# Patient Record
Sex: Female | Born: 1975 | Race: Black or African American | Hispanic: No | Marital: Single | Smoking: Current every day smoker
Health system: Southern US, Community
[De-identification: ages and names within clinical notes are randomized; demographics above are authoritative.]

## PROBLEM LIST (undated history)

## (undated) DIAGNOSIS — J45909 Unspecified asthma, uncomplicated: Secondary | ICD-10-CM

---

## 1997-12-26 ENCOUNTER — Ambulatory Visit (HOSPITAL_COMMUNITY): Admission: RE | Admit: 1997-12-26 | Discharge: 1997-12-26 | Payer: Self-pay | Admitting: Internal Medicine

## 2000-07-23 ENCOUNTER — Encounter: Payer: Self-pay | Admitting: Emergency Medicine

## 2000-07-23 ENCOUNTER — Emergency Department (HOSPITAL_COMMUNITY): Admission: EM | Admit: 2000-07-23 | Discharge: 2000-07-23 | Payer: Self-pay | Admitting: Emergency Medicine

## 2002-07-20 ENCOUNTER — Emergency Department (HOSPITAL_COMMUNITY): Admission: EM | Admit: 2002-07-20 | Discharge: 2002-07-20 | Payer: Self-pay | Admitting: Emergency Medicine

## 2002-10-31 ENCOUNTER — Emergency Department (HOSPITAL_COMMUNITY): Admission: EM | Admit: 2002-10-31 | Discharge: 2002-11-01 | Payer: Self-pay | Admitting: Emergency Medicine

## 2002-10-31 ENCOUNTER — Encounter: Payer: Self-pay | Admitting: Emergency Medicine

## 2003-02-03 ENCOUNTER — Ambulatory Visit (HOSPITAL_COMMUNITY): Admission: RE | Admit: 2003-02-03 | Discharge: 2003-02-03 | Payer: Self-pay | Admitting: Orthopedic Surgery

## 2003-02-03 ENCOUNTER — Encounter: Payer: Self-pay | Admitting: Orthopedic Surgery

## 2003-08-26 ENCOUNTER — Emergency Department (HOSPITAL_COMMUNITY): Admission: EM | Admit: 2003-08-26 | Discharge: 2003-08-27 | Payer: Self-pay | Admitting: Emergency Medicine

## 2004-03-16 ENCOUNTER — Emergency Department (HOSPITAL_COMMUNITY): Admission: EM | Admit: 2004-03-16 | Discharge: 2004-03-16 | Payer: Self-pay | Admitting: Emergency Medicine

## 2004-10-01 ENCOUNTER — Emergency Department (HOSPITAL_COMMUNITY): Admission: EM | Admit: 2004-10-01 | Discharge: 2004-10-01 | Payer: Self-pay | Admitting: Emergency Medicine

## 2005-09-12 ENCOUNTER — Emergency Department (HOSPITAL_COMMUNITY): Admission: EM | Admit: 2005-09-12 | Discharge: 2005-09-12 | Payer: Self-pay | Admitting: Emergency Medicine

## 2006-03-29 ENCOUNTER — Emergency Department (HOSPITAL_COMMUNITY): Admission: EM | Admit: 2006-03-29 | Discharge: 2006-03-29 | Payer: Self-pay | Admitting: Emergency Medicine

## 2006-08-07 ENCOUNTER — Emergency Department (HOSPITAL_COMMUNITY): Admission: EM | Admit: 2006-08-07 | Discharge: 2006-08-07 | Payer: Self-pay | Admitting: Emergency Medicine

## 2008-04-18 ENCOUNTER — Emergency Department (HOSPITAL_COMMUNITY): Admission: EM | Admit: 2008-04-18 | Discharge: 2008-04-19 | Payer: Self-pay | Admitting: Emergency Medicine

## 2008-07-06 ENCOUNTER — Emergency Department (HOSPITAL_COMMUNITY): Admission: EM | Admit: 2008-07-06 | Discharge: 2008-07-06 | Payer: Self-pay | Admitting: Emergency Medicine

## 2009-10-10 ENCOUNTER — Emergency Department (HOSPITAL_COMMUNITY): Admission: EM | Admit: 2009-10-10 | Discharge: 2009-10-10 | Payer: Self-pay | Admitting: Emergency Medicine

## 2010-12-12 ENCOUNTER — Ambulatory Visit (INDEPENDENT_AMBULATORY_CARE_PROVIDER_SITE_OTHER): Payer: BC Managed Care – PPO

## 2010-12-12 ENCOUNTER — Inpatient Hospital Stay (INDEPENDENT_AMBULATORY_CARE_PROVIDER_SITE_OTHER)
Admission: RE | Admit: 2010-12-12 | Discharge: 2010-12-12 | Disposition: A | Discharge status: Home / Self Care | Payer: BC Managed Care – PPO | Source: Ambulatory Visit | Attending: Emergency Medicine | Admitting: Emergency Medicine

## 2010-12-12 DIAGNOSIS — R0789 Other chest pain: Secondary | ICD-10-CM

## 2010-12-12 LAB — POCT PREGNANCY, URINE: Preg Test, Ur: NEGATIVE

## 2011-05-14 LAB — RAPID STREP SCREEN (MED CTR MEBANE ONLY): Streptococcus, Group A Screen (Direct): NEGATIVE

## 2013-04-08 ENCOUNTER — Emergency Department (HOSPITAL_COMMUNITY)
Admission: EM | Admit: 2013-04-08 | Discharge: 2013-04-09 | Disposition: A | Discharge status: Home / Self Care | Payer: BC Managed Care – PPO | Attending: Emergency Medicine | Admitting: Emergency Medicine

## 2013-04-08 DIAGNOSIS — M722 Plantar fascial fibromatosis: Secondary | ICD-10-CM

## 2013-04-08 HISTORY — DX: Unspecified asthma, uncomplicated: J45.909

## 2013-04-08 NOTE — ED Notes (Signed)
Pt. From home with complaint of pain of right foot at she woke up this evening , denied fall but broke same foot  5 years ago. No swelling nor redness noted.

## 2013-04-08 NOTE — ED Provider Notes (Signed)
CSN: [id number]     Arrival date & time 04/08/13  2335 History   First MD Initiated Contact with Patient 04/08/13 2348     Chief Complaint  Patient presents with  . Foot Pain   (Consider location/radiation/quality/duration/timing/severity/associated sxs/prior Treatment) HPI  Sharon Faulkner is a 37 y.o. female complaining of atraumatic pain to the sole of the right foot rated at 9/10 described as sharp it radiates up the leg it is exacerbated by weightbearing. She notices this pain when she first wakes up the steps foot on the ground. Patient has prior trauma to the ankle, not requiring surgery but she thinks that she broke a bone. She denies numbness, weakness, paresthesia  No past medical history on file. No past surgical history on file. No family history on file. History  Substance Use Topics  . Smoking status: Not on file  . Smokeless tobacco: Not on file  . Alcohol Use: Not on file   OB History   No data available     Review of Systems  10 systems reviewed and found to be negative, except as noted in the HPI   Allergies  Review of patient's allergies indicates not on file.  Home Medications  No current outpatient prescriptions on file. There were no vitals taken for this visit. Physical Exam  Nursing note and vitals reviewed. Constitutional: She is oriented to person, place, and time. She appears well-developed and well-nourished. No distress.  HENT:  Head: Normocephalic.  Eyes: Conjunctivae and EOM are normal.  Cardiovascular: Normal rate.   Pulmonary/Chest: Effort normal. No stridor.  Musculoskeletal: Normal range of motion.       Feet:  Neurovascularly intact  Neurological: She is alert and oriented to person, place, and time.  Psychiatric: She has a normal mood and affect.    ED Course  Procedures (including critical care time) Labs Review Labs Reviewed - No data to display Imaging Review Dg Foot Complete Right  04/09/2013   *RADIOLOGY REPORT*  Clinical  Data: Numbness and pain across metatarsals.  No known injury.  RIGHT FOOT COMPLETE - 3+ VIEW  Comparison: None.  Findings: No acute osseous abnormality such as fracture or malalignment.  No radiodense foreign body.  No focally advanced joint narrowing. Non eroded plantar calcaneal spur.  IMPRESSION: Negative right foot radiographs.   Original Report Authenticated By: Tiburcio Pea    MDM  No diagnosis found. Filed Vitals:   04/09/13 0000 04/09/13 0006 04/09/13 0101  BP:  166/109 161/102  Pulse: 85  87  Temp: 99.2 F (37.3 C)    TempSrc: Oral    Resp: 18  18  Height: 5\' 4"  (1.626 m)    Weight: 290 lb (131.543 kg)    SpO2: 100%  97%     Sharon Faulkner is a 37 y.o. female atraumatic right foot pain in the volar side. Plain film is negative, likely plantar fasciitis, counseled patient on how to do stretching exercises. Orthopedic referral given.  Medications  ibuprofen (ADVIL,MOTRIN) tablet 600 mg (600 mg Oral Given 04/09/13 0012)    Pt is hemodynamically stable, appropriate for, and amenable to discharge at this time. Pt verbalized understanding and agrees with care plan. All questions answered. Outpatient follow-up and specific return precautions discussed.    New Prescriptions   TRAMADOL (ULTRAM) 50 MG TABLET    Take 1 tablet (50 mg total) by mouth every 6 (six) hours as needed for pain.    Note: Portions of this report may have been transcribed using voice  recognition software. Every effort was made to ensure accuracy; however, inadvertent computerized transcription errors may be present      Wynetta Emery, [name] 04/09/13 0112

## 2013-04-09 ENCOUNTER — Encounter (HOSPITAL_COMMUNITY): Payer: Self-pay | Admitting: Emergency Medicine

## 2013-04-09 ENCOUNTER — Emergency Department (HOSPITAL_COMMUNITY): Payer: BC Managed Care – PPO

## 2013-04-09 MED ORDER — TRAMADOL HCL 50 MG PO TABS: 50.0000 mg | ORAL_TABLET | Freq: Four times a day (QID) | ORAL | Status: DC | PRN

## 2013-04-09 MED ORDER — IBUPROFEN 200 MG PO TABS
600.0000 mg | ORAL_TABLET | Freq: Once | ORAL | Status: AC
  Administered 2013-04-09: 600 mg via ORAL
  Filled 2013-04-09: qty 3

## 2013-04-09 NOTE — ED Provider Notes (Signed)
Medical screening examination/treatment/procedure(s) were performed by non-physician practitioner and as supervising physician I was immediately available for consultation/collaboration.  Reeve Mallo M Hanan Moen, MD 04/09/13 0618 

## 2015-11-18 DIAGNOSIS — R350 Frequency of micturition: Secondary | ICD-10-CM | POA: Diagnosis not present

## 2016-03-16 DIAGNOSIS — B349 Viral infection, unspecified: Secondary | ICD-10-CM | POA: Diagnosis not present

## 2016-05-15 DIAGNOSIS — R21 Rash and other nonspecific skin eruption: Secondary | ICD-10-CM | POA: Diagnosis not present

## 2016-05-31 DIAGNOSIS — M549 Dorsalgia, unspecified: Secondary | ICD-10-CM | POA: Diagnosis not present

## 2016-06-11 DIAGNOSIS — M62830 Muscle spasm of back: Secondary | ICD-10-CM | POA: Diagnosis not present

## 2016-08-06 DIAGNOSIS — I10 Essential (primary) hypertension: Secondary | ICD-10-CM | POA: Diagnosis not present

## 2016-08-06 DIAGNOSIS — M545 Low back pain: Secondary | ICD-10-CM | POA: Diagnosis not present

## 2016-08-06 DIAGNOSIS — G8929 Other chronic pain: Secondary | ICD-10-CM | POA: Diagnosis not present

## 2017-04-30 DIAGNOSIS — J018 Other acute sinusitis: Secondary | ICD-10-CM | POA: Diagnosis not present

## 2017-04-30 DIAGNOSIS — F17200 Nicotine dependence, unspecified, uncomplicated: Secondary | ICD-10-CM | POA: Diagnosis not present

## 2018-08-12 DIAGNOSIS — J22 Unspecified acute lower respiratory infection: Secondary | ICD-10-CM | POA: Diagnosis not present

## 2018-08-12 DIAGNOSIS — I10 Essential (primary) hypertension: Secondary | ICD-10-CM | POA: Diagnosis not present

## 2018-08-12 DIAGNOSIS — I868 Varicose veins of other specified sites: Secondary | ICD-10-CM | POA: Diagnosis not present

## 2018-08-12 DIAGNOSIS — R600 Localized edema: Secondary | ICD-10-CM | POA: Diagnosis not present

## 2018-10-24 DIAGNOSIS — J209 Acute bronchitis, unspecified: Secondary | ICD-10-CM | POA: Diagnosis not present

## 2019-02-05 DIAGNOSIS — R197 Diarrhea, unspecified: Secondary | ICD-10-CM | POA: Diagnosis not present

## 2019-02-05 DIAGNOSIS — R110 Nausea: Secondary | ICD-10-CM | POA: Diagnosis not present

## 2019-02-05 DIAGNOSIS — J4520 Mild intermittent asthma, uncomplicated: Secondary | ICD-10-CM | POA: Diagnosis not present

## 2019-02-07 DIAGNOSIS — Z20828 Contact with and (suspected) exposure to other viral communicable diseases: Secondary | ICD-10-CM | POA: Diagnosis not present

## 2020-06-20 ENCOUNTER — Emergency Department (HOSPITAL_COMMUNITY)
Admission: EM | Admit: 2020-06-20 | Discharge: 2020-06-21 | Disposition: A | Discharge status: Home / Self Care | Payer: Self-pay | Attending: Emergency Medicine | Admitting: Emergency Medicine

## 2020-06-20 DIAGNOSIS — R519 Headache, unspecified: Secondary | ICD-10-CM | POA: Insufficient documentation

## 2020-06-20 DIAGNOSIS — D75839 Thrombocytosis, unspecified: Secondary | ICD-10-CM | POA: Insufficient documentation

## 2020-06-20 DIAGNOSIS — J45909 Unspecified asthma, uncomplicated: Secondary | ICD-10-CM | POA: Insufficient documentation

## 2020-06-20 DIAGNOSIS — D509 Iron deficiency anemia, unspecified: Secondary | ICD-10-CM | POA: Insufficient documentation

## 2020-06-20 LAB — BASIC METABOLIC PANEL
Anion gap: 10 (ref 5–15)
BUN: 10 mg/dL (ref 6–20)
CO2: 24 mmol/L (ref 22–32)
Calcium: 9.2 mg/dL (ref 8.9–10.3)
Chloride: 105 mmol/L (ref 98–111)
Creatinine, Ser: 0.88 mg/dL (ref 0.44–1.00)
GFR, Estimated: >60 mL/min (ref >60)
Glucose, Bld: 104 mg/dL — ABNORMAL HIGH (ref 70–99)
Potassium: 3.8 mmol/L (ref 3.5–5.1)
Sodium: 139 mmol/L (ref 135–145)

## 2020-06-20 LAB — CBC
HCT: 30.5 % — ABNORMAL LOW (ref 36.0–46.0)
Hemoglobin: 8.4 g/dL — ABNORMAL LOW (ref 12.0–15.0)
MCH: 18.9 pg — ABNORMAL LOW (ref 26.0–34.0)
MCHC: 27.5 g/dL — ABNORMAL LOW (ref 30.0–36.0)
MCV: 68.5 fL — ABNORMAL LOW (ref 80.0–100.0)
Platelets: 430 10*3/uL — ABNORMAL HIGH (ref 150–400)
RBC: 4.45 MIL/uL (ref 3.87–5.11)
RDW: 19.8 % — ABNORMAL HIGH (ref 11.5–15.5)
WBC: 7.3 10*3/uL (ref 4.0–10.5)
nRBC: 0 % (ref 0.0–0.2)

## 2020-06-20 LAB — URINALYSIS, ROUTINE W REFLEX MICROSCOPIC
Bilirubin Urine: NEGATIVE
Glucose, UA: NEGATIVE mg/dL
Hgb urine dipstick: NEGATIVE
Ketones, ur: NEGATIVE mg/dL
Leukocytes,Ua: NEGATIVE
Nitrite: NEGATIVE
Protein, ur: NEGATIVE mg/dL
Specific Gravity, Urine: 1.023 (ref 1.005–1.030)
pH: 5 (ref 5.0–8.0)

## 2020-06-20 LAB — I-STAT BETA HCG BLOOD, ED (MC, WL, AP ONLY): I-stat hCG, quantitative: <5 m[IU]/mL (ref <5)

## 2020-06-20 MED ORDER — DEXAMETHASONE SODIUM PHOSPHATE 10 MG/ML IJ SOLN: 10.0000 mg | Freq: Once | INTRAMUSCULAR | Status: DC

## 2020-06-20 MED ORDER — DIPHENHYDRAMINE HCL 50 MG/ML IJ SOLN: 25.0000 mg | Freq: Once | INTRAMUSCULAR | Status: DC

## 2020-06-20 MED ORDER — PROCHLORPERAZINE EDISYLATE 10 MG/2ML IJ SOLN
10.0000 mg | Freq: Once | INTRAMUSCULAR | Status: AC
  Administered 2020-06-20: 10 mg via INTRAMUSCULAR
  Filled 2020-06-20: qty 2

## 2020-06-20 MED ORDER — DIPHENHYDRAMINE HCL 50 MG/ML IJ SOLN
25.0000 mg | Freq: Once | INTRAMUSCULAR | Status: AC
  Administered 2020-06-20: 25 mg via INTRAMUSCULAR
  Filled 2020-06-20: qty 1

## 2020-06-20 MED ORDER — LACTATED RINGERS IV BOLUS: 1000.0000 mL | Freq: Once | INTRAVENOUS | Status: DC

## 2020-06-20 MED ORDER — DEXAMETHASONE SODIUM PHOSPHATE 10 MG/ML IJ SOLN
10.0000 mg | Freq: Once | INTRAMUSCULAR | Status: AC
  Administered 2020-06-20: 10 mg via INTRAMUSCULAR
  Filled 2020-06-20: qty 1

## 2020-06-20 MED ORDER — PROCHLORPERAZINE EDISYLATE 10 MG/2ML IJ SOLN: 10.0000 mg | Freq: Once | INTRAMUSCULAR | Status: DC

## 2020-06-20 NOTE — ED Provider Notes (Signed)
[facility] EMERGENCY DEPARTMENT Provider Note   CSN: [id number] Arrival date & time: 06/20/20  2014   History Chief Complaint  Patient presents with  . Headache    Sharon Faulkner is a 44 y.o. female.  The history is provided by the patient.  Headache She has history of asthma and comes in complaining of a bitemporal headache.  Headache started 2 days ago and has been constant.  She describes a dull, throbbing pain which has been as severe as 10/10, but is now at 8/10.  It is worse with exposure to bright light but not to noise or smells.  She has had flashes of lights that she sees, but she denies nausea or vomiting.  There has been no numbness or weakness.  She has taken naproxen and aspirin without relief.  She had a similar headache about 2 weeks ago, but had not had similar headaches prior to that.  She denies fever or chills.  Past Medical History:  Diagnosis Date  . Asthma     There are no problems to display for this patient.   ** The histories are not reviewed yet. Please review them in the "History" navigator section and refresh this SmartLink.   OB History   No obstetric history on file.     No family history on file.  Social History   Tobacco Use  . Smoking status: Never Smoker  Substance Use Topics  . Alcohol use: Not on file    Comment: occasional  . Drug use: No    Home Medications Prior to Admission medications   Medication Sig Start Date End Date Taking? Authorizing Provider  acetaminophen (TYLENOL) 500 MG tablet Take 500 mg by mouth every 6 (six) hours as needed for pain.    [provider]  traMADol (ULTRAM) 50 MG tablet Take 1 tablet (50 mg total) by mouth every 6 (six) hours as needed for pain. 04/09/13   Pisciotta, Joni Reining, [name]    Allergies    Shellfish allergy and Codeine  Review of Systems   Review of Systems  Neurological: Positive for headaches.  All other systems reviewed and are negative.   Physical  Exam Updated Vital Signs BP (!) 153/99   Pulse 98   Temp 98.8 F (37.1 C) (Oral)   Resp 16   SpO2 99%   Physical Exam Vitals and nursing note reviewed.   44 year old female, resting comfortably and in no acute distress. Vital signs are significant for elevated blood pressure. Oxygen saturation is 99%, which is normal. Head is normocephalic and atraumatic. PERRLA, EOMI. Oropharynx is clear.  There is tenderness to palpation over the temporalis muscles bilaterally and over the insertion of the paracervical muscles bilaterally. Neck is nontender and supple without adenopathy or JVD. Back is nontender and there is no CVA tenderness. Lungs are clear without rales, wheezes, or rhonchi. Chest is nontender. Heart has regular rate and rhythm without murmur. Abdomen is soft, flat, nontender without masses or hepatosplenomegaly and peristalsis is normoactive. Extremities have no cyanosis or edema, full range of motion is present. Skin is warm and dry without rash. Neurologic: Mental status is normal, cranial nerves are intact, there are no motor or sensory deficits.  ED Results / Procedures / Treatments   Labs (all labs ordered are listed, but only abnormal results are displayed) Labs Reviewed  BASIC METABOLIC PANEL - Abnormal; Notable for the following components:      Result Value   Glucose, Bld 104 (*)  All other components within normal limits  CBC - Abnormal; Notable for the following components:   Hemoglobin 8.4 (*)    HCT 30.5 (*)    MCV 68.5 (*)    MCH 18.9 (*)    MCHC 27.5 (*)    RDW 19.8 (*)    Platelets 430 (*)    All other components within normal limits  URINALYSIS, ROUTINE W REFLEX MICROSCOPIC  I-STAT BETA HCG BLOOD, ED ([facility], [facility], [facility] ONLY)  CBG MONITORING, ED    EKG EKG Interpretation  Date/Time:  Wednesday June 20 2020 20:32:03 EST Ventricular Rate:  92 PR Interval:  156 QRS Duration: 138 QT Interval:  364 QTC Calculation: 450 R Axis:   80 Text  Interpretation: Normal sinus rhythm Right bundle branch block Abnormal ECG When compared with ECG of 12/12/2010, Right bundle branch block is now present Confirmed by Dione Booze ([id number]) on 06/20/2020 11:13:52 PM   Radiology No results found.  Procedures Procedures  Medications Ordered in ED Medications  diphenhydrAMINE (BENADRYL) injection 25 mg (25 mg Intramuscular Given 06/20/20 2351)  dexamethasone (DECADRON) injection 10 mg (10 mg Intramuscular Given 06/20/20 2350)  prochlorperazine (COMPAZINE) injection 10 mg (10 mg Intramuscular Given 06/20/20 2351)    ED Course  I have reviewed the triage vital signs and the nursing notes.  Pertinent lab results that were available during my care of the patient were reviewed by me and considered in my medical decision making (see chart for details).  MDM Rules/Calculators/A&P Headache with exam strongly suggestive of muscle contraction headache.  Possible migraine variant.  No red flags to suggest serious causes of headache such as meningitis, subarachnoid hemorrhage.  Old records are reviewed, and she has no relevant past visits.  ECG shows new right bundle branch block, but prior ECG was from 9 years ago.  Labs show microcytic anemia with no prior labs available.  Also, mild thrombocytosis of uncertain significance.  Anemia is most likely iron deficiency but also need to consider possibility of sickle cell trait.  I recommended a headache cocktail but patient states she does not wish to have an IV done.  Accordingly, she is given injections of prochlorperazine, diphenhydramine, dexamethasone and will be observed.  She had good relief of headache with above-noted treatment.  She is discharged with prescription for prochlorperazine and also for ferrous sulfate.  Advised to apply ice to her tender areas as an adjunct to medication, use over-the-counter NSAIDs and acetaminophen as needed.  Return precautions discussed.  Final Clinical Impression(s) /  ED Diagnoses Final diagnoses:  Bad headache  Microcytic anemia  Thrombocytosis    Rx / DC Orders ED Discharge Orders         Ordered    prochlorperazine (COMPAZINE) 10 MG tablet  Every 6 hours PRN        06/21/20 0100    ferrous sulfate 325 (65 FE) MG tablet  Daily        06/21/20 0100           Dione Booze, MD 06/21/20 0102

## 2020-06-20 NOTE — ED Triage Notes (Signed)
Pt states that she has had a migraine since Monday unrelieved by OTC meds. Photosensitivity, denies nausea. Pt states that she has also been having intermittent dizziness, neruo intact bilaterally

## 2020-06-21 MED ORDER — PROCHLORPERAZINE MALEATE 10 MG PO TABS: 10.0000 mg | ORAL_TABLET | Freq: Four times a day (QID) | ORAL | 0 refills | Status: DC | PRN

## 2020-06-21 MED ORDER — FERROUS SULFATE 325 (65 FE) MG PO TABS: 325.0000 mg | ORAL_TABLET | Freq: Every day | ORAL | 0 refills | Status: DC

## 2020-06-21 NOTE — Discharge Instructions (Addendum)
Apply ice to the sore areas as needed.  Take acetaminophen and ibuprofen as needed for pain.  Return if you are having any problems.

## 2020-07-16 ENCOUNTER — Encounter (HOSPITAL_COMMUNITY): Payer: Self-pay

## 2020-07-16 ENCOUNTER — Inpatient Hospital Stay (HOSPITAL_COMMUNITY)
Admission: EM | Admit: 2020-07-16 | Discharge: 2020-07-22 | DRG: 177 | Disposition: A | Discharge status: Home / Self Care | Payer: HRSA Program | Attending: Family Medicine | Admitting: Family Medicine

## 2020-07-16 ENCOUNTER — Emergency Department (HOSPITAL_COMMUNITY): Payer: HRSA Program

## 2020-07-16 ENCOUNTER — Other Ambulatory Visit: Payer: Self-pay

## 2020-07-16 DIAGNOSIS — F1210 Cannabis abuse, uncomplicated: Secondary | ICD-10-CM | POA: Diagnosis present

## 2020-07-16 DIAGNOSIS — Z91013 Allergy to seafood: Secondary | ICD-10-CM

## 2020-07-16 DIAGNOSIS — Z79899 Other long term (current) drug therapy: Secondary | ICD-10-CM | POA: Diagnosis not present

## 2020-07-16 DIAGNOSIS — I4510 Unspecified right bundle-branch block: Secondary | ICD-10-CM | POA: Diagnosis present

## 2020-07-16 DIAGNOSIS — J1282 Pneumonia due to coronavirus disease 2019: Secondary | ICD-10-CM | POA: Diagnosis present

## 2020-07-16 DIAGNOSIS — I10 Essential (primary) hypertension: Secondary | ICD-10-CM | POA: Diagnosis present

## 2020-07-16 DIAGNOSIS — F1010 Alcohol abuse, uncomplicated: Secondary | ICD-10-CM | POA: Diagnosis present

## 2020-07-16 DIAGNOSIS — D509 Iron deficiency anemia, unspecified: Secondary | ICD-10-CM

## 2020-07-16 DIAGNOSIS — F419 Anxiety disorder, unspecified: Secondary | ICD-10-CM | POA: Diagnosis present

## 2020-07-16 DIAGNOSIS — R0603 Acute respiratory distress: Secondary | ICD-10-CM | POA: Diagnosis present

## 2020-07-16 DIAGNOSIS — Z87892 Personal history of anaphylaxis: Secondary | ICD-10-CM

## 2020-07-16 DIAGNOSIS — J9601 Acute respiratory failure with hypoxia: Secondary | ICD-10-CM | POA: Diagnosis present

## 2020-07-16 DIAGNOSIS — I2699 Other pulmonary embolism without acute cor pulmonale: Secondary | ICD-10-CM | POA: Diagnosis not present

## 2020-07-16 DIAGNOSIS — J9600 Acute respiratory failure, unspecified whether with hypoxia or hypercapnia: Secondary | ICD-10-CM | POA: Diagnosis present

## 2020-07-16 DIAGNOSIS — Z91018 Allergy to other foods: Secondary | ICD-10-CM | POA: Diagnosis not present

## 2020-07-16 DIAGNOSIS — E876 Hypokalemia: Secondary | ICD-10-CM | POA: Diagnosis present

## 2020-07-16 DIAGNOSIS — R7989 Other specified abnormal findings of blood chemistry: Secondary | ICD-10-CM | POA: Diagnosis not present

## 2020-07-16 DIAGNOSIS — I361 Nonrheumatic tricuspid (valve) insufficiency: Secondary | ICD-10-CM | POA: Diagnosis not present

## 2020-07-16 DIAGNOSIS — F40240 Claustrophobia: Secondary | ICD-10-CM | POA: Diagnosis present

## 2020-07-16 DIAGNOSIS — F17210 Nicotine dependence, cigarettes, uncomplicated: Secondary | ICD-10-CM | POA: Diagnosis present

## 2020-07-16 DIAGNOSIS — Z6841 Body Mass Index (BMI) 40.0 and over, adult: Secondary | ICD-10-CM | POA: Diagnosis not present

## 2020-07-16 DIAGNOSIS — Z885 Allergy status to narcotic agent status: Secondary | ICD-10-CM

## 2020-07-16 DIAGNOSIS — U071 COVID-19: Secondary | ICD-10-CM | POA: Diagnosis present

## 2020-07-16 LAB — CBC
HCT: 32.8 % — ABNORMAL LOW (ref 36.0–46.0)
Hemoglobin: 9.1 g/dL — ABNORMAL LOW (ref 12.0–15.0)
MCH: 19 pg — ABNORMAL LOW (ref 26.0–34.0)
MCHC: 27.7 g/dL — ABNORMAL LOW (ref 30.0–36.0)
MCV: 68.3 fL — ABNORMAL LOW (ref 80.0–100.0)
Platelets: 348 10*3/uL (ref 150–400)
RBC: 4.8 MIL/uL (ref 3.87–5.11)
RDW: 21.8 % — ABNORMAL HIGH (ref 11.5–15.5)
WBC: 5.7 10*3/uL (ref 4.0–10.5)
nRBC: 0 % (ref 0.0–0.2)

## 2020-07-16 LAB — FERRITIN: Ferritin: 56 ng/mL (ref 11–307)

## 2020-07-16 LAB — I-STAT BETA HCG BLOOD, ED (MC, WL, AP ONLY): I-stat hCG, quantitative: <5 m[IU]/mL (ref <5)

## 2020-07-16 LAB — RESP PANEL BY RT-PCR (FLU A&B, COVID) ARPGX2
Influenza A by PCR: NEGATIVE
Influenza B by PCR: NEGATIVE
SARS Coronavirus 2 by RT PCR: POSITIVE — AB

## 2020-07-16 LAB — BASIC METABOLIC PANEL
Anion gap: 8 (ref 5–15)
BUN: 9 mg/dL (ref 6–20)
CO2: 29 mmol/L (ref 22–32)
Calcium: 8.4 mg/dL — ABNORMAL LOW (ref 8.9–10.3)
Chloride: 98 mmol/L (ref 98–111)
Creatinine, Ser: 0.77 mg/dL (ref 0.44–1.00)
GFR, Estimated: >60 mL/min (ref >60)
Glucose, Bld: 113 mg/dL — ABNORMAL HIGH (ref 70–99)
Potassium: 3.1 mmol/L — ABNORMAL LOW (ref 3.5–5.1)
Sodium: 135 mmol/L (ref 135–145)

## 2020-07-16 LAB — LACTATE DEHYDROGENASE: LDH: 423 U/L — ABNORMAL HIGH (ref 98–192)

## 2020-07-16 LAB — TRIGLYCERIDES: Triglycerides: 91 mg/dL (ref <150)

## 2020-07-16 LAB — FIBRINOGEN: Fibrinogen: 521 mg/dL — ABNORMAL HIGH (ref 210–475)

## 2020-07-16 LAB — PROCALCITONIN: Procalcitonin: <0.1 ng/mL

## 2020-07-16 LAB — LACTIC ACID, PLASMA
Lactic Acid, Venous: 0.9 mmol/L (ref 0.5–1.9)
Lactic Acid, Venous: 1 mmol/L (ref 0.5–1.9)

## 2020-07-16 LAB — D-DIMER, QUANTITATIVE: D-Dimer, Quant: 2.98 ug/mL-FEU — ABNORMAL HIGH (ref 0.00–0.50)

## 2020-07-16 LAB — C-REACTIVE PROTEIN: CRP: 3.9 mg/dL — ABNORMAL HIGH (ref <1.0)

## 2020-07-16 MED ORDER — ONDANSETRON HCL 4 MG PO TABS: 4.0000 mg | ORAL_TABLET | Freq: Four times a day (QID) | ORAL | Status: DC | PRN

## 2020-07-16 MED ORDER — PROCHLORPERAZINE MALEATE 10 MG PO TABS: 10.0000 mg | ORAL_TABLET | Freq: Four times a day (QID) | ORAL | Status: DC | PRN

## 2020-07-16 MED ORDER — SODIUM CHLORIDE 0.9 % IV SOLN
200.0000 mg | Freq: Once | INTRAVENOUS | Status: AC
  Administered 2020-07-16: 200 mg via INTRAVENOUS
  Filled 2020-07-16: qty 200

## 2020-07-16 MED ORDER — FERROUS SULFATE 325 (65 FE) MG PO TABS
325.0000 mg | ORAL_TABLET | Freq: Every day | ORAL | Status: DC
  Administered 2020-07-17 – 2020-07-22 (×6): 325 mg via ORAL
  Filled 2020-07-16 (×6): qty 1

## 2020-07-16 MED ORDER — SENNOSIDES-DOCUSATE SODIUM 8.6-50 MG PO TABS: 1.0000 | ORAL_TABLET | Freq: Every evening | ORAL | Status: DC | PRN

## 2020-07-16 MED ORDER — GUAIFENESIN-DM 100-10 MG/5ML PO SYRP
5.0000 mL | ORAL_SOLUTION | ORAL | Status: DC | PRN
  Administered 2020-07-16 – 2020-07-22 (×13): 5 mL via ORAL
  Filled 2020-07-16 (×13): qty 10

## 2020-07-16 MED ORDER — POTASSIUM CHLORIDE CRYS ER 20 MEQ PO TBCR
40.0000 meq | EXTENDED_RELEASE_TABLET | Freq: Two times a day (BID) | ORAL | Status: DC
  Administered 2020-07-16 – 2020-07-18 (×4): 40 meq via ORAL
  Filled 2020-07-16 (×4): qty 2

## 2020-07-16 MED ORDER — BARICITINIB 2 MG PO TABS
4.0000 mg | ORAL_TABLET | Freq: Every day | ORAL | Status: DC
  Administered 2020-07-16 – 2020-07-22 (×7): 4 mg via ORAL
  Filled 2020-07-16 (×8): qty 2

## 2020-07-16 MED ORDER — SODIUM CHLORIDE 0.9 % IV SOLN
100.0000 mg | Freq: Every day | INTRAVENOUS | Status: AC
  Administered 2020-07-17 – 2020-07-20 (×4): 100 mg via INTRAVENOUS
  Filled 2020-07-16 (×5): qty 20

## 2020-07-16 MED ORDER — IPRATROPIUM-ALBUTEROL 20-100 MCG/ACT IN AERS
1.0000 | INHALATION_SPRAY | Freq: Four times a day (QID) | RESPIRATORY_TRACT | Status: DC
  Administered 2020-07-16 – 2020-07-22 (×21): 1 via RESPIRATORY_TRACT
  Filled 2020-07-16 (×2): qty 4

## 2020-07-16 MED ORDER — METHYLPREDNISOLONE SODIUM SUCC 125 MG IJ SOLR
1.0000 mg/kg | Freq: Two times a day (BID) | INTRAMUSCULAR | Status: DC
  Administered 2020-07-16 – 2020-07-18 (×4): 113.75 mg via INTRAVENOUS
  Filled 2020-07-16 (×4): qty 2

## 2020-07-16 MED ORDER — ENOXAPARIN SODIUM 60 MG/0.6ML ~~LOC~~ SOLN
55.0000 mg | SUBCUTANEOUS | Status: DC
  Filled 2020-07-16 (×4): qty 0.6

## 2020-07-16 MED ORDER — MAGNESIUM SULFATE 2 GM/50ML IV SOLN
2.0000 g | Freq: Once | INTRAVENOUS | Status: AC
  Administered 2020-07-16: 2 g via INTRAVENOUS
  Filled 2020-07-16: qty 50

## 2020-07-16 MED ORDER — ONDANSETRON HCL 4 MG/2ML IJ SOLN: 4.0000 mg | Freq: Four times a day (QID) | INTRAMUSCULAR | Status: DC | PRN

## 2020-07-16 MED ORDER — ALBUTEROL SULFATE HFA 108 (90 BASE) MCG/ACT IN AERS
5.0000 | INHALATION_SPRAY | Freq: Once | RESPIRATORY_TRACT | Status: AC
  Administered 2020-07-16: 5 via RESPIRATORY_TRACT
  Filled 2020-07-16: qty 6.7

## 2020-07-16 MED ORDER — IPRATROPIUM BROMIDE HFA 17 MCG/ACT IN AERS
2.0000 | INHALATION_SPRAY | Freq: Once | RESPIRATORY_TRACT | Status: AC
  Administered 2020-07-16: 2 via RESPIRATORY_TRACT
  Filled 2020-07-16: qty 12.9

## 2020-07-16 MED ORDER — ACETAMINOPHEN 325 MG PO TABS
650.0000 mg | ORAL_TABLET | Freq: Four times a day (QID) | ORAL | Status: DC | PRN
  Administered 2020-07-18: 650 mg via ORAL
  Filled 2020-07-16: qty 2

## 2020-07-16 MED ORDER — PREDNISONE 20 MG PO TABS: 50.0000 mg | ORAL_TABLET | Freq: Every day | ORAL | Status: DC

## 2020-07-16 MED ORDER — METHYLPREDNISOLONE SODIUM SUCC 125 MG IJ SOLR
125.0000 mg | Freq: Once | INTRAMUSCULAR | Status: AC
  Administered 2020-07-16: 125 mg via INTRAVENOUS
  Filled 2020-07-16: qty 2

## 2020-07-16 NOTE — ED Notes (Addendum)
CRITICAL VALUE ALERT  Critical Value:  COVID positive  Date & Time Notied:  07/16/2020 1410  Provider Notified: Pilar Plate, EDP  Orders Received/Actions taken: Acknowledged

## 2020-07-16 NOTE — ED Triage Notes (Addendum)
Pt arrived via EMS , c/o SOB, cough x1 week. Went to UC, spo2 70's-80's RA. Pt audibly wheezing. SPo2 87% 4L in triage. Denies any CP, fevers, chills. Denies any sick contacts. Hx of asthma. Albuterol inhalers at home with little relief.

## 2020-07-16 NOTE — ED Provider Notes (Signed)
[facility]-EMERGENCY DEPT Provider Note   CSN: [id number] Arrival date & time: 07/16/20  1112     History Chief Complaint  Patient presents with  . Shortness of Breath    Sharon Faulkner is a 44 y.o. female with pertinent past medical history of asthma that presents the emergency department today for shortness of breath.  Patient arrived by EMS, went to urgent care today with oxygen saturation in the 70s, was sent to ER for further evaluation.  Patient states that she think she is having an asthma exacerbation, feels similar.  Patient states for the past week she has been having increased shortness of breath and increased work of breathing, also admits to cough which is productive.  She also states that she has been having some myalgias, denies any fevers, chills, nausea, vomiting, chest pain.  Denies any sick contacts.  States that she has not been vaccinated against Covid.  States that she has had to stay out of work for the past week due to her shortness of breath.  States that she has been trying to use her albuterol inhaler at home with minimal relief.  Denies any congestion, minimal sore throat after coughing, facial pain.  States that she was in normal health before this.  States that she gets asthma about 3-4 times a year, no specific trigger.  States that she is generally healthy.  Eating and drinking normally.  Is not on any oxygen at home.  HPI     Past Medical History:  Diagnosis Date  . Asthma     There are no problems to display for this patient.   History reviewed. No pertinent surgical history.   OB History   No obstetric history on file.     History reviewed. No pertinent family history.  Social History   Tobacco Use  . Smoking status: Current Every Day Smoker    Packs/day: 0.50    Types: Cigarettes  . Smokeless tobacco: Never Used  Substance Use Topics  . Alcohol use: Not on file    Comment: occasional  . Drug use: No    Home  Medications Prior to Admission medications   Medication Sig Start Date End Date Taking? Authorizing Provider  acetaminophen (TYLENOL) 500 MG tablet Take 500 mg by mouth every 6 (six) hours as needed for pain.    [provider]  ferrous sulfate 325 (65 FE) MG tablet Take 1 tablet (325 mg total) by mouth daily. 06/21/20   Dione BoozeGlick, David, MD  prochlorperazine (COMPAZINE) 10 MG tablet Take 1 tablet (10 mg total) by mouth every 6 (six) hours as needed for nausea or vomiting (or headache). 06/21/20   Dione BoozeGlick, David, MD    Allergies    Shellfish allergy, Codeine, and Mango flavor  Review of Systems   Review of Systems  Constitutional: Negative for chills, diaphoresis, fatigue and fever.  HENT: Negative for congestion, sore throat and trouble swallowing.   Eyes: Negative for pain and visual disturbance.  Respiratory: Positive for cough, shortness of breath and wheezing.   Cardiovascular: Negative for chest pain, palpitations and leg swelling.  Gastrointestinal: Negative for abdominal distention, abdominal pain, diarrhea, nausea and vomiting.  Genitourinary: Negative for difficulty urinating.  Musculoskeletal: Positive for myalgias. Negative for back pain, neck pain and neck stiffness.  Skin: Negative for pallor.  Neurological: Negative for dizziness, speech difficulty, weakness and headaches.  Psychiatric/Behavioral: Negative for confusion.    Physical Exam Updated Vital Signs BP 113/71   Pulse 89  Temp 99.4 F (37.4 C) (Oral)   Resp (!) 29   LMP 07/04/2020   SpO2 92%   Physical Exam Constitutional:      General: She is in acute distress.     Appearance: Normal appearance. She is ill-appearing. She is not toxic-appearing or diaphoretic.     Comments: Patient with respiratory distress, is able to speak to me in short sentences, satting at 93% on 4-1/2 L.  HENT:     Mouth/Throat:     Mouth: Mucous membranes are moist.     Pharynx: Oropharynx is clear.  Eyes:     General: No  scleral icterus.    Extraocular Movements: Extraocular movements intact.     Pupils: Pupils are equal, round, and reactive to light.  Cardiovascular:     Rate and Rhythm: Normal rate and regular rhythm.     Pulses: Normal pulses.     Heart sounds: Normal heart sounds.  Pulmonary:     Effort: Tachypnea and accessory muscle usage present. No respiratory distress.     Breath sounds: No stridor. Wheezing and rhonchi present. No rales.     Comments: Patient is using accessory muscles, expiratory wheezes with rhonchi heard in lower lung fields.  Increased effort.  Chest:     Chest wall: No tenderness.  Abdominal:     General: Abdomen is flat. There is no distension.     Palpations: Abdomen is soft.     Tenderness: There is no abdominal tenderness. There is no guarding or rebound.  Musculoskeletal:        General: No swelling or tenderness. Normal range of motion.     Cervical back: Normal range of motion and neck supple. No rigidity.     Right lower leg: No edema.     Left lower leg: No edema.  Skin:    General: Skin is warm and dry.     Capillary Refill: Capillary refill takes less than 2 seconds.     Coloration: Skin is not pale.  Neurological:     General: No focal deficit present.     Mental Status: She is alert and oriented to person, place, and time.  Psychiatric:        Mood and Affect: Mood normal.        Behavior: Behavior normal.     ED Results / Procedures / Treatments   Labs (all labs ordered are listed, but only abnormal results are displayed) Labs Reviewed  RESP PANEL BY RT-PCR (FLU A&B, COVID) ARPGX2 - Abnormal; Notable for the following components:      Result Value   SARS Coronavirus 2 by RT PCR POSITIVE (*)    All other components within normal limits  BASIC METABOLIC PANEL - Abnormal; Notable for the following components:   Potassium 3.1 (*)    Glucose, Bld 113 (*)    Calcium 8.4 (*)    All other components within normal limits  CBC - Abnormal; Notable for  the following components:   Hemoglobin 9.1 (*)    HCT 32.8 (*)    MCV 68.3 (*)    MCH 19.0 (*)    MCHC 27.7 (*)    RDW 21.8 (*)    All other components within normal limits  LACTIC ACID, PLASMA  LACTIC ACID, PLASMA  D-DIMER, QUANTITATIVE (NOT AT [facility])  PROCALCITONIN  LACTATE DEHYDROGENASE  FERRITIN  TRIGLYCERIDES  FIBRINOGEN  C-REACTIVE PROTEIN  I-STAT BETA HCG BLOOD, ED ([facility], [facility], [facility] ONLY)    EKG EKG Interpretation  Date/Time:  Monday July 16 2020 11:49:51 EST Ventricular Rate:  91 PR Interval:    QRS Duration: 146 QT Interval:  390 QTC Calculation: 480 R Axis:   44 Text Interpretation: Sinus rhythm Right bundle branch block Confirmed by Kennis Carina [id number]) on 07/16/2020 12:37:59 PM   Radiology DG Chest Portable 1 View  Result Date: 07/16/2020 CLINICAL DATA:  Shortness of breath.  Hypoxia. EXAM: PORTABLE CHEST 1 VIEW COMPARISON:  Chest radiographs April 23, 2015. FINDINGS: Low lung volumes with diffuse interstitial prominence and patchy airspace opacities. No visible pleural effusions or pneumothorax. Enlarged cardiac silhouette. No acute osseous abnormality. IMPRESSION: 1. Low lung volumes with diffuse interstitial prominence and patchy airspace opacities. Findings could represent pulmonary edema and/or pneumonia. 2. Cardiomegaly. Electronically Signed   By: Feliberto Harts MD   On: 07/16/2020 12:48    Procedures .Critical Care Performed by: Farrel Gordon, [name] Authorized by: Farrel Gordon, [name]   Critical care provider statement:    Critical care time (minutes):  45   Critical care was necessary to treat or prevent imminent or life-threatening deterioration of the following conditions:  Respiratory failure   Critical care was time spent personally by me on the following activities:  Discussions with consultants, evaluation of patient's response to treatment, examination of patient, ordering and performing treatments and interventions, ordering and review of  laboratory studies, ordering and review of radiographic studies, pulse oximetry, re-evaluation of patient's condition, obtaining history from patient or surrogate and review of old charts   (including critical care time)  Medications Ordered in ED Medications  methylPREDNISolone sodium succinate (SOLU-MEDROL) 125 mg/2 mL injection 125 mg (125 mg Intravenous Given 07/16/20 1238)  magnesium sulfate IVPB 2 g 50 mL ( Intravenous Stopped 07/16/20 1336)  albuterol (VENTOLIN HFA) 108 (90 Base) MCG/ACT inhaler 5 puff (5 puffs Inhalation Given 07/16/20 1238)  ipratropium (ATROVENT HFA) inhaler 2 puff (2 puffs Inhalation Given 07/16/20 1248)    ED Course  I have reviewed the triage vital signs and the nursing notes.  Pertinent labs & imaging results that were available during my care of the patient were reviewed by me and considered in my medical decision making (see chart for details).    MDM Rules/Calculators/A&P                           [place] Evora is a 44 y.o. female with pertinent past medical history of asthma that presents the emergency department today for shortness of breath.  Differential diagnoses considered include Covid, asthma exacerbation, respiratory virus.  Less likely to be PE with other URI symptoms.  Initial interventions albuterol, Atrovent, mag, will wait on DuoNeb until Covid test results.  ECG interpreted by me demonstrated no sings of ischemia.   CXR interpreted by me demonstrated edema versus pneumonia  Labs demonstrated Covid positive, patient will need to be admitted at this time for continuous increased shortness of breath and work of breathing.  216 spoke to Dr. Ronaldo Miyamoto, hospitalist will accept patient.  The patient appears reasonably stabilized for admission considering the current resources, flow, and capabilities available in the ED at this time, and I doubt any other [facility] requiring further screening and/or treatment in the ED prior to admission.  I discussed  this case with my attending physician who cosigned this note including patient's presenting symptoms, physical exam, and planned diagnostics and interventions. Attending physician stated agreement with plan or made changes to plan which were implemented.   Attending  physician assessed patient at bedside.  Final Clinical Impression(s) / ED Diagnoses Final diagnoses:  Respiratory distress  COVID    Rx / DC Orders ED Discharge Orders    None       Farrel Gordon, [name] 07/16/20 1418    Sabas Sous, MD 07/16/20 [phone]

## 2020-07-16 NOTE — H&P (Signed)
History and Physical    Sharon Faulkner QJJ:[id number] DOB: [birth year] DOA: 07/16/2020  PCP: Patient, No Pcp Per  Patient coming from: Home  Chief Complaint: Dyspnea  HPI: Sharon Faulkner is a 44 y.o. female with medical history significant of asthma. Presenting with dyspnea. Reports her symptoms started last Monday. She was having shortness of breath with an unproductive cough. No fever. She tried her inhaler and some OTC meds, but her symptoms persisted til this morning. She went to a [facility] and was assessed by their staff. She was found to be hypoxic and was sent to the ED. She denies any alleviating or aggravating factors. She denies any other treatments.  ED Course: She was found to have diffuse patchy opacities on CXR. She was found to be COVID positive. She was placed on 4L [place]. TRH was called for admission.   Review of Systems:  Denies CP, N, V, palpitations, syncopal episodes.  Review of systems is otherwise negative for all not mentioned in HPI.   PMHx Past Medical History:  Diagnosis Date  . Asthma     PSHx History reviewed. No pertinent surgical history.  SocHx  reports that she has been smoking cigarettes. She has been smoking about 0.50 packs per day. She has never used smokeless tobacco. She reports that she does not use drugs. No history on file for alcohol use.   Mj use almost daily. A fifth of EtOH 2 days out of the week.  Allergies  Allergen Reactions  . Shellfish Allergy Anaphylaxis and Itching  . Codeine Itching  . Mango Flavor     FamHx History reviewed. No pertinent family history.  Prior to Admission medications   Medication Sig Start Date End Date Taking? Authorizing Provider  acetaminophen (TYLENOL) 500 MG tablet Take 500 mg by mouth every 6 (six) hours as needed for pain.    [provider]  albuterol (VENTOLIN HFA) 108 (90 Base) MCG/ACT inhaler Inhale into the lungs. 06/18/20   [provider]  ferrous sulfate 325 (65 FE) MG  tablet Take 1 tablet (325 mg total) by mouth daily. 06/21/20   Dione Booze, MD  prochlorperazine (COMPAZINE) 10 MG tablet Take 1 tablet (10 mg total) by mouth every 6 (six) hours as needed for nausea or vomiting (or headache). 06/21/20   Dione Booze, MD    Physical Exam: Vitals:   07/16/20 1430 07/16/20 1515 07/16/20 1608 07/16/20 1610  BP: 115/70 126/79  117/73  Pulse: 88 90  90  Resp: (!) 32 (!) 40    Temp:    99.8 F (37.7 C)  TempSrc:    Oral  SpO2: 90% 91%  96%  Weight:   113.9 kg   Height:   5\' 4"  (1.626 m)     General: 44 y.o. female resting in bed in NAD Eyes: PERRL, normal sclera ENMT: Nares patent w/o discharge, orophaynx clear, dentition normal, ears w/o discharge/lesions/ulcers Neck: Supple, trachea midline Cardiovascular: RRR, +S1, S2, no m/g/r, equal pulses throughout Respiratory: scattered soft rhonchi, no w/r, normal WOB GI: BS+, NDNT, no masses noted, no organomegaly noted MSK: No e/c/c Skin: No rashes, bruises, ulcerations noted Neuro: A&O x 3, no focal deficits Psyc: Appropriate interaction and affect, calm/cooperative  Labs on Admission: I have personally reviewed following labs and imaging studies  CBC: Recent Labs  Lab 07/16/20 1138  WBC 5.7  HGB 9.1*  HCT 32.8*  MCV 68.3*  PLT 348   Basic Metabolic Panel: Recent Labs  Lab 07/16/20 1138  NA 135  K 3.1*  CL 98  CO2 29  GLUCOSE 113*  BUN 9  CREATININE 0.77  CALCIUM 8.4*   GFR: Estimated Creatinine Clearance: 109.9 mL/min (by C-G formula based on SCr of 0.77 mg/dL). Liver Function Tests: No results for input(s): AST, ALT, ALKPHOS, BILITOT, PROT, ALBUMIN in the last 168 hours. No results for input(s): LIPASE, AMYLASE in the last 168 hours. No results for input(s): AMMONIA in the last 168 hours. Coagulation Profile: No results for input(s): INR, PROTIME in the last 168 hours. Cardiac Enzymes: No results for input(s): CKTOTAL, CKMB, CKMBINDEX, TROPONINI in the last 168 hours. BNP  (last 3 results) No results for input(s): PROBNP in the last 8760 hours. HbA1C: No results for input(s): HGBA1C in the last 72 hours. CBG: No results for input(s): GLUCAP in the last 168 hours. Lipid Profile: Recent Labs    07/16/20 1138  TRIG 91   Thyroid Function Tests: No results for input(s): TSH, T4TOTAL, FREET4, T3FREE, THYROIDAB in the last 72 hours. Anemia Panel: Recent Labs    07/16/20 1138  FERRITIN 56   Urine analysis:    Component Value Date/Time   COLORURINE YELLOW 06/20/2020 2029   APPEARANCEUR CLEAR 06/20/2020 2029   LABSPEC 1.023 06/20/2020 2029   PHURINE 5.0 06/20/2020 2029   GLUCOSEU NEGATIVE 06/20/2020 2029   HGBUR NEGATIVE 06/20/2020 2029   BILIRUBINUR NEGATIVE 06/20/2020 2029   KETONESUR NEGATIVE 06/20/2020 2029   PROTEINUR NEGATIVE 06/20/2020 2029   NITRITE NEGATIVE 06/20/2020 2029   LEUKOCYTESUR NEGATIVE 06/20/2020 2029    Radiological Exams on Admission: DG Chest Portable 1 View  Result Date: 07/16/2020 CLINICAL DATA:  Shortness of breath.  Hypoxia. EXAM: PORTABLE CHEST 1 VIEW COMPARISON:  Chest radiographs April 23, 2015. FINDINGS: Low lung volumes with diffuse interstitial prominence and patchy airspace opacities. No visible pleural effusions or pneumothorax. Enlarged cardiac silhouette. No acute osseous abnormality. IMPRESSION: 1. Low lung volumes with diffuse interstitial prominence and patchy airspace opacities. Findings could represent pulmonary edema and/or pneumonia. 2. Cardiomegaly. Electronically Signed   By: Feliberto Harts MD   On: 07/16/2020 12:48   Assessment/Plan COVID 19 PNA     - admit to inpatient, med-surg     - solumedrol, remdes, baricitinib, inhalers, anti-tussives, IS, flutter     - follow inflammatory markers     - wean O2 as able  Hypokalemia     - check Mg2+     - replace K+  Morbid obesity     - lifestyle counseling; follow up outpt  Tobacco abuse EtOH abuse Mj abuse      - counseled against further use    DVT prophylaxis: lovenox  Code Status: FULL  Family Communication: None at bedside.  Consults called: None  Status is: Inpatient  Remains inpatient appropriate because:Inpatient level of care appropriate due to severity of illness   Dispo: The patient is from: Home              Anticipated d/c is to: Home              Anticipated d/c date is: 3 days              Patient currently is not medically stable to d/c.  Teddy Spike DO [facility]  If 7PM-7AM, please contact night-coverage [email or web address]  07/16/2020, 4:29 PM

## 2020-07-17 DIAGNOSIS — R0603 Acute respiratory distress: Secondary | ICD-10-CM

## 2020-07-17 DIAGNOSIS — D509 Iron deficiency anemia, unspecified: Secondary | ICD-10-CM | POA: Diagnosis not present

## 2020-07-17 DIAGNOSIS — U071 COVID-19: Secondary | ICD-10-CM | POA: Diagnosis not present

## 2020-07-17 DIAGNOSIS — J9600 Acute respiratory failure, unspecified whether with hypoxia or hypercapnia: Secondary | ICD-10-CM | POA: Diagnosis not present

## 2020-07-17 LAB — COMPREHENSIVE METABOLIC PANEL
ALT: 21 U/L (ref 0–44)
AST: 33 U/L (ref 15–41)
Albumin: 3.3 g/dL — ABNORMAL LOW (ref 3.5–5.0)
Alkaline Phosphatase: 53 U/L (ref 38–126)
Anion gap: 10 (ref 5–15)
BUN: 13 mg/dL (ref 6–20)
CO2: 27 mmol/L (ref 22–32)
Calcium: 8.7 mg/dL — ABNORMAL LOW (ref 8.9–10.3)
Chloride: 100 mmol/L (ref 98–111)
Creatinine, Ser: 0.57 mg/dL (ref 0.44–1.00)
GFR, Estimated: >60 mL/min (ref >60)
Glucose, Bld: 144 mg/dL — ABNORMAL HIGH (ref 70–99)
Potassium: 4 mmol/L (ref 3.5–5.1)
Sodium: 137 mmol/L (ref 135–145)
Total Bilirubin: 0.4 mg/dL (ref 0.3–1.2)
Total Protein: 6.8 g/dL (ref 6.5–8.1)

## 2020-07-17 LAB — MAGNESIUM: Magnesium: 2.2 mg/dL (ref 1.7–2.4)

## 2020-07-17 LAB — D-DIMER, QUANTITATIVE: D-Dimer, Quant: 2.48 ug/mL-FEU — ABNORMAL HIGH (ref 0.00–0.50)

## 2020-07-17 LAB — CBC WITH DIFFERENTIAL/PLATELET
Abs Immature Granulocytes: 0.13 10*3/uL — ABNORMAL HIGH (ref 0.00–0.07)
Basophils Absolute: 0 10*3/uL (ref 0.0–0.1)
Basophils Relative: 0 %
Eosinophils Absolute: 0 10*3/uL (ref 0.0–0.5)
Eosinophils Relative: 0 %
HCT: 32.5 % — ABNORMAL LOW (ref 36.0–46.0)
Hemoglobin: 9.1 g/dL — ABNORMAL LOW (ref 12.0–15.0)
Immature Granulocytes: 4 %
Lymphocytes Relative: 25 %
Lymphs Abs: 0.9 10*3/uL (ref 0.7–4.0)
MCH: 19.1 pg — ABNORMAL LOW (ref 26.0–34.0)
MCHC: 28 g/dL — ABNORMAL LOW (ref 30.0–36.0)
MCV: 68.3 fL — ABNORMAL LOW (ref 80.0–100.0)
Monocytes Absolute: 0.1 10*3/uL (ref 0.1–1.0)
Monocytes Relative: 4 %
Neutro Abs: 2.4 10*3/uL (ref 1.7–7.7)
Neutrophils Relative %: 67 %
Platelets: 400 10*3/uL (ref 150–400)
RBC: 4.76 MIL/uL (ref 3.87–5.11)
RDW: 21.9 % — ABNORMAL HIGH (ref 11.5–15.5)
WBC: 3.5 10*3/uL — ABNORMAL LOW (ref 4.0–10.5)
nRBC: 0 % (ref 0.0–0.2)

## 2020-07-17 LAB — C-REACTIVE PROTEIN: CRP: 3.8 mg/dL — ABNORMAL HIGH (ref <1.0)

## 2020-07-17 LAB — FERRITIN: Ferritin: 38 ng/mL (ref 11–307)

## 2020-07-17 MED ORDER — ENSURE MAX PROTEIN PO LIQD
11.0000 [oz_av] | Freq: Every day | ORAL | Status: DC
  Administered 2020-07-17 – 2020-07-22 (×6): 11 [oz_av] via ORAL

## 2020-07-17 MED ORDER — LIP MEDEX EX OINT
TOPICAL_OINTMENT | CUTANEOUS | Status: DC | PRN
  Filled 2020-07-17 (×2): qty 7

## 2020-07-17 NOTE — Progress Notes (Signed)
Initial Nutrition Assessment  DOCUMENTATION CODES:   Morbid obesity  INTERVENTION:   -Ensure MAX Protein po daily, each supplement provides 150 kcal and 30 grams of protein  NUTRITION DIAGNOSIS:   Increased nutrient needs related to acute illness (COVID-19 infection) as evidenced by estimated needs.  GOAL:   Patient will meet greater than or equal to 90% of their needs  MONITOR:   PO intake, Supplement acceptance, Labs, Weight trends, I & O's  REASON FOR ASSESSMENT:   Malnutrition Screening Tool    ASSESSMENT:   44 y.o. female past medical history significant for asthma presents with dyspnea relates the symptoms started 7 days prior to admission in the ED chest x-ray showed diffuse patchy opacities she was Covid positive and placed on 4 L of oxygen.  COVID-19 positive since 12/6.   Pt reports she was eating and drinking normally PTA. Pt consuming 100% of meals at this time.  Reports weight loss of 10 lbs over 2-3 weeks. Ordered Ensure Max for additional protein given active COVID-19 infection.  Labs reviewed. Medications: ferrous sulfate,  KLOR-CON  NUTRITION - FOCUSED PHYSICAL EXAM:  Unable to complete  Diet Order:   Diet Order            Diet regular Room service appropriate? Yes; Fluid consistency: Thin  Diet effective now                 EDUCATION NEEDS:   No education needs have been identified at this time  Skin:  Skin Assessment: Reviewed RN Assessment  Last BM:  12/6  Height:   Ht Readings from Last 1 Encounters:  07/16/20 5\' 4"  (1.626 m)    Weight:   Wt Readings from Last 1 Encounters:  07/16/20 113.9 kg    BMI:  Body mass index is 43.08 kg/m.  Estimated Nutritional Needs:   Kcal:  1800-2000  Protein:  85-100g  Fluid:  2L/day  14/06/21, MS, RD, LDN Inpatient Clinical Dietitian Contact information available via Amion

## 2020-07-17 NOTE — Progress Notes (Signed)
[facility] PROGRESS NOTE    Progress Note  Sharon Faulkner  VWU:[id number] DOB: [birth year] DOA: 07/16/2020 PCP: Patient, No Pcp Per     Brief Narrative:   Sharon Faulkner is an 44 y.o. female past medical history significant for asthma presents with dyspnea relates the symptoms started 7 days prior to admission in the ED chest x-ray showed diffuse patchy opacities she was Covid positive and placed on 4 L of oxygen.  Assessment/Plan:   Acute respiratory failure due to COVID-19 [facility]) She was started on IV remdesivir, steroids and baricitinib. Her inflammatory markers are unchanged and cont to be  elevated we will continue to follow intermittently. He is still requiring 4 L of oxygen try to keep saturations greater than 92%. Encourage out of bed to chair, when in bed encourage prone position, continue to use incentive spirometry and flutter valve.  Procalcitonin was low yield. Continue Lovenox for DVT prophylaxis.  Microcytic anemia In a menstruating female likely iron deficiency anemia.  Her ferritin will be unreliable with a new diagnosis of Covid.  Will need to be followed up as an outpatient  DVT prophylaxis: lovenox Family Communication:none Status is: Inpatient  Remains inpatient appropriate because:Hemodynamically unstable   Dispo: The patient is from: Home              Anticipated d/c is to: Home              Anticipated d/c date is: > 3 days              Patient currently is medically stable to d/c.     Code Status:     Code Status Orders  (From admission, onward)         Start     Ordered   07/16/20 1650  Full code  Continuous        07/16/20 1652        Code Status History    This patient has a current code status but no historical code status.   Advance Care Planning Activity        IV Access:    Peripheral IV   Procedures and diagnostic studies:   DG Chest Portable 1 View  Result Date: 07/16/2020 CLINICAL DATA:  Shortness of breath.   Hypoxia. EXAM: PORTABLE CHEST 1 VIEW COMPARISON:  Chest radiographs April 23, 2015. FINDINGS: Low lung volumes with diffuse interstitial prominence and patchy airspace opacities. No visible pleural effusions or pneumothorax. Enlarged cardiac silhouette. No acute osseous abnormality. IMPRESSION: 1. Low lung volumes with diffuse interstitial prominence and patchy airspace opacities. Findings could represent pulmonary edema and/or pneumonia. 2. Cardiomegaly. Electronically Signed   By: Feliberto Harts MD   On: 07/16/2020 12:48     Medical Consultants:    None.  Anti-Infectives:   Remdesivir  Subjective:    Sharon Faulkner relates that she still significantly short of breath is unchanged compared to yesterday.  Objective:    Vitals:   07/16/20 1610 07/16/20 1948 07/17/20 0102 07/17/20 0311  BP: 117/73 111/74 114/76 111/71  Pulse: 90 85 80 76  Resp: 20 (!) 24 (!) 24 (!) 21  Temp: 99.8 F (37.7 C) 98.4 F (36.9 C) 99.5 F (37.5 C) 99.1 F (37.3 C)  TempSrc: Oral Oral Oral Oral  SpO2: 96% 95% 94% 93%  Weight:      Height:       SpO2: 93 % O2 Flow Rate (L/min): 5 L/min   Intake/Output Summary (Last 24 hours) at 07/17/2020 0848  Last data filed at 07/16/2020 1900 Gross per 24 hour  Intake 531.94 ml  Output --  Net 531.94 ml   Filed Weights   07/16/20 1608  Weight: 113.9 kg    Exam: General exam: In no acute distress. Respiratory system: Good air movement and clear to auscultation. Cardiovascular system: S1 & S2 heard, RRR. No JVD. Gastrointestinal system: Abdomen is nondistended, soft and nontender.  Extremities: No pedal edema. Skin: No rashes, lesions or ulcers Psychiatry: Judgement and insight appear normal. Mood & affect appropriate.    Data Reviewed:    Labs: Basic Metabolic Panel: Recent Labs  Lab 07/16/20 1138 07/17/20 0422  NA 135 137  K 3.1* 4.0  CL 98 100  CO2 29 27  GLUCOSE 113* 144*  BUN 9 13  CREATININE 0.77 0.57  CALCIUM 8.4* 8.7*   MG  --  2.2   GFR Estimated Creatinine Clearance: 109.9 mL/min (by C-G formula based on SCr of 0.57 mg/dL). Liver Function Tests: Recent Labs  Lab 07/17/20 0422  AST 33  ALT 21  ALKPHOS 53  BILITOT 0.4  PROT 6.8  ALBUMIN 3.3*   No results for input(s): LIPASE, AMYLASE in the last 168 hours. No results for input(s): AMMONIA in the last 168 hours. Coagulation profile No results for input(s): INR, PROTIME in the last 168 hours. COVID-19 Labs  Recent Labs    07/16/20 1138 07/17/20 0422  DDIMER 2.98* 2.48*  FERRITIN 56 38  LDH 423*  --   CRP 3.9* 3.8*    Lab Results  Component Value Date   SARSCOV2NAA POSITIVE (A) 07/16/2020    CBC: Recent Labs  Lab 07/16/20 1138 07/17/20 0422  WBC 5.7 3.5*  NEUTROABS  --  2.4  HGB 9.1* 9.1*  HCT 32.8* 32.5*  MCV 68.3* 68.3*  PLT 348 400   Cardiac Enzymes: No results for input(s): CKTOTAL, CKMB, CKMBINDEX, TROPONINI in the last 168 hours. BNP (last 3 results) No results for input(s): PROBNP in the last 8760 hours. CBG: No results for input(s): GLUCAP in the last 168 hours. D-Dimer: Recent Labs    07/16/20 1138 07/17/20 0422  DDIMER 2.98* 2.48*   Hgb A1c: No results for input(s): HGBA1C in the last 72 hours. Lipid Profile: Recent Labs    07/16/20 1138  TRIG 91   Thyroid function studies: No results for input(s): TSH, T4TOTAL, T3FREE, THYROIDAB in the last 72 hours.  Invalid input(s): FREET3 Anemia work up: Recent Labs    07/16/20 1138 07/17/20 0422  FERRITIN 56 38   Sepsis Labs: Recent Labs  Lab 07/16/20 1138 07/16/20 1513 07/16/20 1624 07/17/20 0422  PROCALCITON <0.10  --   --   --   WBC 5.7  --   --  3.5*  LATICACIDVEN  --  0.9 1.0  --    Microbiology Recent Results (from the past 240 hour(s))  Resp Panel by RT-PCR (Flu A&B, Covid) Nasopharyngeal Swab     Status: Abnormal   Collection Time: 07/16/20 12:50 PM   Specimen: Nasopharyngeal Swab; Nasopharyngeal(NP) swabs in vial transport medium   Result Value Ref Range Status   SARS Coronavirus 2 by RT PCR POSITIVE (A) NEGATIVE Final    Comment: RESULT CALLED TO, READ BACK BY AND VERIFIED WITH: D,JACOB AT 1400 ON 07/16/20 BY A,MOHAMED (NOTE) SARS-CoV-2 target nucleic acids are DETECTED.  The SARS-CoV-2 RNA is generally detectable in upper respiratory specimens during the acute phase of infection. Positive results are indicative of the presence of the identified virus, but do  not rule out bacterial infection or co-infection with other pathogens not detected by the test. Clinical correlation with patient history and other diagnostic information is necessary to determine patient infection status. The expected result is Negative.  Fact Sheet for Patients: BloggerCourse.com  Fact Sheet for Healthcare Providers: SeriousBroker.it  This test is not yet approved or cleared by the [place] FDA and  has been authorized for detection and/or diagnosis of SARS-CoV-2 by FDA under an Emergency Use Authorization (EUA).  This EUA will remain in effect (meaning this test  can be used) for the duration of  the COVID-19 declaration under Section 564(b)(1) of the Act, 21 U.S.C. section 360bbb-3(b)(1), unless the authorization is terminated or revoked sooner.     Influenza A by PCR NEGATIVE NEGATIVE Final   Influenza B by PCR NEGATIVE NEGATIVE Final    Comment: (NOTE) The Xpert Xpress SARS-CoV-2/FLU/RSV plus assay is intended as an aid in the diagnosis of influenza from Nasopharyngeal swab specimens and should not be used as a sole basis for treatment. Nasal washings and aspirates are unacceptable for Xpert Xpress SARS-CoV-2/FLU/RSV testing.  Fact Sheet for Patients: BloggerCourse.com  Fact Sheet for Healthcare Providers: SeriousBroker.it  This test is not yet approved or cleared by the [place] FDA and has been authorized for  detection and/or diagnosis of SARS-CoV-2 by FDA under an Emergency Use Authorization (EUA). This EUA will remain in effect (meaning this test can be used) for the duration of the COVID-19 declaration under Section 564(b)(1) of the Act, 21 U.S.C. section 360bbb-3(b)(1), unless the authorization is terminated or revoked.  Performed at [facility], [address] [place], [place] [address]      Medications:   . baricitinib  4 mg Oral Daily  . enoxaparin (LOVENOX) injection  55 mg Subcutaneous Q24H  . ferrous sulfate  325 mg Oral Daily  . Ipratropium-Albuterol  1 puff Inhalation Q6H  . methylPREDNISolone (SOLU-MEDROL) injection  1 mg/kg Intravenous Q12H   Followed by  . [START ON 07/20/2020] predniSONE  50 mg Oral Daily  . potassium chloride  40 mEq Oral BID  . Ensure Max Protein  11 oz Oral Daily   Continuous Infusions: . remdesivir 100 mg in NS 100 mL        LOS: 1 day   Marinda Elk  [facility]  07/17/2020, 8:48 AM

## 2020-07-18 DIAGNOSIS — J9600 Acute respiratory failure, unspecified whether with hypoxia or hypercapnia: Secondary | ICD-10-CM | POA: Diagnosis not present

## 2020-07-18 DIAGNOSIS — U071 COVID-19: Principal | ICD-10-CM

## 2020-07-18 DIAGNOSIS — D509 Iron deficiency anemia, unspecified: Secondary | ICD-10-CM

## 2020-07-18 LAB — D-DIMER, QUANTITATIVE: D-Dimer, Quant: 3.2 ug/mL-FEU — ABNORMAL HIGH (ref 0.00–0.50)

## 2020-07-18 LAB — CBC WITH DIFFERENTIAL/PLATELET
Abs Immature Granulocytes: 0.11 10*3/uL — ABNORMAL HIGH (ref 0.00–0.07)
Basophils Absolute: 0 10*3/uL (ref 0.0–0.1)
Basophils Relative: 0 %
Eosinophils Absolute: 0 10*3/uL (ref 0.0–0.5)
Eosinophils Relative: 0 %
HCT: 34.9 % — ABNORMAL LOW (ref 36.0–46.0)
Hemoglobin: 9.4 g/dL — ABNORMAL LOW (ref 12.0–15.0)
Immature Granulocytes: 1 %
Lymphocytes Relative: 13 %
Lymphs Abs: 1.1 10*3/uL (ref 0.7–4.0)
MCH: 19 pg — ABNORMAL LOW (ref 26.0–34.0)
MCHC: 26.9 g/dL — ABNORMAL LOW (ref 30.0–36.0)
MCV: 70.6 fL — ABNORMAL LOW (ref 80.0–100.0)
Monocytes Absolute: 0.3 10*3/uL (ref 0.1–1.0)
Monocytes Relative: 4 %
Neutro Abs: 6.7 10*3/uL (ref 1.7–7.7)
Neutrophils Relative %: 82 %
Platelets: 396 10*3/uL (ref 150–400)
RBC: 4.94 MIL/uL (ref 3.87–5.11)
RDW: 22.5 % — ABNORMAL HIGH (ref 11.5–15.5)
WBC: 8.2 10*3/uL (ref 4.0–10.5)
nRBC: 0 % (ref 0.0–0.2)

## 2020-07-18 LAB — COMPREHENSIVE METABOLIC PANEL
ALT: 20 U/L (ref 0–44)
AST: 32 U/L (ref 15–41)
Albumin: 3.2 g/dL — ABNORMAL LOW (ref 3.5–5.0)
Alkaline Phosphatase: 56 U/L (ref 38–126)
Anion gap: 11 (ref 5–15)
BUN: 16 mg/dL (ref 6–20)
CO2: 24 mmol/L (ref 22–32)
Calcium: 8.9 mg/dL (ref 8.9–10.3)
Chloride: 105 mmol/L (ref 98–111)
Creatinine, Ser: 0.69 mg/dL (ref 0.44–1.00)
GFR, Estimated: >60 mL/min (ref >60)
Glucose, Bld: 158 mg/dL — ABNORMAL HIGH (ref 70–99)
Potassium: 4.7 mmol/L (ref 3.5–5.1)
Sodium: 140 mmol/L (ref 135–145)
Total Bilirubin: 0.5 mg/dL (ref 0.3–1.2)
Total Protein: 6.9 g/dL (ref 6.5–8.1)

## 2020-07-18 LAB — MAGNESIUM: Magnesium: 2.1 mg/dL (ref 1.7–2.4)

## 2020-07-18 LAB — FERRITIN: Ferritin: 36 ng/mL (ref 11–307)

## 2020-07-18 LAB — C-REACTIVE PROTEIN: CRP: 1.3 mg/dL — ABNORMAL HIGH (ref <1.0)

## 2020-07-18 MED ORDER — METHYLPREDNISOLONE SODIUM SUCC 125 MG IJ SOLR
60.0000 mg | Freq: Two times a day (BID) | INTRAMUSCULAR | Status: DC
  Administered 2020-07-18 – 2020-07-20 (×4): 60 mg via INTRAVENOUS
  Filled 2020-07-18 (×4): qty 2

## 2020-07-18 MED ORDER — SALINE SPRAY 0.65 % NA SOLN
1.0000 | NASAL | Status: DC | PRN
  Filled 2020-07-18: qty 44

## 2020-07-18 NOTE — Progress Notes (Signed)
PROGRESS NOTE  Sharon Faulkner  ZWC:[id number] DOB: 02-03-1976 DOA: 07/16/2020 PCP: Patient, No Pcp Per   Brief Narrative: Sharon Faulkner is a 44 y.o. female with a history of asthma who presented to the ED 12/6 with 7 days of shortness of breath found to have positive SARS-CoV-2 and diffuse patchy opacities on CXR requiring 4L O2.   Assessment & Plan: Active Problems:   Acute respiratory failure due to COVID-19 (HCC)   Microcytic anemia  Acute hypoxemic respiratory failure due to covid-19 pneumonia: SARS-CoV-2 PCR positive on 12/6.  - Wean oxygen as tolerated, currently 4L O2.  - Continue remdesivir x5 days (12/6 - 12/10) - Continue steroids. CRP improving. PCT negative.  - Continue baricitinib - Encourage OOB, IS, FV, and awake proning if able - Continue airborne, contact precautions for 21 days from positive testing. - Monitor CMP and inflammatory markers - Enoxaparin prophylactic dose. D-dimer elevated.   Microcytic anemia, suspected iron deficiency anemia: In menstruating patient. Ferritin is wnl though this is in setting of acute infection.  - Monitor. Hgb stable in 9's. - Supplement iron.  Hypokalemia:  - Stop standing supplement with level 4.7.   Morbid obesity: Estimated body mass index is 43.08 kg/m as calculated from the following:   Height as of this encounter: 5\' 4"  (1.626 m).   Weight as of this encounter: 113.9 kg.  DVT prophylaxis: Lovenox 0.5 mg/kg q24h Code Status: Full Family Communication: None at bedside.  Disposition Plan:  Status is: Inpatient  Remains inpatient appropriate because:Inpatient level of care appropriate due to severity of illness  Dispo: The patient is from: Home              Anticipated d/c is to: Home              Anticipated d/c date is: 3 days              Patient currently is not medically stable to d/c.  Consultants:   None  Procedures:   None  Antimicrobials:  Remdesivir   Subjective: Shortness of breath improving  overall, still moderate limiting exertion. Proning regularly. No chest pain except with cough. No leg swelling.   Objective: Vitals:   07/17/20 0311 07/17/20 1426 07/17/20 2005 07/18/20 0345  BP: 111/71 102/71 (!) 129/92 116/78  Pulse: 76 68 71 71  Resp: (!) 21 18 (!) 24 (!) 21  Temp: 99.1 F (37.3 C) 98.1 F (36.7 C) 98.3 F (36.8 C) 98.6 F (37 C)  TempSrc: Oral Oral Oral Oral  SpO2: 93% 96% 95% 100%  Weight:      Height:        Intake/Output Summary (Last 24 hours) at 07/18/2020 1358 Last data filed at 07/17/2020 1608 Gross per 24 hour  Intake 0.82 ml  Output --  Net 0.82 ml   Filed Weights   07/16/20 1608  Weight: 113.9 kg    Gen: 44 y.o. female in no distress Pulm: Tachypneic with good aeration throughout, some crackles at mid-lower zones bilaterally. CV: Regular rate and rhythm. No murmur, rub, or gallop. No JVD, no pitting pedal edema. GI: Abdomen soft, non-tender, non-distended, with normoactive bowel sounds. No organomegaly or masses felt. Ext: Warm, no deformities Skin: No rashes, lesions or ulcers on visualized skin Neuro: Alert and oriented. No focal neurological deficits. Psych: Judgement and insight appear normal. Mood & affect appropriate.   Data Reviewed: I have personally reviewed following labs and imaging studies  CBC: Recent Labs  Lab 07/16/20 1138 07/17/20 0422  07/18/20 0420  WBC 5.7 3.5* 8.2  NEUTROABS  --  2.4 6.7  HGB 9.1* 9.1* 9.4*  HCT 32.8* 32.5* 34.9*  MCV 68.3* 68.3* 70.6*  PLT 348 400 396   Basic Metabolic Panel: Recent Labs  Lab 07/16/20 1138 07/17/20 0422 07/18/20 0420  NA 135 137 140  K 3.1* 4.0 4.7  CL 98 100 105  CO2 29 27 24   GLUCOSE 113* 144* 158*  BUN 9 13 16   CREATININE 0.77 0.57 0.69  CALCIUM 8.4* 8.7* 8.9  MG  --  2.2 2.1   GFR: Estimated Creatinine Clearance: 109.9 mL/min (by C-G formula based on SCr of 0.69 mg/dL). Liver Function Tests: Recent Labs  Lab 07/17/20 0422 07/18/20 0420  AST 33 32  ALT  21 20  ALKPHOS 53 56  BILITOT 0.4 0.5  PROT 6.8 6.9  ALBUMIN 3.3* 3.2*   No results for input(s): LIPASE, AMYLASE in the last 168 hours. No results for input(s): AMMONIA in the last 168 hours. Coagulation Profile: No results for input(s): INR, PROTIME in the last 168 hours. Cardiac Enzymes: No results for input(s): CKTOTAL, CKMB, CKMBINDEX, TROPONINI in the last 168 hours. BNP (last 3 results) No results for input(s): PROBNP in the last 8760 hours. HbA1C: No results for input(s): HGBA1C in the last 72 hours. CBG: No results for input(s): GLUCAP in the last 168 hours. Lipid Profile: Recent Labs    07/16/20 1138  TRIG 91   Thyroid Function Tests: No results for input(s): TSH, T4TOTAL, FREET4, T3FREE, THYROIDAB in the last 72 hours. Anemia Panel: Recent Labs    07/17/20 0422 07/18/20 0420  FERRITIN 38 36   Urine analysis:    Component Value Date/Time   COLORURINE YELLOW 06/20/2020 2029   APPEARANCEUR CLEAR 06/20/2020 2029   LABSPEC 1.023 06/20/2020 2029   PHURINE 5.0 06/20/2020 2029   GLUCOSEU NEGATIVE 06/20/2020 2029   HGBUR NEGATIVE 06/20/2020 2029   BILIRUBINUR NEGATIVE 06/20/2020 2029   KETONESUR NEGATIVE 06/20/2020 2029   PROTEINUR NEGATIVE 06/20/2020 2029   NITRITE NEGATIVE 06/20/2020 2029   LEUKOCYTESUR NEGATIVE 06/20/2020 2029   Recent Results (from the past 240 hour(s))  Resp Panel by RT-PCR (Flu A&B, Covid) Nasopharyngeal Swab     Status: Abnormal   Collection Time: 07/16/20 12:50 PM   Specimen: Nasopharyngeal Swab; Nasopharyngeal(NP) swabs in vial transport medium  Result Value Ref Range Status   SARS Coronavirus 2 by RT PCR POSITIVE (A) NEGATIVE Final    Comment: RESULT CALLED TO, READ BACK BY AND VERIFIED WITH: D,JACOB AT 1400 ON 07/16/20 BY A,MOHAMED (NOTE) SARS-CoV-2 target nucleic acids are DETECTED.  The SARS-CoV-2 RNA is generally detectable in upper respiratory specimens during the acute phase of infection. Positive results are indicative of  the presence of the identified virus, but do not rule out bacterial infection or co-infection with other pathogens not detected by the test. Clinical correlation with patient history and other diagnostic information is necessary to determine patient infection status. The expected result is Negative.  Fact Sheet for Patients: 14/06/21  Fact Sheet for Healthcare Providers: 14/6/21  This test is not yet approved or cleared by the [employer] FDA and  has been authorized for detection and/or diagnosis of SARS-CoV-2 by FDA under an Emergency Use Authorization (EUA).  This EUA will remain in effect (meaning this test  can be used) for the duration of  the COVID-19 declaration under Section 564(b)(1) of the Act, 21 U.S.C. section 360bbb-3(b)(1), unless the authorization is terminated or revoked sooner.  Influenza A by PCR NEGATIVE NEGATIVE Final   Influenza B by PCR NEGATIVE NEGATIVE Final    Comment: (NOTE) The Xpert Xpress SARS-CoV-2/FLU/RSV plus assay is intended as an aid in the diagnosis of influenza from Nasopharyngeal swab specimens and should not be used as a sole basis for treatment. Nasal washings and aspirates are unacceptable for Xpert Xpress SARS-CoV-2/FLU/RSV testing.  Fact Sheet for Patients: BloggerCourse.com  Fact Sheet for Healthcare Providers: SeriousBroker.it  This test is not yet approved or cleared by the [place] FDA and has been authorized for detection and/or diagnosis of SARS-CoV-2 by FDA under an Emergency Use Authorization (EUA). This EUA will remain in effect (meaning this test can be used) for the duration of the COVID-19 declaration under Section 564(b)(1) of the Act, 21 U.S.C. section 360bbb-3(b)(1), unless the authorization is terminated or revoked.  Performed at [facility], [address] [place], [place] [address]       Radiology Studies: No results found.  Scheduled Meds: . baricitinib  4 mg Oral Daily  . enoxaparin (LOVENOX) injection  55 mg Subcutaneous Q24H  . ferrous sulfate  325 mg Oral Daily  . Ipratropium-Albuterol  1 puff Inhalation Q6H  . methylPREDNISolone (SOLU-MEDROL) injection  1 mg/kg Intravenous Q12H   Followed by  . [START ON 07/20/2020] predniSONE  50 mg Oral Daily  . potassium chloride  40 mEq Oral BID  . Ensure Max Protein  11 oz Oral Daily   Continuous Infusions: . remdesivir 100 mg in NS 100 mL 100 mg (07/18/20 1011)     LOS: 2 days   Time spent: 25 minutes.  Tyrone Nine, MD [facility] [email or web address] 07/18/2020, 1:58 PM

## 2020-07-19 DIAGNOSIS — U071 COVID-19: Secondary | ICD-10-CM | POA: Diagnosis not present

## 2020-07-19 DIAGNOSIS — D509 Iron deficiency anemia, unspecified: Secondary | ICD-10-CM | POA: Diagnosis not present

## 2020-07-19 DIAGNOSIS — J9600 Acute respiratory failure, unspecified whether with hypoxia or hypercapnia: Secondary | ICD-10-CM | POA: Diagnosis not present

## 2020-07-19 MED ORDER — OXYMETAZOLINE HCL 0.05 % NA SOLN
1.0000 | Freq: Two times a day (BID) | NASAL | Status: AC
  Administered 2020-07-19 – 2020-07-21 (×6): 1 via NASAL
  Filled 2020-07-19: qty 15

## 2020-07-19 MED ORDER — OXYMETAZOLINE HCL 0.05 % NA SOLN: 1.0000 | Freq: Two times a day (BID) | NASAL | Status: DC

## 2020-07-19 NOTE — Plan of Care (Signed)
  Problem: Health Behavior/Discharge Planning: Goal: Ability to manage health-related needs will improve Outcome: Progressing   Problem: Clinical Measurements: Goal: Will remain free from infection Outcome: Progressing Goal: Respiratory complications will improve Outcome: Progressing   Problem: Activity: Goal: Risk for activity intolerance will decrease Outcome: Progressing   

## 2020-07-19 NOTE — Progress Notes (Signed)
PROGRESS NOTE  Danyeal Akens  TKW:[id number] DOB: 09/15/1975 DOA: 07/16/2020 PCP: Patient, No Pcp Per   Brief Narrative: Christyn Gutkowski is a 44 y.o. female with a history of asthma who presented to the ED 12/6 with 7 days of shortness of breath found to have positive SARS-CoV-2 and diffuse patchy opacities on CXR requiring 4L O2.   Assessment & Plan: Active Problems:   Acute respiratory failure due to COVID-19 (HCC)   Microcytic anemia  Acute hypoxemic respiratory failure due to covid-19 pneumonia: SARS-CoV-2 PCR positive on 12/6.  - Wean oxygen as tolerated, I cut back to 2L O2 though saturations remain marginal at rest. - Nasal saline prn, afrin BID up to 3 days - Continue remdesivir x5 days (12/6 - 12/10) - Continue steroids. CRP improving. PCT negative. Recheck in AM - Continue baricitinib - Encourage OOB, IS, FV, and awake proning if able - Continue airborne, contact precautions for 21 days from positive testing. - Monitor CMP and inflammatory markers - D-dimer elevated, though pt declining lovenox.   Microcytic anemia, suspected iron deficiency anemia: In menstruating patient. Ferritin is wnl though this is in setting of acute infection.  - Monitor. Hgb stable in 9's. - Supplement iron.  Hypokalemia:  - Stop standing supplement with level 4.7.   Morbid obesity: Estimated body mass index is 43.08 kg/m as calculated from the following:   Height as of this encounter: 5\' 4"  (1.626 m).   Weight as of this encounter: 113.9 kg.  DVT prophylaxis: Lovenox 0.5 mg/kg q24h Code Status: Full Family Communication: None at bedside.  Disposition Plan:  Status is: Inpatient  Remains inpatient appropriate because:Inpatient level of care appropriate due to severity of illness  Dispo: The patient is from: Home              Anticipated d/c is to: Home              Anticipated d/c date is: 1 day              Patient currently is not medically stable to d/c.  Consultants:    None  Procedures:   None  Antimicrobials:  Remdesivir 12/6 - 12/10  Subjective: Tired, laying on belly most of the time. Took of nasal cannula because of nasal irritation/pressure in sinuses. No bleeding or chest pain reported. Shortness of breath is moderate with exertion, improved with rest.   Objective: Vitals:   07/18/20 2100 07/18/20 2300 07/19/20 0100 07/19/20 0409  BP:    (!) 151/77  Pulse:    (!) 54  Resp:    20  Temp:    98.5 F (36.9 C)  TempSrc:    Oral  SpO2: 90% 92% 93% 93%  Weight:      Height:        Intake/Output Summary (Last 24 hours) at 07/19/2020 1128 Last data filed at 07/19/2020 1022 Gross per 24 hour  Intake 594 ml  Output --  Net 594 ml   Filed Weights   07/16/20 1608  Weight: 113.9 kg    Gen: 44 y.o. female in no distress Pulm: Nonlabored but remains tachypneic, SpO2 89-91% on room air at rest. Improved aeration, no crackles. CV: Regular rate and rhythm. No murmur, rub, or gallop. No JVD, no dependent edema. GI: Abdomen soft, non-tender, non-distended, with normoactive bowel sounds.  Ext: Warm, no deformities Skin: No rashes, lesions or ulcers on visualized skin. Neuro: Alert and oriented. No focal neurological deficits. Psych: Judgement and insight appear fair. Mood euthymic &  affect congruent. Behavior is appropriate.    Data Reviewed: I have personally reviewed following labs and imaging studies  CBC: Recent Labs  Lab 07/16/20 1138 07/17/20 0422 07/18/20 0420  WBC 5.7 3.5* 8.2  NEUTROABS  --  2.4 6.7  HGB 9.1* 9.1* 9.4*  HCT 32.8* 32.5* 34.9*  MCV 68.3* 68.3* 70.6*  PLT 348 400 396   Basic Metabolic Panel: Recent Labs  Lab 07/16/20 1138 07/17/20 0422 07/18/20 0420  NA 135 137 140  K 3.1* 4.0 4.7  CL 98 100 105  CO2 29 27 24   GLUCOSE 113* 144* 158*  BUN 9 13 16   CREATININE 0.77 0.57 0.69  CALCIUM 8.4* 8.7* 8.9  MG  --  2.2 2.1   GFR: Estimated Creatinine Clearance: 109.9 mL/min (by C-G formula based on SCr of  0.69 mg/dL). Liver Function Tests: Recent Labs  Lab 07/17/20 0422 07/18/20 0420  AST 33 32  ALT 21 20  ALKPHOS 53 56  BILITOT 0.4 0.5  PROT 6.8 6.9  ALBUMIN 3.3* 3.2*   No results for input(s): LIPASE, AMYLASE in the last 168 hours. No results for input(s): AMMONIA in the last 168 hours. Coagulation Profile: No results for input(s): INR, PROTIME in the last 168 hours. Cardiac Enzymes: No results for input(s): CKTOTAL, CKMB, CKMBINDEX, TROPONINI in the last 168 hours. BNP (last 3 results) No results for input(s): PROBNP in the last 8760 hours. HbA1C: No results for input(s): HGBA1C in the last 72 hours. CBG: No results for input(s): GLUCAP in the last 168 hours. Lipid Profile: Recent Labs    07/16/20 1138  TRIG 91   Thyroid Function Tests: No results for input(s): TSH, T4TOTAL, FREET4, T3FREE, THYROIDAB in the last 72 hours. Anemia Panel: Recent Labs    07/17/20 0422 07/18/20 0420  FERRITIN 38 36   Urine analysis:    Component Value Date/Time   COLORURINE YELLOW 06/20/2020 2029   APPEARANCEUR CLEAR 06/20/2020 2029   LABSPEC 1.023 06/20/2020 2029   PHURINE 5.0 06/20/2020 2029   GLUCOSEU NEGATIVE 06/20/2020 2029   HGBUR NEGATIVE 06/20/2020 2029   BILIRUBINUR NEGATIVE 06/20/2020 2029   KETONESUR NEGATIVE 06/20/2020 2029   PROTEINUR NEGATIVE 06/20/2020 2029   NITRITE NEGATIVE 06/20/2020 2029   LEUKOCYTESUR NEGATIVE 06/20/2020 2029   Recent Results (from the past 240 hour(s))  Resp Panel by RT-PCR (Flu A&B, Covid) Nasopharyngeal Swab     Status: Abnormal   Collection Time: 07/16/20 12:50 PM   Specimen: Nasopharyngeal Swab; Nasopharyngeal(NP) swabs in vial transport medium  Result Value Ref Range Status   SARS Coronavirus 2 by RT PCR POSITIVE (A) NEGATIVE Final    Comment: RESULT CALLED TO, READ BACK BY AND VERIFIED WITH: D,JACOB AT 1400 ON 07/16/20 BY A,MOHAMED (NOTE) SARS-CoV-2 target nucleic acids are DETECTED.  The SARS-CoV-2 RNA is generally detectable  in upper respiratory specimens during the acute phase of infection. Positive results are indicative of the presence of the identified virus, but do not rule out bacterial infection or co-infection with other pathogens not detected by the test. Clinical correlation with patient history and other diagnostic information is necessary to determine patient infection status. The expected result is Negative.  Fact Sheet for Patients: 14/06/21  Fact Sheet for Healthcare Providers: 14/6/21  This test is not yet approved or cleared by the [employer] FDA and  has been authorized for detection and/or diagnosis of SARS-CoV-2 by FDA under an Emergency Use Authorization (EUA).  This EUA will remain in effect (meaning this test  can  be used) for the duration of  the COVID-19 declaration under Section 564(b)(1) of the Act, 21 U.S.C. section 360bbb-3(b)(1), unless the authorization is terminated or revoked sooner.     Influenza A by PCR NEGATIVE NEGATIVE Final   Influenza B by PCR NEGATIVE NEGATIVE Final    Comment: (NOTE) The Xpert Xpress SARS-CoV-2/FLU/RSV plus assay is intended as an aid in the diagnosis of influenza from Nasopharyngeal swab specimens and should not be used as a sole basis for treatment. Nasal washings and aspirates are unacceptable for Xpert Xpress SARS-CoV-2/FLU/RSV testing.  Fact Sheet for Patients: BloggerCourse.com  Fact Sheet for Healthcare Providers: SeriousBroker.it  This test is not yet approved or cleared by the [place] FDA and has been authorized for detection and/or diagnosis of SARS-CoV-2 by FDA under an Emergency Use Authorization (EUA). This EUA will remain in effect (meaning this test can be used) for the duration of the COVID-19 declaration under Section 564(b)(1) of the Act, 21 U.S.C. section 360bbb-3(b)(1), unless the authorization  is terminated or revoked.  Performed at [facility], [address], [place], [place] [address]       Radiology Studies: No results found.  Scheduled Meds: . baricitinib  4 mg Oral Daily  . enoxaparin (LOVENOX) injection  55 mg Subcutaneous Q24H  . ferrous sulfate  325 mg Oral Daily  . Ipratropium-Albuterol  1 puff Inhalation Q6H  . methylPREDNISolone (SOLU-MEDROL) injection  60 mg Intravenous Q12H  . Ensure Max Protein  11 oz Oral Daily   Continuous Infusions: . remdesivir 100 mg in NS 100 mL 100 mg (07/19/20 1004)     LOS: 3 days   Time spent: 25 minutes.  Tyrone Nine, MD [facility] [email or web address] 07/19/2020, 11:28 AM

## 2020-07-20 ENCOUNTER — Inpatient Hospital Stay (HOSPITAL_BASED_OUTPATIENT_CLINIC_OR_DEPARTMENT_OTHER): Payer: HRSA Program

## 2020-07-20 ENCOUNTER — Inpatient Hospital Stay (HOSPITAL_COMMUNITY): Payer: HRSA Program

## 2020-07-20 DIAGNOSIS — U071 COVID-19: Secondary | ICD-10-CM | POA: Diagnosis not present

## 2020-07-20 DIAGNOSIS — J9600 Acute respiratory failure, unspecified whether with hypoxia or hypercapnia: Secondary | ICD-10-CM | POA: Diagnosis not present

## 2020-07-20 DIAGNOSIS — R7989 Other specified abnormal findings of blood chemistry: Secondary | ICD-10-CM

## 2020-07-20 DIAGNOSIS — D509 Iron deficiency anemia, unspecified: Secondary | ICD-10-CM | POA: Diagnosis not present

## 2020-07-20 LAB — C-REACTIVE PROTEIN: CRP: 0.7 mg/dL (ref <1.0)

## 2020-07-20 LAB — COMPREHENSIVE METABOLIC PANEL
ALT: 43 U/L (ref 0–44)
AST: 36 U/L (ref 15–41)
Albumin: 3.1 g/dL — ABNORMAL LOW (ref 3.5–5.0)
Alkaline Phosphatase: 61 U/L (ref 38–126)
Anion gap: 11 (ref 5–15)
BUN: 18 mg/dL (ref 6–20)
CO2: 22 mmol/L (ref 22–32)
Calcium: 9 mg/dL (ref 8.9–10.3)
Chloride: 105 mmol/L (ref 98–111)
Creatinine, Ser: 0.77 mg/dL (ref 0.44–1.00)
GFR, Estimated: >60 mL/min (ref >60)
Glucose, Bld: 141 mg/dL — ABNORMAL HIGH (ref 70–99)
Potassium: 4.6 mmol/L (ref 3.5–5.1)
Sodium: 138 mmol/L (ref 135–145)
Total Bilirubin: 0.4 mg/dL (ref 0.3–1.2)
Total Protein: 6.6 g/dL (ref 6.5–8.1)

## 2020-07-20 LAB — D-DIMER, QUANTITATIVE: D-Dimer, Quant: 10.43 ug/mL-FEU — ABNORMAL HIGH (ref 0.00–0.50)

## 2020-07-20 MED ORDER — SODIUM CHLORIDE (PF) 0.9 % IJ SOLN
INTRAMUSCULAR | Status: AC
  Filled 2020-07-20: qty 50

## 2020-07-20 MED ORDER — RIVAROXABAN 15 MG PO TABS
15.0000 mg | ORAL_TABLET | Freq: Two times a day (BID) | ORAL | Status: DC
  Administered 2020-07-20 (×2): 15 mg via ORAL
  Filled 2020-07-20 (×2): qty 1

## 2020-07-20 MED ORDER — LORAZEPAM 2 MG/ML IJ SOLN
1.0000 mg | Freq: Once | INTRAMUSCULAR | Status: AC | PRN
  Administered 2020-07-20: 1 mg via INTRAVENOUS
  Filled 2020-07-20: qty 1

## 2020-07-20 MED ORDER — RIVAROXABAN 20 MG PO TABS: 20.0000 mg | ORAL_TABLET | Freq: Every day | ORAL | Status: DC

## 2020-07-20 MED ORDER — IOHEXOL 350 MG/ML SOLN
100.0000 mL | Freq: Once | INTRAVENOUS | Status: AC | PRN
  Administered 2020-07-20: 100 mL via INTRAVENOUS

## 2020-07-20 MED ORDER — DEXAMETHASONE 4 MG PO TABS
6.0000 mg | ORAL_TABLET | Freq: Every day | ORAL | Status: DC
  Administered 2020-07-21 – 2020-07-22 (×2): 6 mg via ORAL
  Filled 2020-07-20 (×2): qty 2

## 2020-07-20 MED ORDER — AMLODIPINE BESYLATE 5 MG PO TABS
5.0000 mg | ORAL_TABLET | Freq: Every day | ORAL | Status: DC
  Administered 2020-07-20 – 2020-07-22 (×3): 5 mg via ORAL
  Filled 2020-07-20 (×3): qty 1

## 2020-07-20 NOTE — Plan of Care (Signed)
  Problem: Clinical Measurements: Goal: Will remain free from infection Outcome: Progressing Goal: Diagnostic test results will improve Outcome: Progressing Goal: Respiratory complications will improve Outcome: Progressing   Problem: Nutrition: Goal: Adequate nutrition will be maintained Outcome: Progressing   

## 2020-07-20 NOTE — Progress Notes (Signed)
ANTICOAGULATION CONSULT NOTE   Pharmacy Consult for Heparin Indication: pulmonary embolus  Allergies  Allergen Reactions  . Shellfish Allergy Anaphylaxis and Itching  . Codeine Itching  . Mango Flavor Itching   Patient Measurements: Height: 5\' 4"  (162.6 cm) Weight: 113.9 kg (251 lb) IBW/kg (Calculated) : 54.7 Heparin Dosing Weight: 82kg  Vital Signs: Temp: 98.1 F (36.7 C) (12/10 2030) Temp Source: Oral (12/10 2030) BP: 147/67 (12/10 2030) Pulse Rate: 55 (12/10 2030)  Labs: Recent Labs    07/18/20 0420 07/20/20 0423  HGB 9.4*  --   HCT 34.9*  --   PLT 396  --   CREATININE 0.69 0.77   Estimated Creatinine Clearance: 109.9 mL/min (by C-G formula based on SCr of 0.77 mg/dL).  Medical History: Past Medical History:  Diagnosis Date  . Asthma     Medications:  Scheduled:  . amLODipine  5 mg Oral Daily  . baricitinib  4 mg Oral Daily  . dexamethasone  6 mg Oral Daily  . ferrous sulfate  325 mg Oral Daily  . Ipratropium-Albuterol  1 puff Inhalation Q6H  . oxymetazoline  1 spray Each Nare BID  . Ensure Max Protein  11 oz Oral Daily  . Rivaroxaban  15 mg Oral BID WC   Followed by  . [START ON 08/10/2020] rivaroxaban  20 mg Oral Q supper  . sodium chloride (PF)       Infusions:    Assessment: With CTangio pending, MD ordered empiric treatment dose Xarelto (pt was refusing injections - Lovenox order d/c), patient received two doses Xarelto 15mg . Treatment changed to IV Heparin - will start 12/11 at 0800 (when next Xarelto was due) Note that Heparin level will be elevated due to Xarelto, will titrate Heparin based on aPTT until Xarelto effect dissipates.  Goal of Therapy:  Heparin level 0.3-0.7 units/ml  APTT 66-102 sec Monitor platelets by anticoagulation protocol: Yes   Plan:  Discontinued Xarelto Begin Heparin infusion at 1200 units/hr at 0800 (12/11), no bolus Obtain Baseline Heparin level - will be elevated with Xarelto  Order 1st Hep level and aPTT  tomorrow after start of Heparin infusion  14/11 PharmD 07/20/2020,11:48 PM

## 2020-07-20 NOTE — Progress Notes (Addendum)
Pt. Has been refusing Lovenox injection, MD was notified. Xarelto started p.o. Continued to educate patient re importance of adherence to treatment plan. Will continue to monitor.

## 2020-07-20 NOTE — Progress Notes (Addendum)
PROGRESS NOTE  Sharon Faulkner  ZOX:[id number] DOB: 06/22/1976 DOA: 07/16/2020 PCP: Patient, No Pcp Per   Brief Narrative: Sharon Faulkner is a 44 y.o. female with a history of asthma who presented to the ED 12/6 with 7 days of shortness of breath found to have positive SARS-CoV-2 and diffuse patchy opacities on CXR requiring 4L O2.   Assessment & Plan: Active Problems:   Acute respiratory failure due to COVID-19 (HCC)   Microcytic anemia  Acute hypoxemic respiratory failure due to covid-19 pneumonia: SARS-CoV-2 PCR positive on 12/6.  - Wean oxygen as tolerated, making improvement.  - Though no chest pain and hypoxia is not worsened, the patient has multiple risk factors for DVT/PE including obesity, hospitalization, covid and has been refusing prophylactic anticoagulation. D-dimer now >10. Lower extremity venous U/S did not reveal DVT. Will check CTA chest. Give empiric po anticoagulation (prefers to avoid injections) - Nasal saline prn, afrin BID up to 3 days - Continue remdesivir x5 days (12/6 - 12/10) - Continue steroids. CRP normalized, PCT was negative. Will taper steroids given her anxiety.  - Continue baricitinib - Encourage OOB, IS, FV, and awake proning if able - Continue airborne, contact precautions for 21 days from positive testing. - Monitor CMP and inflammatory markers  RBBB: Noted on prior ECG. Stable.   Microcytic anemia, suspected iron deficiency anemia: In menstruating patient. Ferritin is wnl though this is in setting of acute infection.  - Monitor. Hgb stable in 9's. - Supplement iron.  Hypokalemia:  - Resolved.  HTN:  - Start norvasc.  Anxiety, claustrophobia:  - prn ativan prior to CT scan.   Morbid obesity: Estimated body mass index is 43.08 kg/m as calculated from the following:   Height as of this encounter: 5\' 4"  (1.626 m).   Weight as of this encounter: 113.9 kg.  DVT prophylaxis: Xarelto Code Status: Full Family Communication: None at bedside.   Disposition Plan:  Status is: Inpatient  Remains inpatient appropriate because:Inpatient level of care appropriate due to severity of illness  Dispo: The patient is from: Home              Anticipated d/c is to: Home              Anticipated d/c date is: 1 day              Patient currently is not medically stable to d/c.  Consultants:   None  Procedures:   None  Antimicrobials:  Remdesivir 12/6 - 12/10  Subjective: Wants to take a shower. Eating better. Says shortness of breath is no better today, generally limiting mobility including getting OOB. No chest pain, legs are sore but not acutely so, and not asymmetric. No swelling. No hx VTE.  Objective: Vitals:   07/19/20 2051 07/20/20 0439 07/20/20 0627 07/20/20 0731  BP: (!) 145/82 (!) 173/109 (!) 187/97 (!) 160/108  Pulse: 60 (!) 58 (!) 54 (!) 53  Resp: 18 18    Temp: 98.6 F (37 C) 98.1 F (36.7 C)    TempSrc: Oral Oral    SpO2: 99% 93%    Weight:      Height:        Intake/Output Summary (Last 24 hours) at 07/20/2020 1056 Last data filed at 07/19/2020 2045 Gross per 24 hour  Intake 594 ml  Output --  Net 594 ml   Filed Weights   07/16/20 1608  Weight: 113.9 kg   Gen: 44 y.o. female in no distress Pulm: Mildly labored  when sitting up on EOB without oxygen on, SpO2 88-92%. Clear. CV: Regular borderline bradycardia. No murmur, rub, or gallop. No JVD, no pitting dependent edema. GI: Abdomen soft, non-tender, non-distended, with normoactive bowel sounds.  Ext: Warm, no deformities Skin: No rashes, lesions or ulcers on visualized skin. Neuro: Alert and oriented. No focal neurological deficits. Psych: Judgement and insight appear fair. Mood euthymic & affect congruent. Behavior is appropriate.    Data Reviewed: I have personally reviewed following labs and imaging studies  CBC: Recent Labs  Lab 07/16/20 1138 07/17/20 0422 07/18/20 0420  WBC 5.7 3.5* 8.2  NEUTROABS  --  2.4 6.7  HGB 9.1* 9.1* 9.4*   HCT 32.8* 32.5* 34.9*  MCV 68.3* 68.3* 70.6*  PLT 348 400 396   Basic Metabolic Panel: Recent Labs  Lab 07/16/20 1138 07/17/20 0422 07/18/20 0420 07/20/20 0423  NA 135 137 140 138  K 3.1* 4.0 4.7 4.6  CL 98 100 105 105  CO2 GLUCOSE 113* 144* 158* 141*  BUN CREATININE 0.77 0.57 0.69 0.77  CALCIUM 8.4* 8.7* 8.9 9.0  MG  --  2.2 2.1  --    GFR: Estimated Creatinine Clearance: 109.9 mL/min (by C-G formula based on SCr of 0.77 mg/dL). Liver Function Tests: Recent Labs  Lab 07/17/20 0422 07/18/20 0420 07/20/20 0423  AST 33 32 36  ALT 21 20 43  ALKPHOS 53 56 61  BILITOT 0.4 0.5 0.4  PROT 6.8 6.9 6.6  ALBUMIN 3.3* 3.2* 3.1*   No results for input(s): LIPASE, AMYLASE in the last 168 hours. No results for input(s): AMMONIA in the last 168 hours. Coagulation Profile: No results for input(s): INR, PROTIME in the last 168 hours. Cardiac Enzymes: No results for input(s): CKTOTAL, CKMB, CKMBINDEX, TROPONINI in the last 168 hours. BNP (last 3 results) No results for input(s): PROBNP in the last 8760 hours. HbA1C: No results for input(s): HGBA1C in the last 72 hours. CBG: No results for input(s): GLUCAP in the last 168 hours. Lipid Profile: No results for input(s): CHOL, HDL, LDLCALC, TRIG, CHOLHDL, LDLDIRECT in the last 72 hours. Thyroid Function Tests: No results for input(s): TSH, T4TOTAL, FREET4, T3FREE, THYROIDAB in the last 72 hours. Anemia Panel: Recent Labs    07/18/20 0420  FERRITIN 36   Urine analysis:    Component Value Date/Time   COLORURINE YELLOW 06/20/2020 2029   APPEARANCEUR CLEAR 06/20/2020 2029   LABSPEC 1.023 06/20/2020 2029   PHURINE 5.0 06/20/2020 2029   GLUCOSEU NEGATIVE 06/20/2020 2029   HGBUR NEGATIVE 06/20/2020 2029   BILIRUBINUR NEGATIVE 06/20/2020 2029   KETONESUR NEGATIVE 06/20/2020 2029   PROTEINUR NEGATIVE 06/20/2020 2029   NITRITE NEGATIVE 06/20/2020 2029   LEUKOCYTESUR NEGATIVE 06/20/2020 2029   Recent  Results (from the past 240 hour(s))  Resp Panel by RT-PCR (Flu A&B, Covid) Nasopharyngeal Swab     Status: Abnormal   Collection Time: 07/16/20 12:50 PM   Specimen: Nasopharyngeal Swab; Nasopharyngeal(NP) swabs in vial transport medium  Result Value Ref Range Status   SARS Coronavirus 2 by RT PCR POSITIVE (A) NEGATIVE Final    Comment: RESULT CALLED TO, READ BACK BY AND VERIFIED WITH: D,JACOB AT 1400 ON 07/16/20 BY A,MOHAMED (NOTE) SARS-CoV-2 target nucleic acids are DETECTED.  The SARS-CoV-2 RNA is generally detectable in upper respiratory specimens during the acute phase of infection. Positive results are indicative of the presence of the identified virus, but do not rule out bacterial infection or co-infection  with other pathogens not detected by the test. Clinical correlation with patient history and other diagnostic information is necessary to determine patient infection status. The expected result is Negative.  Fact Sheet for Patients: BloggerCourse.com  Fact Sheet for Healthcare Providers: SeriousBroker.it  This test is not yet approved or cleared by the [place] FDA and  has been authorized for detection and/or diagnosis of SARS-CoV-2 by FDA under an Emergency Use Authorization (EUA).  This EUA will remain in effect (meaning this test  can be used) for the duration of  the COVID-19 declaration under Section 564(b)(1) of the Act, 21 U.S.C. section 360bbb-3(b)(1), unless the authorization is terminated or revoked sooner.     Influenza A by PCR NEGATIVE NEGATIVE Final   Influenza B by PCR NEGATIVE NEGATIVE Final    Comment: (NOTE) The Xpert Xpress SARS-CoV-2/FLU/RSV plus assay is intended as an aid in the diagnosis of influenza from Nasopharyngeal swab specimens and should not be used as a sole basis for treatment. Nasal washings and aspirates are unacceptable for Xpert Xpress SARS-CoV-2/FLU/RSV testing.  Fact Sheet  for Patients: BloggerCourse.com  Fact Sheet for Healthcare Providers: SeriousBroker.it  This test is not yet approved or cleared by the [place] FDA and has been authorized for detection and/or diagnosis of SARS-CoV-2 by FDA under an Emergency Use Authorization (EUA). This EUA will remain in effect (meaning this test can be used) for the duration of the COVID-19 declaration under Section 564(b)(1) of the Act, 21 U.S.C. section 360bbb-3(b)(1), unless the authorization is terminated or revoked.  Performed at [facility], [address], [place], [place] [address]       Radiology Studies: VAS [place] LOWER EXTREMITY VENOUS (DVT)  Result Date: 07/20/2020  Lower Venous DVT Study Indications: Elevated d-dimer, Covid-19, tenderness in legs.  Anticoagulation: Patient refusing. Limitations: Patient unable to tolerate compressions at some segments. Involuntary patient movement. Comparison Study: No prior studies. Performing Technologist: Jean Rosenthal RDMS  Examination Guidelines: A complete evaluation includes B-mode imaging, spectral Doppler, color Doppler, and power Doppler as needed of all accessible portions of each vessel. Bilateral testing is considered an integral part of a complete examination. Limited examinations for reoccurring indications may be performed as noted. The reflux portion of the exam is performed with the patient in reverse Trendelenburg.  +---------+---------------+---------+-----------+----------+---------------+ RIGHT    CompressibilityPhasicitySpontaneityPropertiesThrombus Aging  +---------+---------------+---------+-----------+----------+---------------+ CFV      Full           Yes      Yes                                  +---------+---------------+---------+-----------+----------+---------------+ SFJ      Full                                                          +---------+---------------+---------+-----------+----------+---------------+ FV Prox  Full                                                         +---------+---------------+---------+-----------+----------+---------------+ FV Mid   Full  Yes      Yes                                  +---------+---------------+---------+-----------+----------+---------------+ FV Distal               Yes      Yes                                  +---------+---------------+---------+-----------+----------+---------------+ PFV      Full                                                         +---------+---------------+---------+-----------+----------+---------------+ POP      Full           Yes      Yes                                  +---------+---------------+---------+-----------+----------+---------------+ PTV      Full                                                         +---------+---------------+---------+-----------+----------+---------------+ PERO                                                  Patent by color +---------+---------------+---------+-----------+----------+---------------+ Gastroc  Full                                                         +---------+---------------+---------+-----------+----------+---------------+   +---------+---------------+---------+-----------+----------+--------------+ LEFT     CompressibilityPhasicitySpontaneityPropertiesThrombus Aging +---------+---------------+---------+-----------+----------+--------------+ CFV      Full           Yes      Yes                                 +---------+---------------+---------+-----------+----------+--------------+ SFJ      Full                                                        +---------+---------------+---------+-----------+----------+--------------+ FV Prox  Full                                                         +---------+---------------+---------+-----------+----------+--------------+ FV Mid   Full  Yes      Yes                                 +---------+---------------+---------+-----------+----------+--------------+ FV Distal               Yes      Yes                                 +---------+---------------+---------+-----------+----------+--------------+ PFV      Full                                                        +---------+---------------+---------+-----------+----------+--------------+ POP      Full           Yes      Yes                                 +---------+---------------+---------+-----------+----------+--------------+ PTV      Full                                                        +---------+---------------+---------+-----------+----------+--------------+ PERO     Full                                                        +---------+---------------+---------+-----------+----------+--------------+ Gastroc  Full                                                        +---------+---------------+---------+-----------+----------+--------------+     Summary: RIGHT: - There is no evidence of deep vein thrombosis in the lower extremity. However, portions of this examination were limited- see technologist comments above.  - No cystic structure found in the popliteal fossa.  LEFT: - There is no evidence of deep vein thrombosis in the lower extremity. However, portions of this examination were limited- see technologist comments above.  - No cystic structure found in the popliteal fossa.  *See table(s) above for measurements and observations.    Preliminary     Scheduled Meds: . baricitinib  4 mg Oral Daily  . enoxaparin (LOVENOX) injection  55 mg Subcutaneous Q24H  . ferrous sulfate  325 mg Oral Daily  . Ipratropium-Albuterol  1 puff Inhalation Q6H  . methylPREDNISolone (SOLU-MEDROL) injection  60 mg Intravenous Q12H  . oxymetazoline   1 spray Each Nare BID  . Ensure Max Protein  11 oz Oral Daily   Continuous Infusions: . remdesivir 100 mg in NS 100 mL 100 mg (07/19/20 1004)     LOS: 4 days   Time spent: 35 minutes.  Tyrone Nine, MD [facility] [email or web address] 07/20/2020, 10:56 AM

## 2020-07-20 NOTE — Progress Notes (Incomplete)
ANTICOAGULATION CONSULT NOTE   Pharmacy Consult for Heparin Indication: pulmonary embolus  Allergies  Allergen Reactions  . Shellfish Allergy Anaphylaxis and Itching  . Codeine Itching  . Mango Flavor Itching   Patient Measurements: Height: 5\' 4"  (162.6 cm) Weight: 113.9 kg (251 lb) IBW/kg (Calculated) : 54.7 Heparin Dosing Weight: 82kg  Vital Signs: Temp: 98.1 F (36.7 C) (12/10 2030) Temp Source: Oral (12/10 2030) BP: 147/67 (12/10 2030) Pulse Rate: 55 (12/10 2030)  Labs: Recent Labs    07/18/20 0420 07/20/20 0423  HGB 9.4*  --   HCT 34.9*  --   PLT 396  --   CREATININE 0.69 0.77   Estimated Creatinine Clearance: 109.9 mL/min (by C-G formula based on SCr of 0.77 mg/dL).  Medical History: Past Medical History:  Diagnosis Date  . Asthma     Medications:  Scheduled:  . amLODipine  5 mg Oral Daily  . baricitinib  4 mg Oral Daily  . dexamethasone  6 mg Oral Daily  . ferrous sulfate  325 mg Oral Daily  . Ipratropium-Albuterol  1 puff Inhalation Q6H  . oxymetazoline  1 spray Each Nare BID  . Ensure Max Protein  11 oz Oral Daily  . Rivaroxaban  15 mg Oral BID WC   Followed by  . [START ON 08/10/2020] rivaroxaban  20 mg Oral Q supper  . sodium chloride (PF)       Infusions:    Assessment: With CTangio pending, MD ordered empiric treatment dose Xarelto (pt was refusing injections - Lovenox order d/c), patient received two doses Xarelto 15mg . Treatment changed to IV Heparin - will start 12/11 at 0800 (when next Xarelto was due)  Goal of Therapy:  Heparin level 0.3-0.7 units/ml  APTT 66-102 sec Monitor platelets by anticoagulation protocol: Yes   Plan:  Discontinued Xarelto  Rabia Argote L 07/20/2020,11:48 PM

## 2020-07-20 NOTE — Progress Notes (Signed)
   07/20/20 0731  Vitals  BP (!) 160/108  MAP (mmHg) 122  BP Method Automatic  Pulse Rate (!) 53  MEWS COLOR  MEWS Score Color Green  MEWS Score  MEWS Temp 0  MEWS Systolic 0  MEWS Pulse 0  MEWS RR 0  MEWS LOC 0  MEWS Score 0   Previous BP at 0630 180/97 Pulse - 54. MD was notified. No other clinical complaints. Pt. Monitored closely.

## 2020-07-20 NOTE — Progress Notes (Signed)
Lower extremity venous bilateral study completed.  Preliminary results relayed to Victorino Dike, RN.   See CV Proc for preliminary results report.   Jean Rosenthal, RDMS

## 2020-07-21 ENCOUNTER — Inpatient Hospital Stay (HOSPITAL_COMMUNITY): Payer: HRSA Program

## 2020-07-21 DIAGNOSIS — U071 COVID-19: Secondary | ICD-10-CM | POA: Diagnosis not present

## 2020-07-21 DIAGNOSIS — D509 Iron deficiency anemia, unspecified: Secondary | ICD-10-CM | POA: Diagnosis not present

## 2020-07-21 DIAGNOSIS — J9601 Acute respiratory failure with hypoxia: Secondary | ICD-10-CM

## 2020-07-21 DIAGNOSIS — I361 Nonrheumatic tricuspid (valve) insufficiency: Secondary | ICD-10-CM

## 2020-07-21 DIAGNOSIS — J9600 Acute respiratory failure, unspecified whether with hypoxia or hypercapnia: Secondary | ICD-10-CM | POA: Diagnosis not present

## 2020-07-21 LAB — RENAL FUNCTION PANEL
Albumin: 3 g/dL — ABNORMAL LOW (ref 3.5–5.0)
Anion gap: 11 (ref 5–15)
BUN: 19 mg/dL (ref 6–20)
CO2: 25 mmol/L (ref 22–32)
Calcium: 8.6 mg/dL — ABNORMAL LOW (ref 8.9–10.3)
Chloride: 103 mmol/L (ref 98–111)
Creatinine, Ser: 0.86 mg/dL (ref 0.44–1.00)
GFR, Estimated: >60 mL/min (ref >60)
Glucose, Bld: 129 mg/dL — ABNORMAL HIGH (ref 70–99)
Phosphorus: 3.9 mg/dL (ref 2.5–4.6)
Potassium: 3.7 mmol/L (ref 3.5–5.1)
Sodium: 139 mmol/L (ref 135–145)

## 2020-07-21 LAB — ECHOCARDIOGRAM COMPLETE
Area-P 1/2: 2.56 cm2
Height: 64 in
S' Lateral: 2.7 cm
Weight: 4016.01 oz

## 2020-07-21 LAB — APTT: aPTT: 42 seconds — ABNORMAL HIGH (ref 24–36)

## 2020-07-21 LAB — HEPARIN LEVEL (UNFRACTIONATED)
Heparin Unfractionated: 1.86 IU/mL — ABNORMAL HIGH (ref 0.30–0.70)
Heparin Unfractionated: 0.44 IU/mL (ref 0.30–0.70)

## 2020-07-21 LAB — D-DIMER, QUANTITATIVE: D-Dimer, Quant: 8.4 ug/mL-FEU — ABNORMAL HIGH (ref 0.00–0.50)

## 2020-07-21 MED ORDER — HEPARIN (PORCINE) 25000 UT/250ML-% IV SOLN: 1200.0000 [IU]/h | INTRAVENOUS | Status: DC

## 2020-07-21 MED ORDER — HEPARIN BOLUS VIA INFUSION
3000.0000 [IU] | Freq: Once | INTRAVENOUS | Status: AC
  Administered 2020-07-21: 21:00:00 3000 [IU] via INTRAVENOUS
  Filled 2020-07-21: qty 3000

## 2020-07-21 MED ORDER — HEPARIN (PORCINE) 25000 UT/250ML-% IV SOLN
1500.0000 [IU]/h | INTRAVENOUS | Status: DC
  Administered 2020-07-21: 12:00:00 1200 [IU]/h via INTRAVENOUS
  Administered 2020-07-22: 02:00:00 1500 [IU]/h via INTRAVENOUS
  Filled 2020-07-21 (×2): qty 250

## 2020-07-21 NOTE — Progress Notes (Signed)
  Echocardiogram 2D Echocardiogram has been performed.  Sharon Faulkner 07/21/2020, 9:44 AM

## 2020-07-21 NOTE — Progress Notes (Signed)
Patient called nurse to room. Patient stated she had a raised bump on her hand and it's tingling after lab drew labs this evening. Provided patient with ice. Will continue to monitor.

## 2020-07-21 NOTE — Progress Notes (Signed)
ANTICOAGULATION CONSULT NOTE   Pharmacy Consult for Heparin Indication: pulmonary embolus  Allergies  Allergen Reactions  . Shellfish Allergy Anaphylaxis and Itching  . Codeine Itching  . Mango Flavor Itching   Patient Measurements: Height: 5\' 4"  (162.6 cm) Weight: 113.9 kg (251 lb) IBW/kg (Calculated) : 54.7 Heparin Dosing Weight: 82kg  Vital Signs: Temp: 98.9 F (37.2 C) (12/11 1438) Temp Source: Oral (12/11 1438) BP: 125/87 (12/11 1438) Pulse Rate: 86 (12/11 1438)  Labs: Recent Labs    07/20/20 0423 07/21/20 0442 07/21/20 1820  APTT  --   --  42*  HEPARINUNFRC  --  1.86* 0.44  CREATININE 0.77 0.86  --    Estimated Creatinine Clearance: 102.2 mL/min (by C-G formula based on SCr of 0.86 mg/dL).  Medical History: Past Medical History:  Diagnosis Date  . Asthma     Medications:  Scheduled:  . amLODipine  5 mg Oral Daily  . baricitinib  4 mg Oral Daily  . dexamethasone  6 mg Oral Daily  . ferrous sulfate  325 mg Oral Daily  . Ipratropium-Albuterol  1 puff Inhalation Q6H  . oxymetazoline  1 spray Each Nare BID  . Ensure Max Protein  11 oz Oral Daily   Infusions:  . heparin 1,200 Units/hr (07/21/20 1224)    Assessment: With CTangio pending, MD ordered empiric treatment dose Xarelto (pt was refusing injections - Lovenox order d/c), patient received two doses Xarelto 15mg . Treatment changed to IV Heparin - will start 12/11 at 0800 (when next Xarelto was due). Note that Heparin level will be elevated due to Xarelto, will titrate Heparin based on aPTT until Xarelto effect dissipates.  Goal of Therapy:  Heparin level 0.3-0.7 units/ml  APTT 66-102 sec Monitor platelets by anticoagulation protocol: Yes   Plan:   Heparin bolus 3000 units IV x 1  Increase heparin infusion to 1500 units/hr  Daily CBC and heparin level; aPTT as needed until DOAC effects dissipate  Monitor for s/s bleeding or worsening thrombosis  Ayssa Bentivegna A PharmD 07/21/2020,7:15  PM

## 2020-07-21 NOTE — Progress Notes (Signed)
PROGRESS NOTE  Sharon Faulkner  WLN:[id number] DOB: [birth year] DOA: 07/16/2020 PCP: Patient, No Pcp Per   Brief Narrative: Sharon Faulkner is a 44 y.o. female with a history of asthma who presented to the ED 12/6 with 7 days of shortness of breath found to have positive SARS-CoV-2 and diffuse patchy opacities on CXR requiring 4L O2.   Assessment & Plan: Active Problems:   Acute respiratory failure due to COVID-19 (HCC)   Microcytic anemia  Acute hypoxemic respiratory failure due to covid-19 pneumonia and RUL acute pulmonary embolism: SARS-CoV-2 PCR positive on 12/6.  - Wean oxygen as tolerated - Continue IV heparin, transition to DOAC in 24-48 hours if hemodynamically stable and pending echocardiogram (performed, pending).  - Nasal saline prn, afrin BID up to 3 days - Completed remdesivir, (12/6 - 12/10) - Continue steroid taper. CRP normalized. - Continue baricitinib. - Encourage OOB, IS, FV, and awake proning if able - Continue airborne, contact precautions for 21 days from positive testing. - Monitor CMP and inflammatory markers  RBBB: Noted on prior ECG. Stable.   Microcytic anemia, suspected iron deficiency anemia: In menstruating patient. Ferritin is wnl though this is in setting of acute infection.  - Recheck in AM. Hgb stable in 9's. - Supplement iron.  Hypokalemia:  - Resolved.  HTN:  - Started norvasc.  Anxiety, claustrophobia: Stable  Morbid obesity: Estimated body mass index is 43.08 kg/m as calculated from the following:   Height as of this encounter: 5\' 4"  (1.626 m).   Weight as of this encounter: 113.9 kg.  DVT prophylaxis: IV heparin.  Code Status: Full Family Communication: None at bedside.  Disposition Plan:  Status is: Inpatient  Remains inpatient appropriate because:Inpatient level of care appropriate due to severity of illness  Dispo: The patient is from: Home              Anticipated d/c is to: Home              Anticipated d/c date is: 1 day. CM  consulted for 30-day card for DOAC and to assist with establishing care at PCP. Pt uninsured.              Patient currently is not medically stable to d/c.  Consultants:   None  Procedures:   None  Antimicrobials:  Remdesivir 12/6 - 12/10  Subjective: Frustrated with still being in the hospital, reports shortness of breath is moderate at rest and quickly becomes severe with exertion. No chest pain. Cough is persistent and no worse.   Objective: Vitals:   07/20/20 0731 07/20/20 1339 07/20/20 2030 07/21/20 0438  BP: (!) 160/108 130/88 (!) 147/67 (!) 147/99  Pulse: (!) 53 61 (!) 55 69  Resp:  18 (!) 22 17  Temp:  98.4 F (36.9 C) 98.1 F (36.7 C) 98.3 F (36.8 C)  TempSrc:  Oral Oral Oral  SpO2:  94% 95% 98%  Weight:      Height:        Intake/Output Summary (Last 24 hours) at 07/21/2020 1244 Last data filed at 07/21/2020 1000 Gross per 24 hour  Intake 100 ml  Output --  Net 100 ml   Filed Weights   07/16/20 1608  Weight: 113.9 kg   Gen: 44 y.o. female in no distress Pulm: Tachypneic at rest, diminished bilaterally without wheezes or crackles. CV: Regular bradycardia. No murmur, rub, or gallop. No JVD, no pitting dependent edema. GI: Abdomen soft, non-tender, non-distended, with normoactive bowel sounds.  Ext: Warm, no  deformities Skin: No rashes, lesions or ulcers on visualized skin. Neuro: Alert and oriented. No focal neurological deficits. Psych: Judgement and insight appear fair. Mood euthymic & affect congruent. Behavior is appropriate.     Data Reviewed: I have personally reviewed following labs and imaging studies  CBC: Recent Labs  Lab 07/16/20 1138 07/17/20 0422 07/18/20 0420  WBC 5.7 3.5* 8.2  NEUTROABS  --  2.4 6.7  HGB 9.1* 9.1* 9.4*  HCT 32.8* 32.5* 34.9*  MCV 68.3* 68.3* 70.6*  PLT 348 400 396   Basic Metabolic Panel: Recent Labs  Lab 07/16/20 1138 07/17/20 0422 07/18/20 0420 07/20/20 0423 07/21/20 0442  NA 135 137 140 138 139   K 3.1* 4.0 4.7 4.6 3.7  CL 98 100 105 105 103  CO2 29 27 24 22 25   GLUCOSE 113* 144* 158* 141* 129*  BUN 9 13 16 18 19   CREATININE 0.77 0.57 0.69 0.77 0.86  CALCIUM 8.4* 8.7* 8.9 9.0 8.6*  MG  --  2.2 2.1  --   --   PHOS  --   --   --   --  3.9   GFR: Estimated Creatinine Clearance: 102.2 mL/min (by C-G formula based on SCr of 0.86 mg/dL). Liver Function Tests: Recent Labs  Lab 07/17/20 0422 07/18/20 0420 07/20/20 0423 07/21/20 0442  AST 33 32 36  --   ALT 21 20 43  --   ALKPHOS 53 56 61  --   BILITOT 0.4 0.5 0.4  --   PROT 6.8 6.9 6.6  --   ALBUMIN 3.3* 3.2* 3.1* 3.0*   Urine analysis:    Component Value Date/Time   COLORURINE YELLOW 06/20/2020 2029   APPEARANCEUR CLEAR 06/20/2020 2029   LABSPEC 1.023 06/20/2020 2029   PHURINE 5.0 06/20/2020 2029   GLUCOSEU NEGATIVE 06/20/2020 2029   HGBUR NEGATIVE 06/20/2020 2029   BILIRUBINUR NEGATIVE 06/20/2020 2029   KETONESUR NEGATIVE 06/20/2020 2029   PROTEINUR NEGATIVE 06/20/2020 2029   NITRITE NEGATIVE 06/20/2020 2029   LEUKOCYTESUR NEGATIVE 06/20/2020 2029   Recent Results (from the past 240 hour(s))  Resp Panel by RT-PCR (Flu A&B, Covid) Nasopharyngeal Swab     Status: Abnormal   Collection Time: 07/16/20 12:50 PM   Specimen: Nasopharyngeal Swab; Nasopharyngeal(NP) swabs in vial transport medium  Result Value Ref Range Status   SARS Coronavirus 2 by RT PCR POSITIVE (A) NEGATIVE Final    Comment: RESULT CALLED TO, READ BACK BY AND VERIFIED WITH: D,JACOB AT 1400 ON 07/16/20 BY A,MOHAMED (NOTE) SARS-CoV-2 target nucleic acids are DETECTED.  The SARS-CoV-2 RNA is generally detectable in upper respiratory specimens during the acute phase of infection. Positive results are indicative of the presence of the identified virus, but do not rule out bacterial infection or co-infection with other pathogens not detected by the test. Clinical correlation with patient history and other diagnostic information is necessary to  determine patient infection status. The expected result is Negative.  Fact Sheet for Patients: 14/06/21  Fact Sheet for Healthcare Providers: 14/6/21  This test is not yet approved or cleared by the [employer] FDA and  has been authorized for detection and/or diagnosis of SARS-CoV-2 by FDA under an Emergency Use Authorization (EUA).  This EUA will remain in effect (meaning this test  can be used) for the duration of  the COVID-19 declaration under Section 564(b)(1) of the Act, 21 U.S.C. section 360bbb-3(b)(1), unless the authorization is terminated or revoked sooner.     Influenza A by PCR NEGATIVE NEGATIVE  Final   Influenza B by PCR NEGATIVE NEGATIVE Final    Comment: (NOTE) The Xpert Xpress SARS-CoV-2/FLU/RSV plus assay is intended as an aid in the diagnosis of influenza from Nasopharyngeal swab specimens and should not be used as a sole basis for treatment. Nasal washings and aspirates are unacceptable for Xpert Xpress SARS-CoV-2/FLU/RSV testing.  Fact Sheet for Patients: [employer]  Fact Sheet for Healthcare Providers: [employer]ker[employer]it  This test is not yet approved or cleared by the [place] FDA and has been authorized for detection and/or diagnosis of SARS-CoV-2 by FDA under an Emergency Use Authorization (EUA). This EUA will remain in effect (meaning this test can be used) for the duration of the COVID-19 declaration under Section 564(b)(1) of the Act, 21 U.S.C. section 360bbb-3(b)(1), unless the authorization is terminated or revoked.  Performed at [facility], [address] [place], [place] [address]       Radiology Studies: CT ANGIO CHEST PE W OR WO CONTRAST  Addendum Date: 07/20/2020   ADDENDUM REPORT: 07/20/2020 21:33 ADDENDUM: Critical Value/emergent results were called by telephone at the time of  interpretation on 07/20/2020 at 9:33 pm to provider Linton Flemings, who verbally acknowledged these results. Electronically Signed   By: Odessa Fleming M.D.   On: 07/20/2020 21:33   Result Date: 07/20/2020 CLINICAL DATA:  44 year old female positive COVID-19. Abnormal D-dimer. Shortness of breath and cough. EXAM: CT ANGIOGRAPHY CHEST WITH CONTRAST TECHNIQUE: Multidetector CT imaging of the chest was performed using the standard protocol during bolus administration of intravenous contrast. Multiplanar CT image reconstructions and MIPs were obtained to evaluate the vascular anatomy. CONTRAST:  OMNIPAQUE IOHEXOL 350 MG/ML SOLN COMPARISON:  Portable chest 07/16/2020. FINDINGS: Cardiovascular: Good contrast bolus timing in the pulmonary arterial tree. Respiratory motion. No main pulmonary artery filling defect. However, there is loss of the normal enhancement of the proximal right upper lobe pulmonary artery beginning just distal to the bifurcation of the right main PA (series 7, image 48 and series 5, image 82. Limited detail of other lobar pulmonary arteries, with no other definite pulmonary embolus identified. Mild cardiomegaly (series 4, image 51). No pericardial effusion. Negative visible aorta. Mediastinum/Nodes: Negative.  No lymphadenopathy. Lungs/Pleura: Respiratory motion. Major airways remain patent. Widely scattered peripheral and to a lesser extent peribronchial pulmonary ground-glass opacity. Costophrenic angles are least affected. No pleural effusion. Upper Abdomen: Negative visible noncontrast liver, spleen, pancreas, adrenal glands, left kidney and bowel in the upper abdomen. Musculoskeletal: No acute osseous abnormality identified. Review of the MIP images confirms the above findings. IMPRESSION: 1. Exam significantly degraded by motion, but appearance strongly suggests acute right upper lobar pulmonary embolus. No saddle embolus. No definite additional lobar thrombus. 2. Widespread bilateral COVID-19  pneumonia.  No pleural effusion. 3. Mild cardiomegaly. Electronically Signed: By: Odessa Fleming M.D. On: 07/20/2020 21:22   VAS [place] LOWER EXTREMITY VENOUS (DVT)  Result Date: 07/20/2020  Lower Venous DVT Study Indications: Elevated d-dimer, Covid-19, tenderness in legs.  Anticoagulation: Patient refusing. Limitations: Patient unable to tolerate compressions at some segments. Involuntary patient movement. Comparison Study: No prior studies. Performing Technologist: Jean Rosenthal RDMS  Examination Guidelines: A complete evaluation includes B-mode imaging, spectral Doppler, color Doppler, and power Doppler as needed of all accessible portions of each vessel. Bilateral testing is considered an integral part of a complete examination. Limited examinations for reoccurring indications may be performed as noted. The reflux portion of the exam is performed with the patient in reverse Trendelenburg.  +---------+---------------+---------+-----------+----------+---------------+  RIGHT  Compressibility Phasicity Spontaneity Properties Thrombus Aging   +---------+---------------+---------+-----------+----------+---------------+  CFV       Full            Yes       Yes                                     +---------+---------------+---------+-----------+----------+---------------+  SFJ       Full                                                              +---------+---------------+---------+-----------+----------+---------------+  FV Prox   Full                                                              +---------+---------------+---------+-----------+----------+---------------+  FV Mid    Full            Yes       Yes                                     +---------+---------------+---------+-----------+----------+---------------+  FV Distal                 Yes       Yes                                     +---------+---------------+---------+-----------+----------+---------------+  PFV       Full                                                               +---------+---------------+---------+-----------+----------+---------------+  POP       Full            Yes       Yes                                     +---------+---------------+---------+-----------+----------+---------------+  PTV       Full                                                              +---------+---------------+---------+-----------+----------+---------------+  PERO  Patent by color  +---------+---------------+---------+-----------+----------+---------------+  Gastroc   Full                                                              +---------+---------------+---------+-----------+----------+---------------+   +---------+---------------+---------+-----------+----------+--------------+  LEFT      Compressibility Phasicity Spontaneity Properties Thrombus Aging  +---------+---------------+---------+-----------+----------+--------------+  CFV       Full            Yes       Yes                                    +---------+---------------+---------+-----------+----------+--------------+  SFJ       Full                                                             +---------+---------------+---------+-----------+----------+--------------+  FV Prox   Full                                                             +---------+---------------+---------+-----------+----------+--------------+  FV Mid    Full            Yes       Yes                                    +---------+---------------+---------+-----------+----------+--------------+  FV Distal                 Yes       Yes                                    +---------+---------------+---------+-----------+----------+--------------+  PFV       Full                                                             +---------+---------------+---------+-----------+----------+--------------+  POP       Full            Yes       Yes                                     +---------+---------------+---------+-----------+----------+--------------+  PTV       Full                                                             +---------+---------------+---------+-----------+----------+--------------+  PERO      Full                                                             +---------+---------------+---------+-----------+----------+--------------+  Gastroc   Full                                                             +---------+---------------+---------+-----------+----------+--------------+     Summary: RIGHT: - There is no evidence of deep vein thrombosis in the lower extremity. However, portions of this examination were limited- see technologist comments above.  - No cystic structure found in the popliteal fossa.  LEFT: - There is no evidence of deep vein thrombosis in the lower extremity. However, portions of this examination were limited- see technologist comments above.  - No cystic structure found in the popliteal fossa.  *See table(s) above for measurements and observations.    Preliminary     Scheduled Meds:  amLODipine  5 mg Oral Daily   baricitinib  4 mg Oral Daily   dexamethasone  6 mg Oral Daily   ferrous sulfate  325 mg Oral Daily   Ipratropium-Albuterol  1 puff Inhalation Q6H   oxymetazoline  1 spray Each Nare BID   Ensure Max Protein  11 oz Oral Daily   Continuous Infusions:  heparin 1,200 Units/hr (07/21/20 1224)     LOS: 5 days   Time spent: 35 minutes.  Tyrone Nineyan B Loomis Anacker, MD [facility] [email or web address] 07/21/2020, 12:44 PM

## 2020-07-21 NOTE — Plan of Care (Signed)
  Problem: Clinical Measurements: Goal: Will remain free from infection Outcome: Progressing Goal: Respiratory complications will improve Outcome: Progressing   Problem: Activity: Goal: Risk for activity intolerance will decrease Outcome: Progressing   Problem: Coping: Goal: Level of anxiety will decrease Outcome: Progressing   Problem: Pain Managment: Goal: General experience of comfort will improve Outcome: Progressing   Problem: Respiratory: Goal: Will maintain a patent airway Outcome: Progressing

## 2020-07-22 DIAGNOSIS — U071 COVID-19: Secondary | ICD-10-CM | POA: Diagnosis not present

## 2020-07-22 DIAGNOSIS — I2699 Other pulmonary embolism without acute cor pulmonale: Secondary | ICD-10-CM | POA: Diagnosis not present

## 2020-07-22 DIAGNOSIS — D509 Iron deficiency anemia, unspecified: Secondary | ICD-10-CM | POA: Diagnosis not present

## 2020-07-22 DIAGNOSIS — J9600 Acute respiratory failure, unspecified whether with hypoxia or hypercapnia: Secondary | ICD-10-CM | POA: Diagnosis not present

## 2020-07-22 LAB — CBC
HCT: 34.9 % — ABNORMAL LOW (ref 36.0–46.0)
Hemoglobin: 9.7 g/dL — ABNORMAL LOW (ref 12.0–15.0)
MCH: 19.4 pg — ABNORMAL LOW (ref 26.0–34.0)
MCHC: 27.8 g/dL — ABNORMAL LOW (ref 30.0–36.0)
MCV: 69.9 fL — ABNORMAL LOW (ref 80.0–100.0)
Platelets: 473 10*3/uL — ABNORMAL HIGH (ref 150–400)
RBC: 4.99 MIL/uL (ref 3.87–5.11)
RDW: 22.9 % — ABNORMAL HIGH (ref 11.5–15.5)
WBC: 13.8 10*3/uL — ABNORMAL HIGH (ref 4.0–10.5)
nRBC: 0.2 % (ref 0.0–0.2)

## 2020-07-22 LAB — HEPARIN LEVEL (UNFRACTIONATED): Heparin Unfractionated: 0.5 IU/mL (ref 0.30–0.70)

## 2020-07-22 LAB — APTT: aPTT: 60 seconds — ABNORMAL HIGH (ref 24–36)

## 2020-07-22 LAB — D-DIMER, QUANTITATIVE: D-Dimer, Quant: 7.47 ug/mL-FEU — ABNORMAL HIGH (ref 0.00–0.50)

## 2020-07-22 MED ORDER — DEXAMETHASONE 6 MG PO TABS: 6.0000 mg | ORAL_TABLET | Freq: Every day | ORAL | 0 refills | Status: DC

## 2020-07-22 MED ORDER — APIXABAN 5 MG PO TABS
10.0000 mg | ORAL_TABLET | Freq: Two times a day (BID) | ORAL | Status: DC
  Administered 2020-07-22: 10:00:00 10 mg via ORAL
  Filled 2020-07-22: qty 2

## 2020-07-22 MED ORDER — APIXABAN (ELIQUIS) VTE STARTER PACK (10MG AND 5MG): ORAL_TABLET | ORAL | 0 refills | Status: DC

## 2020-07-22 MED ORDER — HEPARIN (PORCINE) 25000 UT/250ML-% IV SOLN: 1700.0000 [IU]/h | INTRAVENOUS | Status: DC

## 2020-07-22 MED ORDER — APIXABAN 5 MG PO TABS: 5.0000 mg | ORAL_TABLET | Freq: Two times a day (BID) | ORAL | Status: DC

## 2020-07-22 NOTE — Progress Notes (Signed)
ANTICOAGULATION CONSULT NOTE   Pharmacy Consult for Heparin Indication: pulmonary embolus  Allergies  Allergen Reactions  . Shellfish Allergy Anaphylaxis and Itching  . Codeine Itching  . Mango Flavor Itching   Patient Measurements: Height: 5\' 4"  (162.6 cm) Weight: 113.9 kg (251 lb) IBW/kg (Calculated) : 54.7 Heparin Dosing Weight: 82kg  Vital Signs: Temp: 98.7 F (37.1 C) (12/11 2032) Temp Source: Oral (12/11 2032) BP: 139/94 (12/11 2032) Pulse Rate: 77 (12/11 2032)  Labs: Recent Labs    07/20/20 0423 07/21/20 0442 07/21/20 1820 07/22/20 0241  HGB  --   --   --  9.7*  HCT  --   --   --  34.9*  PLT  --   --   --  473*  APTT  --   --  42* 60*  HEPARINUNFRC  --  1.86* 0.44 0.50  CREATININE 0.77 0.86  --   --    Estimated Creatinine Clearance: 102.2 mL/min (by C-G formula based on SCr of 0.86 mg/dL).  Medical History: Past Medical History:  Diagnosis Date  . Asthma     Medications:  Scheduled:  . amLODipine  5 mg Oral Daily  . baricitinib  4 mg Oral Daily  . dexamethasone  6 mg Oral Daily  . ferrous sulfate  325 mg Oral Daily  . Ipratropium-Albuterol  1 puff Inhalation Q6H  . Ensure Max Protein  11 oz Oral Daily   Infusions:  . heparin 1,500 Units/hr (07/22/20 0220)    Assessment: With CTangio pending, MD ordered empiric treatment dose Xarelto (pt was refusing injections - Lovenox order d/c), patient received two doses Xarelto 15mg . Treatment changed to IV Heparin - will start 12/11 at 0800 (when next Xarelto was due). Note that Heparin level will be elevated due to Xarelto, will titrate Heparin based on aPTT until Xarelto effect dissipates.  Today, 07/22/2020  0241 (daily) Hep level 0.50, aPTT 60 seconds APTT still slightly below therapeutic range, Hep level and aPTT do not correlate yet Adjust Heparin based on aPTT   Goal of Therapy:  Heparin level 0.3-0.7 units/ml  APTT 66-102 sec Monitor platelets by anticoagulation protocol: Yes   Plan:    Increase heparin infusion to 1700 units/hr  Daily CBC and heparin level; aPTT as needed until DOAC effects dissipate  12N aPTT, Hep level  Monitor for s/s bleeding or worsening thrombosis  14/11 PharmD 07/22/2020,4:01 AM

## 2020-07-22 NOTE — Discharge Summary (Signed)
Physician Discharge Summary  Sharon Faulkner ZOX:[id number] DOB: Dec 25, 1975 DOA: 07/16/2020  PCP: Patient, No Pcp Per  Admit date: 07/16/2020 Discharge date: 07/22/2020  Admitted From: Home Disposition: Home   Recommendations for Outpatient Follow-up:  1. Establish care with PCP on/after 12/27 (end of isolation period) 2. Continue eliquis for provoked PE. 30-day card provided at discharge as pt is uninsured.  Home Health: None Equipment/Devices: None Discharge Condition: Stable CODE STATUS: Full Diet recommendation: Regular  Brief/Interim Summary: Sharon Faulkner is a 44 y.o. female with a history of asthma who presented to the ED 12/6 with 7 days of shortness of breath found to have positive SARS-CoV-2 and diffuse patchy opacities on CXR requiring 4L O2. She was admitted, started on remdesivir, steroids, and baricitinib when oxygen requirements worsened. D-dimer also rose abruptly. Venous U/S revealed no DVT but CTA chest revealed a RUL PE for which anticoagulation was started. Hypoxia has since resolved and the patient remains hemodynamically stable for discharge  Discharge Diagnoses:  Active Problems:   Acute respiratory failure due to COVID-19 (HCC)   Microcytic anemia  Acute hypoxemic respiratory failure due to covid-19 pneumonia and RUL acute pulmonary embolism: SARS-CoV-2 PCR positive on 12/6.  - Weaned from oxygen. No hypoxia at rest or with exertion. Completed remdesivir, (12/6 - 12/10). Received baricitinib while admitted.  - Continue steroids x10 total days inclusive of admission. - Continue anticoagulation x3-6 months with eliquis. No RV strain on echo. - Continue airborne, contact precautions for 21 days from positive testing.  RBBB: Noted on prior ECG. Stable.   Microcytic anemia, suspected iron deficiency anemia: In menstruating patient. Ferritin is wnl though this is in setting of acute infection.  - Hgb remained stable in 9's after initiation of anticoagulation. If  menorrhagia occurs, may need management (e.g. megace). - Supplement iron.  Hypokalemia:  - Resolved.  HTN:  - PCP follow up  Anxiety, claustrophobia: Stable  Morbid obesity: Estimated body mass index is 43.08 kg/m   Discharge Instructions Discharge Instructions    Call MD for:  difficulty breathing, headache or visual disturbances   Complete by: As directed    Call MD for:  persistant dizziness or light-headedness   Complete by: As directed    Discharge instructions   Complete by: As directed    You are being discharged from the hospital after treatment for covid-19 infection complicated by a blood clot in the lung (acute pulmonary embolism). You are felt to be stable enough to no longer require inpatient monitoring, testing, and treatment, though you will need to follow the recommendations below: - Continue taking decadron for 3 more days starting tomorrow to complete the steroid course for covid pneumonia.  - Continue taking the blood thinner eliquis as directed. This was sent to your pharmacy. It should be free of charge for the first prescription and will need to be continued by a doctor after you leave here.  - Call the community health and wellness clinic tomorrow to schedule an appointment within the next month (at the most). You may also call the covid clinic at [facility] to see if they can get you in if [facility] is not available. Of course, seek medical attention right away if your symptoms get WORSE.  - Per CDC guidelines, you will need to remain in isolation for 21 days from your first positive covid test. - You are still encouraged to get a covid vaccination between 21 days (after isolation period ends) and 90 days (before immunity is thought to wear off).  Directions for you at home:  Wear a facemask You should wear a facemask that covers your nose and mouth when you are in the same room with other people and when you visit a healthcare provider. People who live with or  visit you should also wear a facemask while they are in the same room with you.  Separate yourself from other people in your home As much as possible, you should stay in a different room from other people in your home. Also, you should use a separate bathroom, if available.  Avoid sharing household items You should not share dishes, drinking glasses, cups, eating utensils, towels, bedding, or other items with other people in your home. After using these items, you should wash them thoroughly with soap and water.  Cover your coughs and sneezes Cover your mouth and nose with a tissue when you cough or sneeze, or you can cough or sneeze into your sleeve. Throw used tissues in a lined trash can, and immediately wash your hands with soap and water for at least 20 seconds or use an alcohol-based hand rub.  Wash your [employer] your hands often and thoroughly with soap and water for at least 20 seconds. You can use an alcohol-based hand sanitizer if soap and water are not available and if your hands are not visibly dirty. Avoid touching your eyes, nose, and mouth with unwashed hands.  Directions for those who live with, or provide care at home for you:  Limit the number of people who have contact with the patient If possible, have only one caregiver for the patient. Other household members should stay in another home or place of residence. If this is not possible, they should stay in another room, or be separated from the patient as much as possible. Use a separate bathroom, if available. Restrict visitors who do not have an essential need to be in the home.  Ensure good ventilation Make sure that shared spaces in the home have good air flow, such as from an air conditioner or an opened window, weather permitting.  Wash your hands often Wash your hands often and thoroughly with soap and water for at least 20 seconds. You can use an alcohol based hand sanitizer if soap and water are not  available and if your hands are not visibly dirty. Avoid touching your eyes, nose, and mouth with unwashed hands. Use disposable paper towels to dry your hands. If not available, use dedicated cloth towels and replace them when they become wet.  Wear a facemask and gloves Wear a disposable facemask at all times in the room and gloves when you touch or have contact with the patient's blood, body fluids, and/or secretions or excretions, such as sweat, saliva, sputum, nasal mucus, vomit, urine, or feces.  Ensure the mask fits over your nose and mouth tightly, and do not touch it during use. Throw out disposable facemasks and gloves after using them. Do not reuse. Wash your hands immediately after removing your facemask and gloves. If your personal clothing becomes contaminated, carefully remove clothing and launder. Wash your hands after handling contaminated clothing. Place all used disposable facemasks, gloves, and other waste in a lined container before disposing them with other household waste. Remove gloves and wash your hands immediately after handling these items.  Do not share dishes, glasses, or other household items with the patient Avoid sharing household items. You should not share dishes, drinking glasses, cups, eating utensils, towels, bedding, or other items with a  patient who is confirmed to have, or being evaluated for, COVID-19 infection. After the person uses these items, you should wash them thoroughly with soap and water.  Wash laundry thoroughly Immediately remove and wash clothes or bedding that have blood, body fluids, and/or secretions or excretions, such as sweat, saliva, sputum, nasal mucus, vomit, urine, or feces, on them. Wear gloves when handling laundry from the patient. Read and follow directions on labels of laundry or clothing items and detergent. In general, wash and dry with the warmest temperatures recommended on the label.  Clean all areas the individual has  used often Clean all touchable surfaces, such as counters, tabletops, doorknobs, bathroom fixtures, toilets, phones, keyboards, tablets, and bedside tables, every day. Also, clean any surfaces that may have blood, body fluids, and/or secretions or excretions on them. Wear gloves when cleaning surfaces the patient has come in contact with. Use a diluted bleach solution (e.g., dilute bleach with 1 part bleach and 10 parts water) or a household disinfectant with a label that says EPA-registered for coronaviruses. To make a bleach solution at home, add 1 tablespoon of bleach to 1 quart (4 cups) of water. For a larger supply, add  cup of bleach to 1 gallon (16 cups) of water. Read labels of cleaning products and follow recommendations provided on product labels. Labels contain instructions for safe and effective use of the cleaning product including precautions you should take when applying the product, such as wearing gloves or eye protection and making sure you have good ventilation during use of the product. Remove gloves and wash hands immediately after cleaning.  Monitor yourself for signs and symptoms of illness Caregivers and household members are considered close contacts, should monitor their health, and will be asked to limit movement outside of the home to the extent possible. Follow the monitoring steps for close contacts listed on the symptom monitoring form.  If you have additional questions, contact your local health department or call the epidemiologist on call at [phone] (available 24/7). This guidance is subject to change. For the most up-to-date guidance from [facility], please refer to their website: [email or web address]   MyChart COVID-19 home monitoring program   Complete by: Jul 22, 2020    Is the patient willing to use the MyChart Mobile App for home monitoring?: Yes     Allergies as of 07/22/2020      Reactions   Shellfish  Allergy Anaphylaxis, Itching   Codeine Itching   Mango Flavor Itching      Medication List    STOP taking these medications   prochlorperazine 10 MG tablet Commonly known as: COMPAZINE     TAKE these medications   albuterol 108 (90 Base) MCG/ACT inhaler Commonly known as: VENTOLIN HFA Inhale 2 puffs into the lungs every 6 (six) hours as needed for wheezing or shortness of breath.   Apixaban Starter Pack (10mg  and 5mg ) Commonly known as: ELIQUIS STARTER PACK Take as directed on package: start with two-5mg  tablets twice daily for 7 days. On day 8, switch to one-5mg  tablet twice daily.   dexamethasone 6 MG tablet Commonly known as: DECADRON Take 1 tablet (6 mg total) by mouth daily. Start taking on: July 23, 2020   ferrous sulfate 325 (65 FE) MG tablet Take 1 tablet (325 mg total) by mouth daily.       Follow-up Information    [facility]. Schedule an appointment as soon as possible for a visit.   Contact information: [address] [place] [address] [phone]       [facility] .   Contact information: [address] [place] [address] [phone]             Allergies  Allergen Reactions  . Shellfish Allergy Anaphylaxis and Itching  . Codeine Itching  . Mango Flavor Itching    Consultations:  Pharmacy  Procedures/Studies: CT ANGIO CHEST PE W OR WO CONTRAST  Addendum Date: 07/20/2020   ADDENDUM REPORT: 07/20/2020 21:33 ADDENDUM: Critical Value/emergent results were called by telephone at the time of interpretation on 07/20/2020 at 9:33 pm to provider Linton Flemings, who verbally acknowledged these results. Electronically Signed   By: Odessa Fleming M.D.   On: 07/20/2020 21:33   Result Date: 07/20/2020 CLINICAL DATA:  44 year old female positive COVID-19. Abnormal D-dimer. Shortness of breath and cough. EXAM: CT ANGIOGRAPHY CHEST WITH CONTRAST TECHNIQUE:  Multidetector CT imaging of the chest was performed using the standard protocol during bolus administration of intravenous contrast. Multiplanar CT image reconstructions and MIPs were obtained to evaluate the vascular anatomy. CONTRAST:  OMNIPAQUE IOHEXOL 350 MG/ML SOLN COMPARISON:  Portable chest 07/16/2020. FINDINGS: Cardiovascular: Good contrast bolus timing in the pulmonary arterial tree. Respiratory motion. No main pulmonary artery filling defect. However, there is loss of the normal enhancement of the proximal right upper lobe pulmonary artery beginning just distal to the bifurcation of the right main PA (series 7, image 48 and series 5, image 82. Limited detail of other lobar pulmonary arteries, with no other definite pulmonary embolus identified. Mild cardiomegaly (series 4, image 51). No pericardial effusion. Negative visible aorta. Mediastinum/Nodes: Negative.  No lymphadenopathy. Lungs/Pleura: Respiratory motion. Major airways remain patent. Widely scattered peripheral and to a lesser extent peribronchial pulmonary ground-glass opacity. Costophrenic angles are least affected. No pleural effusion. Upper Abdomen: Negative visible noncontrast liver, spleen, pancreas, adrenal glands, left kidney and bowel in the upper abdomen. Musculoskeletal: No acute osseous abnormality identified. Review of the MIP images confirms the above findings. IMPRESSION: 1. Exam significantly degraded by motion, but appearance strongly suggests acute right upper lobar pulmonary embolus. No saddle embolus. No definite additional lobar thrombus. 2. Widespread bilateral COVID-19 pneumonia.  No pleural effusion. 3. Mild cardiomegaly. Electronically Signed: By: Odessa Fleming M.D. On: 07/20/2020 21:22   DG Chest Portable 1 View  Result Date: 07/16/2020 CLINICAL DATA:  Shortness of breath.  Hypoxia. EXAM: PORTABLE CHEST 1 VIEW COMPARISON:  Chest radiographs April 23, 2015. FINDINGS: Low lung volumes with diffuse interstitial  prominence and patchy airspace opacities. No visible pleural effusions or pneumothorax. Enlarged cardiac silhouette. No acute osseous abnormality. IMPRESSION: 1. Low lung volumes with diffuse interstitial prominence and patchy airspace opacities. Findings could represent pulmonary edema and/or pneumonia. 2. Cardiomegaly. Electronically Signed   By: Feliberto Harts MD   On: 07/16/2020 12:48   ECHOCARDIOGRAM COMPLETE  Result Date: 07/21/2020    ECHOCARDIOGRAM REPORT   Patient Name:   Sharon Faulkner Date of Exam: 07/21/2020 Medical Rec #:  [id number]    Height:       64.0 in Accession #:    [id number]   Weight:       251.0 lb Date of Birth:  [birth year]     BSA:          2.155 m Patient Age:    44 years     BP:           147/99 mmHg Patient Gender: F  HR:           73 bpm. Exam Location:  Inpatient Procedure: 2D Echo, Color Doppler and Cardiac Doppler Indications:    Pulmonary Embolus 415.19 / I26.99  History:        Patient has no prior history of Echocardiogram examinations.  Sonographer:    Eulah Pont RDCS Referring Phys: [id number] IMPRESSIONS  1. Left ventricular ejection fraction, by estimation, is 65 to 70%. The left ventricle has normal function. The left ventricle has no regional wall motion abnormalities. There is moderate concentric left ventricular hypertrophy. Left ventricular diastolic parameters are consistent with Grade I diastolic dysfunction (impaired relaxation). Elevated left atrial pressure.  2. Right ventricular systolic function is normal. The right ventricular size is mildly enlarged. There is normal pulmonary artery systolic pressure. The estimated right ventricular systolic pressure is 28.8 mmHg.  3. Left atrial size was mildly dilated.  4. Right atrial size was mildly dilated.  5. The pericardial effusion is posterior to the left ventricle.  6. The mitral valve is normal in structure. Trivial mitral valve regurgitation. No evidence of mitral stenosis.  7. The aortic  valve is normal in structure. Aortic valve regurgitation is not visualized. No aortic stenosis is present.  8. The inferior vena cava is normal in size with greater than 50% respiratory variability, suggesting right atrial pressure of 3 mmHg. FINDINGS  Left Ventricle: Left ventricular ejection fraction, by estimation, is 65 to 70%. The left ventricle has normal function. The left ventricle has no regional wall motion abnormalities. The left ventricular internal cavity size was normal in size. There is  moderate concentric left ventricular hypertrophy. Left ventricular diastolic parameters are consistent with Grade I diastolic dysfunction (impaired relaxation). Elevated left atrial pressure. Right Ventricle: The right ventricular size is mildly enlarged. No increase in right ventricular wall thickness. Right ventricular systolic function is normal. There is normal pulmonary artery systolic pressure. The tricuspid regurgitant velocity is 2.54  m/s, and with an assumed right atrial pressure of 3 mmHg, the estimated right ventricular systolic pressure is 28.8 mmHg. Left Atrium: Left atrial size was mildly dilated. Right Atrium: Right atrial size was mildly dilated. Pericardium: Trivial pericardial effusion is present. The pericardial effusion is posterior to the left ventricle. Mitral Valve: The mitral valve is normal in structure. Trivial mitral valve regurgitation. No evidence of mitral valve stenosis. Tricuspid Valve: The tricuspid valve is normal in structure. Tricuspid valve regurgitation is mild . No evidence of tricuspid stenosis. Aortic Valve: The aortic valve is normal in structure. Aortic valve regurgitation is not visualized. No aortic stenosis is present. Pulmonic Valve: The pulmonic valve was normal in structure. Pulmonic valve regurgitation is trivial. No evidence of pulmonic stenosis. Aorta: The aortic root is normal in size and structure. Venous: The inferior vena cava is normal in size with greater than  50% respiratory variability, suggesting right atrial pressure of 3 mmHg. IAS/Shunts: No atrial level shunt detected by color flow Doppler.  LEFT VENTRICLE PLAX 2D LVIDd:         5.10 cm  Diastology LVIDs:         2.70 cm  LV e' medial:    5.33 cm/s LV PW:         1.40 cm  LV E/e' medial:  12.3 LV IVS:        1.30 cm  LV e' lateral:   9.90 cm/s LVOT diam:     2.20 cm  LV E/e' lateral: 6.6 LV SV:  97 LV SV Index:   45 LVOT Area:     3.80 cm  RIGHT VENTRICLE RV S prime:     16.40 cm/s TAPSE (M-mode): 2.4 cm LEFT ATRIUM             Index       RIGHT ATRIUM           Index LA diam:        3.70 cm 1.72 cm/m  RA Area:     17.10 cm LA Vol (A2C):   39.9 ml 18.52 ml/m RA Volume:   40.60 ml  18.84 ml/m LA Vol (A4C):   65.6 ml 30.45 ml/m LA Biplane Vol: 54.1 ml 25.11 ml/m  AORTIC VALVE LVOT Vmax:   137.00 cm/s LVOT Vmean:  94.900 cm/s LVOT VTI:    0.256 m  AORTA Ao Root diam: 3.20 cm Ao Asc diam:  3.50 cm MITRAL VALVE               TRICUSPID VALVE MV Area (PHT): 2.56 cm    TR Peak grad:   25.8 mmHg MV Decel Time: 296 msec    TR Vmax:        254.00 cm/s MV E velocity: 65.60 cm/s MV A velocity: 71.00 cm/s  SHUNTS MV E/A ratio:  0.92        Systemic VTI:  0.26 m                            Systemic Diam: 2.20 cm Tobias Alexander MD Electronically signed by Tobias Alexander MD Signature Date/Time: 07/21/2020/12:50:42 PM    Final    VAS [place] LOWER EXTREMITY VENOUS (DVT)  Result Date: 07/20/2020  Lower Venous DVT Study Indications: Elevated d-dimer, Covid-19, tenderness in legs.  Anticoagulation: Patient refusing. Limitations: Patient unable to tolerate compressions at some segments. Involuntary patient movement. Comparison Study: No prior studies. Performing Technologist: Jean Rosenthal RDMS  Examination Guidelines: A complete evaluation includes B-mode imaging, spectral Doppler, color Doppler, and power Doppler as needed of all accessible portions of each vessel. Bilateral testing is considered an integral part of a  complete examination. Limited examinations for reoccurring indications may be performed as noted. The reflux portion of the exam is performed with the patient in reverse Trendelenburg.  +---------+---------------+---------+-----------+----------+---------------+ RIGHT    CompressibilityPhasicitySpontaneityPropertiesThrombus Aging  +---------+---------------+---------+-----------+----------+---------------+ CFV      Full           Yes      Yes                                  +---------+---------------+---------+-----------+----------+---------------+ SFJ      Full                                                         +---------+---------------+---------+-----------+----------+---------------+ FV Prox  Full                                                         +---------+---------------+---------+-----------+----------+---------------+ FV Mid   Full  Yes      Yes                                  +---------+---------------+---------+-----------+----------+---------------+ FV Distal               Yes      Yes                                  +---------+---------------+---------+-----------+----------+---------------+ PFV      Full                                                         +---------+---------------+---------+-----------+----------+---------------+ POP      Full           Yes      Yes                                  +---------+---------------+---------+-----------+----------+---------------+ PTV      Full                                                         +---------+---------------+---------+-----------+----------+---------------+ PERO                                                  Patent by color +---------+---------------+---------+-----------+----------+---------------+ Gastroc  Full                                                          +---------+---------------+---------+-----------+----------+---------------+   +---------+---------------+---------+-----------+----------+--------------+ LEFT     CompressibilityPhasicitySpontaneityPropertiesThrombus Aging +---------+---------------+---------+-----------+----------+--------------+ CFV      Full           Yes      Yes                                 +---------+---------------+---------+-----------+----------+--------------+ SFJ      Full                                                        +---------+---------------+---------+-----------+----------+--------------+ FV Prox  Full                                                        +---------+---------------+---------+-----------+----------+--------------+ FV Mid   Full  Yes      Yes                                 +---------+---------------+---------+-----------+----------+--------------+ FV Distal               Yes      Yes                                 +---------+---------------+---------+-----------+----------+--------------+ PFV      Full                                                        +---------+---------------+---------+-----------+----------+--------------+ POP      Full           Yes      Yes                                 +---------+---------------+---------+-----------+----------+--------------+ PTV      Full                                                        +---------+---------------+---------+-----------+----------+--------------+ PERO     Full                                                        +---------+---------------+---------+-----------+----------+--------------+ Gastroc  Full                                                        +---------+---------------+---------+-----------+----------+--------------+     Summary: RIGHT: - There is no evidence of deep vein thrombosis in the lower extremity. However, portions of this  examination were limited- see technologist comments above.  - No cystic structure found in the popliteal fossa.  LEFT: - There is no evidence of deep vein thrombosis in the lower extremity. However, portions of this examination were limited- see technologist comments above.  - No cystic structure found in the popliteal fossa.  *See table(s) above for measurements and observations.    Preliminary     Subjective: Feels well, no dyspnea at rest, still decreased exertional capacity from baseline but improving steadily. No hypoxia when ambulating. No chest pain or leg swelling. No bleeding. Wants to go home.  Discharge Exam: Vitals:   07/21/20 2032 07/22/20 0420  BP: (!) 139/94 (!) 159/120  Pulse: 77 86  Resp: (!) 22 17  Temp: 98.7 F (37.1 C) 98.7 F (37.1 C)  SpO2: 97% 97%   General: Pt is alert, awake, not in acute distress Cardiovascular: RRR, S1/S2 +, no rubs, no gallops Respiratory: CTA bilaterally, no wheezing, no rhonchi Abdominal: Soft, NT, ND, bowel sounds + Extremities: No edema,  no cyanosis  Labs: BNP (last 3 results) No results for input(s): BNP in the last 8760 hours. Basic Metabolic Panel: Recent Labs  Lab 07/16/20 1138 07/17/20 0422 07/18/20 0420 07/20/20 0423 07/21/20 0442  NA 135 137 140 138 139  K 3.1* 4.0 4.7 4.6 3.7  CL 98 100 105 105 103  CO2 29 27 24 22 25   GLUCOSE 113* 144* 158* 141* 129*  BUN 9 13 16 18 19   CREATININE 0.77 0.57 0.69 0.77 0.86  CALCIUM 8.4* 8.7* 8.9 9.0 8.6*  MG  --  2.2 2.1  --   --   PHOS  --   --   --   --  3.9   Liver Function Tests: Recent Labs  Lab 07/17/20 0422 07/18/20 0420 07/20/20 0423 07/21/20 0442  AST 33 32 36  --   ALT 21 20 43  --   ALKPHOS 53 56 61  --   BILITOT 0.4 0.5 0.4  --   PROT 6.8 6.9 6.6  --   ALBUMIN 3.3* 3.2* 3.1* 3.0*   No results for input(s): LIPASE, AMYLASE in the last 168 hours. No results for input(s): AMMONIA in the last 168 hours. CBC: Recent Labs  Lab 07/16/20 1138 07/17/20 0422  07/18/20 0420 07/22/20 0241  WBC 5.7 3.5* 8.2 13.8*  NEUTROABS  --  2.4 6.7  --   HGB 9.1* 9.1* 9.4* 9.7*  HCT 32.8* 32.5* 34.9* 34.9*  MCV 68.3* 68.3* 70.6* 69.9*  PLT 348 400 396 473*   Cardiac Enzymes: No results for input(s): CKTOTAL, CKMB, CKMBINDEX, TROPONINI in the last 168 hours. BNP: Invalid input(s): POCBNP CBG: No results for input(s): GLUCAP in the last 168 hours. D-Dimer Recent Labs    07/21/20 0442 07/22/20 0241  DDIMER 8.40* 7.47*   Hgb A1c No results for input(s): HGBA1C in the last 72 hours. Lipid Profile No results for input(s): CHOL, HDL, LDLCALC, TRIG, CHOLHDL, LDLDIRECT in the last 72 hours. Thyroid function studies No results for input(s): TSH, T4TOTAL, T3FREE, THYROIDAB in the last 72 hours.  Invalid input(s): FREET3 Anemia work up No results for input(s): VITAMINB12, FOLATE, FERRITIN, TIBC, IRON, RETICCTPCT in the last 72 hours. Urinalysis    Component Value Date/Time   COLORURINE YELLOW 06/20/2020 2029   APPEARANCEUR CLEAR 06/20/2020 2029   LABSPEC 1.023 06/20/2020 2029   PHURINE 5.0 06/20/2020 2029   GLUCOSEU NEGATIVE 06/20/2020 2029   HGBUR NEGATIVE 06/20/2020 2029   BILIRUBINUR NEGATIVE 06/20/2020 2029   KETONESUR NEGATIVE 06/20/2020 2029   PROTEINUR NEGATIVE 06/20/2020 2029   NITRITE NEGATIVE 06/20/2020 2029   LEUKOCYTESUR NEGATIVE 06/20/2020 2029    Microbiology Recent Results (from the past 240 hour(s))  Resp Panel by RT-PCR (Flu A&B, Covid) Nasopharyngeal Swab     Status: Abnormal   Collection Time: 07/16/20 12:50 PM   Specimen: Nasopharyngeal Swab; Nasopharyngeal(NP) swabs in vial transport medium  Result Value Ref Range Status   SARS Coronavirus 2 by RT PCR POSITIVE (A) NEGATIVE Final    Comment: RESULT CALLED TO, READ BACK BY AND VERIFIED WITH: D,JACOB AT 1400 ON 07/16/20 BY A,MOHAMED (NOTE) SARS-CoV-2 target nucleic acids are DETECTED.  The SARS-CoV-2 RNA is generally detectable in upper respiratory specimens during the  acute phase of infection. Positive results are indicative of the presence of the identified virus, but do not rule out bacterial infection or co-infection with other pathogens not detected by the test. Clinical correlation with patient history and other diagnostic information is necessary to determine patient infection status. The  expected result is Negative.  Fact Sheet for Patients: BloggerCourse.com  Fact Sheet for Healthcare Providers: SeriousBroker.it  This test is not yet approved or cleared by the [place] FDA and  has been authorized for detection and/or diagnosis of SARS-CoV-2 by FDA under an Emergency Use Authorization (EUA).  This EUA will remain in effect (meaning this test  can be used) for the duration of  the COVID-19 declaration under Section 564(b)(1) of the Act, 21 U.S.C. section 360bbb-3(b)(1), unless the authorization is terminated or revoked sooner.     Influenza A by PCR NEGATIVE NEGATIVE Final   Influenza B by PCR NEGATIVE NEGATIVE Final    Comment: (NOTE) The Xpert Xpress SARS-CoV-2/FLU/RSV plus assay is intended as an aid in the diagnosis of influenza from Nasopharyngeal swab specimens and should not be used as a sole basis for treatment. Nasal washings and aspirates are unacceptable for Xpert Xpress SARS-CoV-2/FLU/RSV testing.  Fact Sheet for Patients: BloggerCourse.com  Fact Sheet for Healthcare Providers: SeriousBroker.it  This test is not yet approved or cleared by the [place] FDA and has been authorized for detection and/or diagnosis of SARS-CoV-2 by FDA under an Emergency Use Authorization (EUA). This EUA will remain in effect (meaning this test can be used) for the duration of the COVID-19 declaration under Section 564(b)(1) of the Act, 21 U.S.C. section 360bbb-3(b)(1), unless the authorization is terminated or revoked.  Performed at  [facility], [address] [place], [place] [address]     Time coordinating discharge: Approximately 40 minutes  Tyrone Nine, MD  [facility] 07/22/2020, 9:07 AM

## 2020-07-22 NOTE — Progress Notes (Signed)
Patient will be discharging later this early afternoon with family. Her belongings were returned to her. She was provided information on her medications, along with a card for one of her medications. Education on her medications will be provided.

## 2020-07-22 NOTE — Progress Notes (Signed)
ANTICOAGULATION CONSULT NOTE - Follow Up Consult  Pharmacy Consult for Apixaban Indication: pulmonary embolus  Allergies  Allergen Reactions  . Shellfish Allergy Anaphylaxis and Itching  . Codeine Itching  . Mango Flavor Itching    Patient Measurements: Height: 5\' 4"  (162.6 cm) Weight: 113.9 kg (251 lb) IBW/kg (Calculated) : 54.7 Heparin Dosing Weight:   Vital Signs: Temp: 98.7 F (37.1 C) (12/12 0420) Temp Source: Oral (12/12 0420) BP: 159/120 (12/12 0420) Pulse Rate: 86 (12/12 0420)  Labs: Recent Labs    07/20/20 0423 07/21/20 0442 07/21/20 1820 07/22/20 0241  HGB  --   --   --  9.7*  HCT  --   --   --  34.9*  PLT  --   --   --  473*  APTT  --   --  42* 60*  HEPARINUNFRC  --  1.86* 0.44 0.50  CREATININE 0.77 0.86  --   --     Estimated Creatinine Clearance: 102.2 mL/min (by C-G formula based on SCr of 0.86 mg/dL).   Medications:  Scheduled:  . amLODipine  5 mg Oral Daily  . baricitinib  4 mg Oral Daily  . dexamethasone  6 mg Oral Daily  . ferrous sulfate  325 mg Oral Daily  . Ipratropium-Albuterol  1 puff Inhalation Q6H  . Ensure Max Protein  11 oz Oral Daily   Infusions:  . heparin 1,700 Units/hr (07/22/20 0617)    Assessment: Pharmacy is consulted to dose heparin for suspected PE.  She received 2 doses of Xarelto on 12/10 since patient was refusing Lovenox injections.  Pharmacy is now consulted to transition to apixaban. No prior to admission anticoagulation. Heparin infusion increased to 1700 units/hr for low aPTT 60 seconds. CBC: Hgb remains low/stable, Plt elevated SCr 0.8  Goal of Therapy:  Monitor platelets by anticoagulation protocol: Yes   Plan:   Apixaban 10 mg PO BID x7 days, then 5 mg PO BID (starter pack ordered for discharge)  Pharmacy to provide education and 30 day discount card prior to discharge.  14/10 PharmD, BCPS Clinical Pharmacist [facility] main pharmacy [phone] 07/22/2020 9:24 AM

## 2020-07-22 NOTE — Discharge Instructions (Signed)
Information on my medicine - ELIQUIS (apixaban) Why was Eliquis prescribed for you? Eliquis was prescribed to treat blood clots that may have been found in the veins of your legs (deep vein thrombosis) or in your lungs (pulmonary embolism) and to reduce the risk of them occurring again.  What do You need to know about Eliquis ? The starting dose is 10 mg (two 5 mg tablets) taken TWICE daily for the FIRST SEVEN (7) DAYS, then on Sunday 12/19 the dose is reduced to ONE 5 mg tablet taken TWICE daily.  Eliquis may be taken with or without food.   Try to take the dose about the same time in the morning and in the evening. If you have difficulty swallowing the tablet whole please discuss with your pharmacist how to take the medication safely.  Take Eliquis exactly as prescribed and DO NOT stop taking Eliquis without talking to the doctor who prescribed the medication.  Stopping may increase your risk of developing a new blood clot.  Refill your prescription before you run out.  After discharge, you should have regular check-up appointments with your healthcare provider that is prescribing your Eliquis.    What do you do if you miss a dose? If a dose of ELIQUIS is not taken at the scheduled time, take it as soon as possible on the same day and twice-daily administration should be resumed. The dose should not be doubled to make up for a missed dose.  Important Safety Information A possible side effect of Eliquis is bleeding. You should call your healthcare provider right away if you experience any of the following: ? Bleeding from an injury or your nose that does not stop. ? Unusual colored urine (red or dark brown) or unusual colored stools (red or black). ? Unusual bruising for unknown reasons. ? A serious fall or if you hit your head (even if there is no bleeding).  Some medicines may interact with Eliquis and might increase your risk of bleeding or clotting while on Eliquis. To help  avoid this, consult your healthcare provider or pharmacist prior to using any new prescription or non-prescription medications, including herbals, vitamins, non-steroidal anti-inflammatory drugs (NSAIDs) and supplements.  This website has more information on Eliquis (apixaban): http://www.eliquis.com/eliquis/home

## 2020-07-22 NOTE — TOC Transition Note (Signed)
Transition of Care [facility]) - CM/SW Discharge Note   Patient Details  Name: Sharon Faulkner MRN: [id number] Date of Birth: Jun 13, 1976  [facility] CM/SW Contact:  Marina Goodell Phone Number:  [phone] 07/22/2020, 9:48 AM   Clinical Narrative:     Dropped off [place] 30 day card for patient.  Pt will d/c home.  TOC consult complete.         Patient Goals and CMS Choice        Discharge Placement                       Discharge Plan and Services                                     Social Determinants of Health (SDOH) Interventions     Readmission Risk Interventions No flowsheet data found.

## 2020-08-06 ENCOUNTER — Other Ambulatory Visit: Payer: Self-pay

## 2020-08-06 ENCOUNTER — Emergency Department (HOSPITAL_COMMUNITY)
Admission: EM | Admit: 2020-08-06 | Discharge: 2020-08-07 | Disposition: A | Discharge status: Home / Self Care | Payer: Self-pay | Attending: Emergency Medicine | Admitting: Emergency Medicine

## 2020-08-06 ENCOUNTER — Encounter (HOSPITAL_COMMUNITY): Payer: Self-pay | Admitting: Obstetrics and Gynecology

## 2020-08-06 DIAGNOSIS — S161XXA Strain of muscle, fascia and tendon at neck level, initial encounter: Secondary | ICD-10-CM | POA: Diagnosis not present

## 2020-08-06 DIAGNOSIS — S92415A Nondisplaced fracture of proximal phalanx of left great toe, initial encounter for closed fracture: Secondary | ICD-10-CM | POA: Insufficient documentation

## 2020-08-06 DIAGNOSIS — J45909 Unspecified asthma, uncomplicated: Secondary | ICD-10-CM | POA: Diagnosis not present

## 2020-08-06 DIAGNOSIS — Y92410 Unspecified street and highway as the place of occurrence of the external cause: Secondary | ICD-10-CM | POA: Diagnosis not present

## 2020-08-06 DIAGNOSIS — S20312A Abrasion of left front wall of thorax, initial encounter: Secondary | ICD-10-CM

## 2020-08-06 DIAGNOSIS — F17210 Nicotine dependence, cigarettes, uncomplicated: Secondary | ICD-10-CM | POA: Diagnosis not present

## 2020-08-06 DIAGNOSIS — S0990XA Unspecified injury of head, initial encounter: Secondary | ICD-10-CM | POA: Diagnosis not present

## 2020-08-06 DIAGNOSIS — Z8616 Personal history of COVID-19: Secondary | ICD-10-CM | POA: Diagnosis not present

## 2020-08-06 DIAGNOSIS — S99922A Unspecified injury of left foot, initial encounter: Secondary | ICD-10-CM | POA: Diagnosis present

## 2020-08-06 DIAGNOSIS — S20212A Contusion of left front wall of thorax, initial encounter: Secondary | ICD-10-CM | POA: Insufficient documentation

## 2020-08-06 DIAGNOSIS — S2001XA Contusion of right breast, initial encounter: Secondary | ICD-10-CM | POA: Insufficient documentation
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
## 2020-08-07 ENCOUNTER — Encounter (HOSPITAL_COMMUNITY): Payer: Self-pay

## 2020-08-07 ENCOUNTER — Emergency Department (HOSPITAL_COMMUNITY): Payer: Self-pay

## 2020-08-07 LAB — I-STAT BETA HCG BLOOD, ED (MC, WL, AP ONLY): I-stat hCG, quantitative: <5 m[IU]/mL (ref <5)

## 2020-08-07 LAB — CBC WITH DIFFERENTIAL/PLATELET
Abs Immature Granulocytes: 0.02 10*3/uL (ref 0.00–0.07)
Basophils Absolute: 0 10*3/uL (ref 0.0–0.1)
Basophils Relative: 1 %
Eosinophils Absolute: 0.7 10*3/uL — ABNORMAL HIGH (ref 0.0–0.5)
Eosinophils Relative: 8 %
HCT: 31.7 % — ABNORMAL LOW (ref 36.0–46.0)
Hemoglobin: 9.1 g/dL — ABNORMAL LOW (ref 12.0–15.0)
Immature Granulocytes: 0 %
Lymphocytes Relative: 18 %
Lymphs Abs: 1.6 10*3/uL (ref 0.7–4.0)
MCH: 20.9 pg — ABNORMAL LOW (ref 26.0–34.0)
MCHC: 28.7 g/dL — ABNORMAL LOW (ref 30.0–36.0)
MCV: 72.7 fL — ABNORMAL LOW (ref 80.0–100.0)
Monocytes Absolute: 0.8 10*3/uL (ref 0.1–1.0)
Monocytes Relative: 10 %
Neutro Abs: 5.5 10*3/uL (ref 1.7–7.7)
Neutrophils Relative %: 63 %
Platelets: 255 10*3/uL (ref 150–400)
RBC: 4.36 MIL/uL (ref 3.87–5.11)
RDW: 25.2 % — ABNORMAL HIGH (ref 11.5–15.5)
WBC: 8.7 10*3/uL (ref 4.0–10.5)
nRBC: 0 % (ref 0.0–0.2)

## 2020-08-07 LAB — COMPREHENSIVE METABOLIC PANEL
ALT: 34 U/L (ref 0–44)
AST: 24 U/L (ref 15–41)
Albumin: 3.6 g/dL (ref 3.5–5.0)
Alkaline Phosphatase: 90 U/L (ref 38–126)
Anion gap: 9 (ref 5–15)
BUN: 11 mg/dL (ref 6–20)
CO2: 25 mmol/L (ref 22–32)
Calcium: 8.9 mg/dL (ref 8.9–10.3)
Chloride: 105 mmol/L (ref 98–111)
Creatinine, Ser: 0.76 mg/dL (ref 0.44–1.00)
GFR, Estimated: >60 mL/min (ref >60)
Glucose, Bld: 112 mg/dL — ABNORMAL HIGH (ref 70–99)
Potassium: 3.7 mmol/L (ref 3.5–5.1)
Sodium: 139 mmol/L (ref 135–145)
Total Bilirubin: 0.5 mg/dL (ref 0.3–1.2)
Total Protein: 7.1 g/dL (ref 6.5–8.1)

## 2020-08-07 LAB — PROTIME-INR
INR: 1.1 (ref 0.8–1.2)
Prothrombin Time: 13.4 seconds (ref 11.4–15.2)

## 2020-08-07 LAB — ETHANOL: Alcohol, Ethyl (B): <10 mg/dL (ref <10)

## 2020-08-07 MED ORDER — SILVER SULFADIAZINE 1 % EX CREA
TOPICAL_CREAM | Freq: Once | CUTANEOUS | Status: AC
  Filled 2020-08-07: qty 50

## 2020-08-07 MED ORDER — ONDANSETRON HCL 4 MG/2ML IJ SOLN
4.0000 mg | Freq: Once | INTRAMUSCULAR | Status: AC
  Administered 2020-08-07: 01:00:00 4 mg via INTRAVENOUS
  Filled 2020-08-07: qty 2

## 2020-08-07 MED ORDER — IOHEXOL 300 MG/ML  SOLN
100.0000 mL | Freq: Once | INTRAMUSCULAR | Status: AC | PRN
  Administered 2020-08-07: 100 mL via INTRAVENOUS

## 2020-08-07 MED ORDER — METHOCARBAMOL 500 MG PO TABS: 500.0000 mg | ORAL_TABLET | Freq: Four times a day (QID) | ORAL | 0 refills | Status: DC | PRN

## 2020-08-07 MED ORDER — FENTANYL CITRATE (PF) 100 MCG/2ML IJ SOLN
50.0000 ug | Freq: Once | INTRAMUSCULAR | Status: AC
  Administered 2020-08-07: 02:00:00 50 ug via INTRAVENOUS
  Filled 2020-08-07: qty 2

## 2020-08-07 MED ORDER — IBUPROFEN 800 MG PO TABS: 800.0000 mg | ORAL_TABLET | Freq: Three times a day (TID) | ORAL | 0 refills | Status: DC | PRN

## 2020-08-07 NOTE — ED Notes (Signed)
Pt reports urinating prior to room call.
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
## 2022-04-11 IMAGING — CT CT ANGIO CHEST
2 of 7 series · 17 of 46 positions shown · IV contrast (APPLIED)
Comparison: Portable chest 07/16/2020.
COMPARISON: Portable chest 07/16/2020.

Addendum:
CLINICAL DATA: 44-year-old female positive OFK9E-LP. Abnormal
D-dimer. Shortness of breath and cough.

EXAM:
CT ANGIOGRAPHY CHEST WITH CONTRAST
TECHNIQUE: Multidetector CT imaging of the chest was performed using the
standard protocol during bolus administration of intravenous
contrast. Multiplanar CT image reconstructions and MIPs were
obtained to evaluate the vascular anatomy.
CONTRAST:  100mL OMNIPAQUE IOHEXOL 350 MG/ML SOLN

[Series 5: thins · axial · 0.73mm/px · z∈[-127,+112]mm · 15 of 275 slices shown]
[im 18/275  lung]
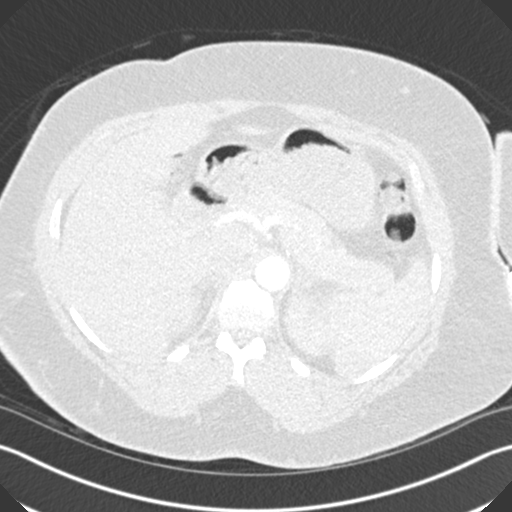
[im 35/275  soft-tissue]
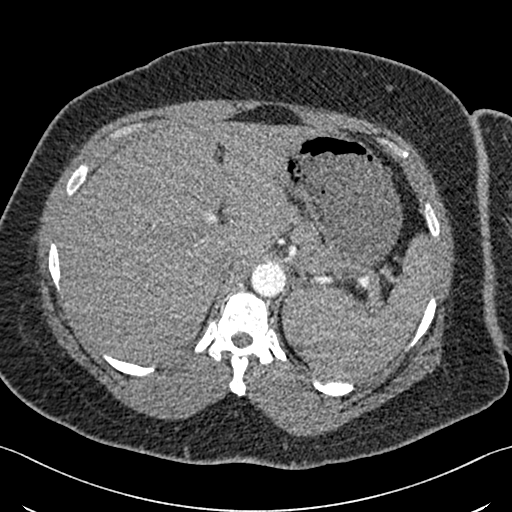
[im 52/275  lung]
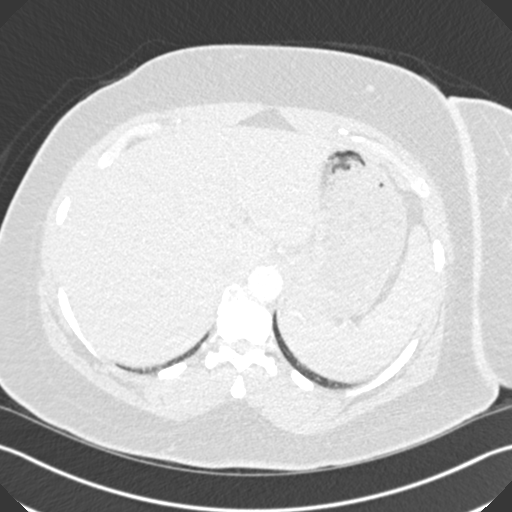
[im 69/275  soft-tissue]
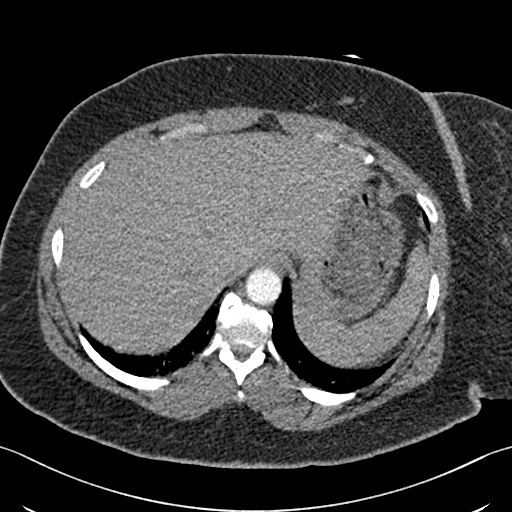
[im 86/275  lung]
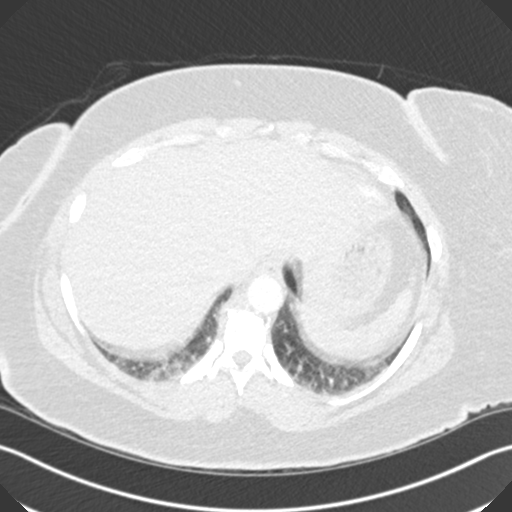
[im 103/275  soft-tissue]
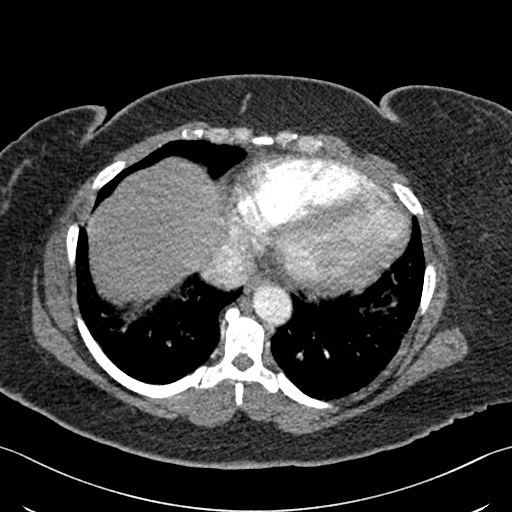
[im 120/275  lung]
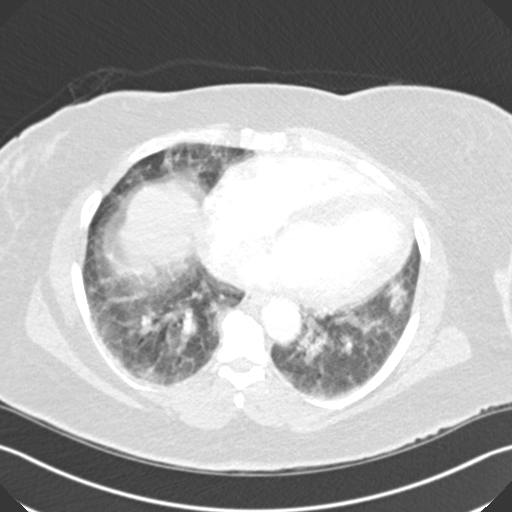
[im 138/275  soft-tissue]
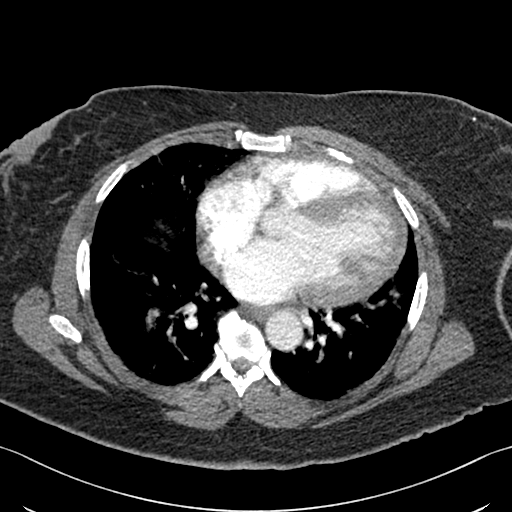
[im 155/275  lung]
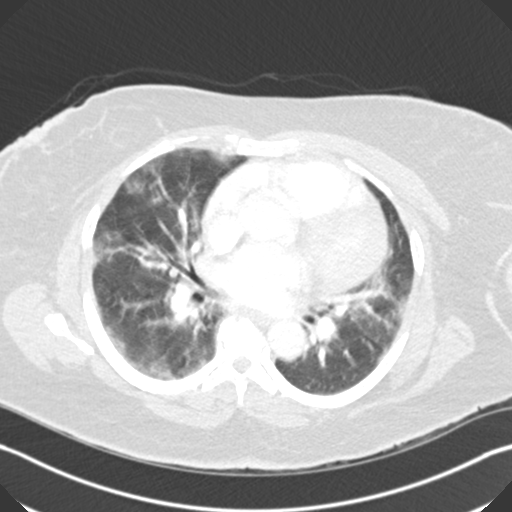
[im 172/275  soft-tissue]
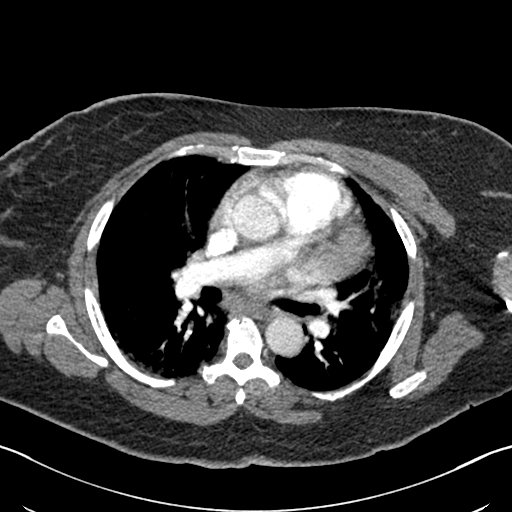
[im 189/275  lung]
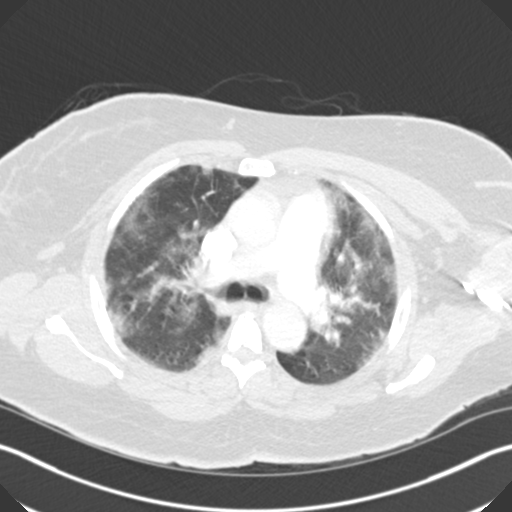
[im 206/275  soft-tissue]
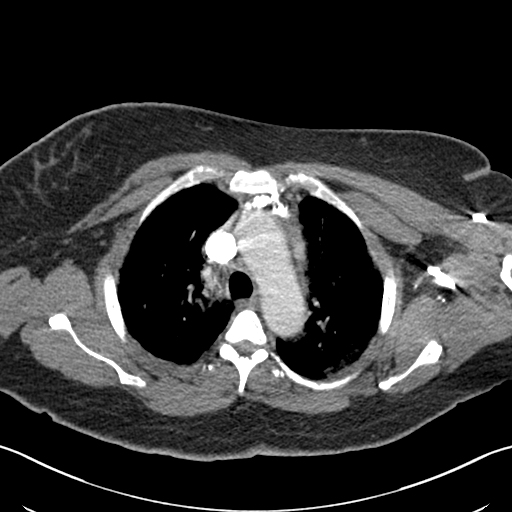
[im 223/275  lung]
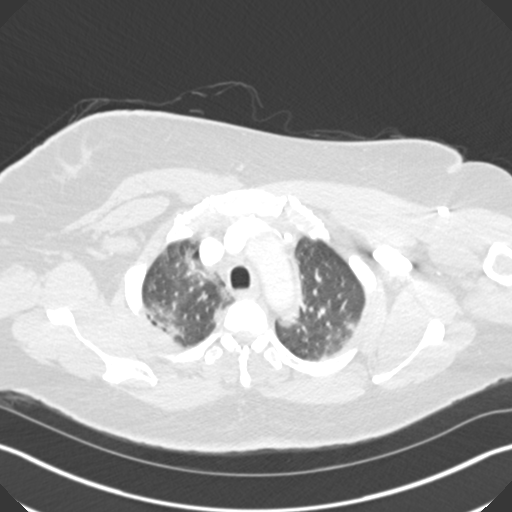
[im 240/275  soft-tissue]
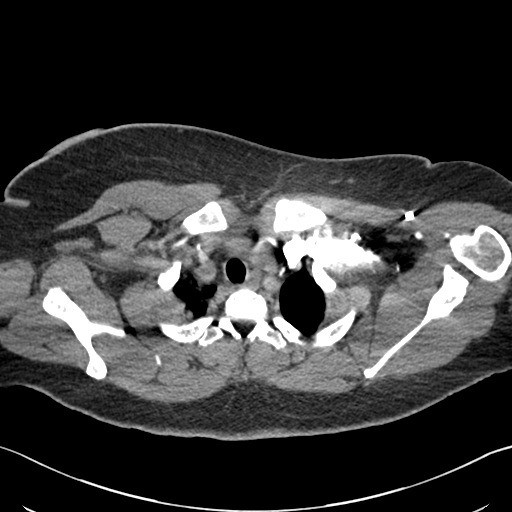
[im 257/275  lung]
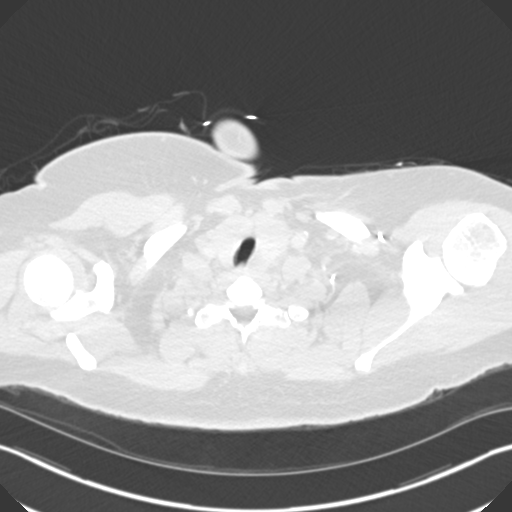

[Series 7: coronal mpr · coronal · 0.54mm/px · 2 of 95 slices shown]
[im 32/95  soft-tissue]
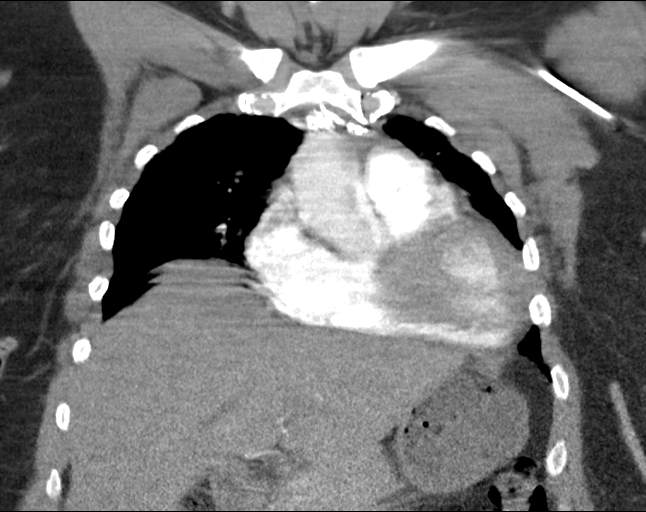
[im 63/95  soft-tissue]
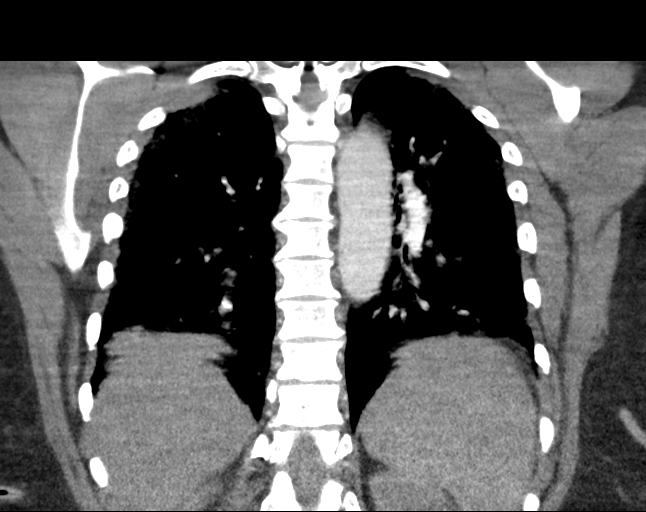

[17 of 46 positions shown; findings below may reference images not displayed]

FINDINGS: Cardiovascular: Good contrast bolus timing in the pulmonary arterial
tree. Respiratory motion. No main pulmonary artery filling defect.
However, there is loss of the normal enhancement of the proximal
right upper lobe pulmonary artery beginning just distal to the
bifurcation of the right main PA (series 7, image 48 and series 5,
image 82.

Limited detail of other lobar pulmonary arteries, with no other
definite pulmonary embolus identified.

Mild cardiomegaly (series 4, image 51). No pericardial effusion.
Negative visible aorta.

Mediastinum/Nodes: Negative.  No lymphadenopathy.

Lungs/Pleura: Respiratory motion. Major airways remain patent.
Widely scattered peripheral and to a lesser extent peribronchial
pulmonary ground-glass opacity. Costophrenic angles are least
affected. No pleural effusion.

Upper Abdomen: Negative visible noncontrast liver, spleen, pancreas,
adrenal glands, left kidney and bowel in the upper abdomen.

Musculoskeletal: No acute osseous abnormality identified.

Review of the MIP images confirms the above findings.
IMPRESSION: 1. Exam significantly degraded by motion, but appearance strongly
suggests acute right upper lobar pulmonary embolus. No saddle
embolus. No definite additional lobar thrombus.

2. Widespread bilateral OFK9E-LP pneumonia.  No pleural effusion.

3. Mild cardiomegaly.

ADDENDUM:
Critical Value/emergent results were called by telephone at the time
of interpretation on 07/20/2020 at [DATE] to provider Herlanoz Midad Fatoni,
who verbally acknowledged these results.

*** End of Addendum ***
FINDINGS: Cardiovascular: Good contrast bolus timing in the pulmonary arterial
tree. Respiratory motion. No main pulmonary artery filling defect.
However, there is loss of the normal enhancement of the proximal
right upper lobe pulmonary artery beginning just distal to the
bifurcation of the right main PA (series 7, image 48 and series 5,
image 82.

Limited detail of other lobar pulmonary arteries, with no other
definite pulmonary embolus identified.

Mild cardiomegaly (series 4, image 51). No pericardial effusion.
Negative visible aorta.

Mediastinum/Nodes: Negative.  No lymphadenopathy.

Lungs/Pleura: Respiratory motion. Major airways remain patent.
Widely scattered peripheral and to a lesser extent peribronchial
pulmonary ground-glass opacity. Costophrenic angles are least
affected. No pleural effusion.

Upper Abdomen: Negative visible noncontrast liver, spleen, pancreas,
adrenal glands, left kidney and bowel in the upper abdomen.

Musculoskeletal: No acute osseous abnormality identified.

Review of the MIP images confirms the above findings.
IMPRESSION: 1. Exam significantly degraded by motion, but appearance strongly
suggests acute right upper lobar pulmonary embolus. No saddle
embolus. No definite additional lobar thrombus.

2. Widespread bilateral OFK9E-LP pneumonia.  No pleural effusion.

3. Mild cardiomegaly.

## 2023-03-11 ENCOUNTER — Other Ambulatory Visit: Payer: Self-pay

## 2023-03-11 ENCOUNTER — Emergency Department (HOSPITAL_COMMUNITY): Payer: Medicaid Other

## 2023-03-11 ENCOUNTER — Emergency Department (HOSPITAL_COMMUNITY)
Admission: EM | Admit: 2023-03-11 | Discharge: 2023-03-11 | Disposition: A | Discharge status: Home / Self Care | Payer: Medicaid Other | Attending: Emergency Medicine | Admitting: Emergency Medicine

## 2023-03-11 ENCOUNTER — Encounter (HOSPITAL_COMMUNITY): Payer: Self-pay

## 2023-03-11 DIAGNOSIS — M25561 Pain in right knee: Secondary | ICD-10-CM

## 2023-03-11 DIAGNOSIS — D72829 Elevated white blood cell count, unspecified: Secondary | ICD-10-CM | POA: Insufficient documentation

## 2023-03-11 DIAGNOSIS — M25461 Effusion, right knee: Secondary | ICD-10-CM | POA: Insufficient documentation

## 2023-03-11 DIAGNOSIS — R000 Tachycardia, unspecified: Secondary | ICD-10-CM | POA: Diagnosis not present

## 2023-03-11 LAB — BASIC METABOLIC PANEL
Anion gap: 10 (ref 5–15)
BUN: 9 mg/dL (ref 6–20)
CO2: 24 mmol/L (ref 22–32)
Calcium: 8.9 mg/dL (ref 8.9–10.3)
Chloride: 105 mmol/L (ref 98–111)
Creatinine, Ser: 0.6 mg/dL (ref 0.44–1.00)
GFR, Estimated: >60 mL/min (ref >60)
Glucose, Bld: 125 mg/dL — ABNORMAL HIGH (ref 70–99)
Potassium: 3.5 mmol/L (ref 3.5–5.1)
Sodium: 139 mmol/L (ref 135–145)

## 2023-03-11 LAB — CBC WITH DIFFERENTIAL/PLATELET
Abs Immature Granulocytes: 0.05 10*3/uL (ref 0.00–0.07)
Basophils Absolute: 0.1 10*3/uL (ref 0.0–0.1)
Basophils Relative: 0 %
Eosinophils Absolute: 0.3 10*3/uL (ref 0.0–0.5)
Eosinophils Relative: 2 %
HCT: 33.5 % — ABNORMAL LOW (ref 36.0–46.0)
Hemoglobin: 9.5 g/dL — ABNORMAL LOW (ref 12.0–15.0)
Immature Granulocytes: 0 %
Lymphocytes Relative: 7 %
Lymphs Abs: 0.9 10*3/uL (ref 0.7–4.0)
MCH: 20.1 pg — ABNORMAL LOW (ref 26.0–34.0)
MCHC: 28.4 g/dL — ABNORMAL LOW (ref 30.0–36.0)
MCV: 70.8 fL — ABNORMAL LOW (ref 80.0–100.0)
Monocytes Absolute: 1 10*3/uL (ref 0.1–1.0)
Monocytes Relative: 7 %
Neutro Abs: 11.8 10*3/uL — ABNORMAL HIGH (ref 1.7–7.7)
Neutrophils Relative %: 84 %
Platelets: 395 10*3/uL (ref 150–400)
RBC: 4.73 MIL/uL (ref 3.87–5.11)
RDW: 18.7 % — ABNORMAL HIGH (ref 11.5–15.5)
WBC: 14.1 10*3/uL — ABNORMAL HIGH (ref 4.0–10.5)
nRBC: 0 % (ref 0.0–0.2)

## 2023-03-11 MED ORDER — KETOROLAC TROMETHAMINE 15 MG/ML IJ SOLN
60.0000 mg | Freq: Once | INTRAMUSCULAR | Status: AC
  Administered 2023-03-11: 60 mg via INTRAMUSCULAR
  Filled 2023-03-11: qty 4

## 2023-03-11 MED ORDER — LIDOCAINE-EPINEPHRINE (PF) 2 %-1:200000 IJ SOLN
10.0000 mL | Freq: Once | INTRAMUSCULAR | Status: DC
  Filled 2023-03-11: qty 20

## 2023-03-11 MED ORDER — OXYCODONE-ACETAMINOPHEN 5-325 MG PO TABS: 1.0000 | ORAL_TABLET | Freq: Four times a day (QID) | ORAL | 0 refills | Status: DC | PRN

## 2023-03-11 MED ORDER — NAPROXEN 375 MG PO TABS: 375.0000 mg | ORAL_TABLET | Freq: Two times a day (BID) | ORAL | 0 refills | Status: DC

## 2023-03-11 MED ORDER — ONDANSETRON 4 MG PO TBDP
4.0000 mg | ORAL_TABLET | Freq: Once | ORAL | Status: AC
  Administered 2023-03-11: 4 mg via ORAL
  Filled 2023-03-11: qty 1

## 2023-03-11 MED ORDER — PREDNISONE 20 MG PO TABS: 40.0000 mg | ORAL_TABLET | Freq: Every day | ORAL | 0 refills | Status: DC

## 2023-03-11 MED ORDER — OXYCODONE-ACETAMINOPHEN 5-325 MG PO TABS
1.0000 | ORAL_TABLET | Freq: Once | ORAL | Status: AC
  Administered 2023-03-11: 1 via ORAL
  Filled 2023-03-11: qty 1

## 2023-03-11 MED ORDER — PREDNISONE 20 MG PO TABS
60.0000 mg | ORAL_TABLET | Freq: Once | ORAL | Status: AC
  Administered 2023-03-11: 60 mg via ORAL
  Filled 2023-03-11: qty 3

## 2023-03-11 NOTE — ED Provider Notes (Signed)
[place] EMERGENCY DEPARTMENT AT [facility] Provider Note   CSN: [id number] Arrival date & time: 03/11/23  0230     History  Chief Complaint  Patient presents with   Leg Pain    Sharon Faulkner is a 47 y.o. female.  Pt is a 47y/o female presenting today with right knee pain that started on Monday and has been progressing.  She denies any known injury and no prior procedures done to the knee.  She has been noticing worsening swelling above the knee cap and knee is now so painful she doesn't want to move it and has been walking with a crutch.  She does report being on her feet a lot.  No fever or vomiting.  NO prior hx of DVT or PE and no anticoagulants (she did take a short course of eliquis when she had covid but no clots).  She unfortunately has waited in the waiting room for 5 hours and now is having pain in her back and hips as well but states it all started with her knee.  She has not taken anything for the pain.  The history is provided by the patient.  Leg Pain      Home Medications Prior to Admission medications   Medication Sig Start Date End Date Taking? Authorizing Provider  naproxen (NAPROSYN) 375 MG tablet Take 1 tablet (375 mg total) by mouth 2 (two) times daily. 03/11/23  Yes Gwyneth Sprout, MD  oxyCODONE-acetaminophen (PERCOCET/ROXICET) 5-325 MG tablet Take 1 tablet by mouth every 6 (six) hours as needed for severe pain. 03/11/23  Yes Ronny Korff, Alphonzo Lemmings, MD  predniSONE (DELTASONE) 20 MG tablet Take 2 tablets (40 mg total) by mouth daily. 03/11/23  Yes Daltyn Degroat, Alphonzo Lemmings, MD  albuterol (VENTOLIN HFA) 108 (90 Base) MCG/ACT inhaler Inhale 2 puffs into the lungs every 6 (six) hours as needed for wheezing or shortness of breath.  06/18/20   [provider]  dexamethasone (DECADRON) 6 MG tablet Take 1 tablet (6 mg total) by mouth daily. Patient not taking: No sig reported 07/23/20   Tyrone Nine, MD  ferrous sulfate 325 (65 FE) MG tablet Take 1 tablet (325  mg total) by mouth daily. Patient not taking: No sig reported 06/21/20   Dione Booze, MD  methocarbamol (ROBAXIN) 500 MG tablet Take 1 tablet (500 mg total) by mouth every 6 (six) hours as needed for muscle spasms. 08/07/20   Long, Arlyss Repress, MD      Allergies    Shellfish allergy, [place] nucifera, Codeine, and Mango flavor    Review of Systems   Review of Systems  Physical Exam Updated Vital Signs BP (!) 169/120 (BP Location: Right Arm)   Pulse (!) 102   Temp 98.4 F (36.9 C) (Oral)   Resp (!) 24   Ht 5\' 4"  (1.626 m)   Wt 113.4 kg   LMP 03/09/2023   SpO2 99%   BMI 42.91 kg/m  Physical Exam Vitals and nursing note reviewed.  Constitutional:      Comments: Appears uncomfortable.  HENT:     Head: Normocephalic.  Cardiovascular:     Rate and Rhythm: Tachycardia present.  Pulmonary:     Effort: Pulmonary effort is normal.  Musculoskeletal:        General: Tenderness present.     Right knee: Swelling and effusion present. Decreased range of motion. Tenderness present over the medial joint line, lateral joint line and patellar tendon.     Comments: No significant right calf pain  or thigh pain.  No right leg swelling and 2+ DP pulse in the right foot and cap refill <2 sec.  No erythema or significant warmth of the joint  Skin:    General: Skin is warm.  Neurological:     Mental Status: She is alert. Mental status is at baseline.     ED Results / Procedures / Treatments   Labs (all labs ordered are listed, but only abnormal results are displayed) Labs Reviewed  CBC WITH DIFFERENTIAL/PLATELET - Abnormal; Notable for the following components:      Result Value   WBC 14.1 (*)    Hemoglobin 9.5 (*)    HCT 33.5 (*)    MCV 70.8 (*)    MCH 20.1 (*)    MCHC 28.4 (*)    RDW 18.7 (*)    Neutro Abs 11.8 (*)    All other components within normal limits  BASIC METABOLIC PANEL - Abnormal; Notable for the following components:   Glucose, Bld 125 (*)    All other components  within normal limits    EKG None  Radiology DG Knee Complete 4 Views Right  Result Date: 03/11/2023 CLINICAL DATA:  Right knee pain. EXAM: RIGHT KNEE - COMPLETE 4+ VIEW COMPARISON:  Right knee radiograph dated 08/07/2020. FINDINGS: There is no acute fracture or dislocation. The bones are well mineralized. Mild arthritic changes. There is a moderate suprapatellar effusion. The soft tissues are unremarkable. IMPRESSION: 1. No acute fracture or dislocation. 2. Moderate suprapatellar effusion. Electronically Signed   By: Elgie Collard M.D.   On: 03/11/2023 03:54    Procedures Procedures    Medications Ordered in ED Medications  lidocaine-EPINEPHrine (XYLOCAINE W/EPI) 2 %-1:200000 (PF) injection 10 mL (has no administration in time range)  ketorolac (TORADOL) 15 MG/ML injection 60 mg (60 mg Intramuscular Given 03/11/23 0806)  oxyCODONE-acetaminophen (PERCOCET/ROXICET) 5-325 MG per tablet 1 tablet (1 tablet Oral Given 03/11/23 0809)  ondansetron (ZOFRAN-ODT) disintegrating tablet 4 mg (4 mg Oral Given 03/11/23 0810)    ED Course/ Medical Decision Making/ A&P                                 Medical Decision Making Amount and/or Complexity of Data Reviewed Labs: ordered. Decision-making details documented in ED Course. Radiology: ordered and independent interpretation performed. Decision-making details documented in ED Course.  Risk Prescription drug management.   Pt presenting today with severe right knee pain that has been progressive.  No hx of knee pain in the past.  Pt denies injury.  Concern for effusion most likely from arthritis.  Pt's knee is not erythematous or hot.  Low suspicion for septic joint, gout or reiter's disease, also doubt radiculopathy.  Pt had labs done while waiting and I independently interpreted pt's labs and CBC with leukocytosis of 14 and hb of 9.5 which is unchanged.  BMP wnl.  I have independently visualized and interpreted pt's images today.  Knee image  today shows suprapatella effusion but no fracture.  Will give pt's pain control.  10:16 AM Pt is still having signifincant pain despite meds and will do a therapeutic arthrocentesis.  Discussed with the pt and she is ok with this plan.  10:16 AM Unable to aspirate the joint.  Will apply ace wrap, oral steroids and pain control with ortho f/u.  Given return precautions.        Final Clinical Impression(s) / ED Diagnoses Final diagnoses:  Acute pain of right knee  Knee effusion, right    Rx / DC Orders ED Discharge Orders          Ordered    predniSONE (DELTASONE) 20 MG tablet  Daily        03/11/23 1014    naproxen (NAPROSYN) 375 MG tablet  2 times daily        03/11/23 1014    oxyCODONE-acetaminophen (PERCOCET/ROXICET) 5-325 MG tablet  Every 6 hours PRN        03/11/23 1014              Gwyneth Sprout, MD 03/11/23 1017

## 2023-03-11 NOTE — Discharge Instructions (Addendum)
Start the steroids to help with the pain and swelling, stay off your feet and elevate and ice the knee.  If you start running fever, the knee gets red or the whole legs starts swelling return to the ER.  Take the naproxen for pain and inflammation but also you can use the pain medication as needed.

## 2023-03-11 NOTE — ED Triage Notes (Signed)
Arrives [facility] from home with c/o right leg pain worst around the right knee but extends both directions.   Pain began ~Monday and has progressed since then. Subjective warmth and swelling on RLE.

## 2023-04-08 ENCOUNTER — Encounter (HOSPITAL_BASED_OUTPATIENT_CLINIC_OR_DEPARTMENT_OTHER): Payer: Self-pay | Admitting: *Deleted

## 2023-04-08 ENCOUNTER — Emergency Department (HOSPITAL_BASED_OUTPATIENT_CLINIC_OR_DEPARTMENT_OTHER): Payer: Medicaid Other

## 2023-04-08 ENCOUNTER — Inpatient Hospital Stay (HOSPITAL_BASED_OUTPATIENT_CLINIC_OR_DEPARTMENT_OTHER)
Admission: EM | Admit: 2023-04-08 | Discharge: 2023-04-19 | DRG: 470 | Disposition: A | Discharge status: Home Health | Payer: Medicaid Other | Attending: Family Medicine | Admitting: Family Medicine

## 2023-04-08 ENCOUNTER — Other Ambulatory Visit: Payer: Self-pay

## 2023-04-08 DIAGNOSIS — D75839 Thrombocytosis, unspecified: Secondary | ICD-10-CM | POA: Diagnosis not present

## 2023-04-08 DIAGNOSIS — D649 Anemia, unspecified: Secondary | ICD-10-CM

## 2023-04-08 DIAGNOSIS — E8809 Other disorders of plasma-protein metabolism, not elsewhere classified: Secondary | ICD-10-CM | POA: Diagnosis present

## 2023-04-08 DIAGNOSIS — D62 Acute posthemorrhagic anemia: Secondary | ICD-10-CM | POA: Diagnosis not present

## 2023-04-08 DIAGNOSIS — Z7952 Long term (current) use of systemic steroids: Secondary | ICD-10-CM

## 2023-04-08 DIAGNOSIS — Z713 Dietary counseling and surveillance: Secondary | ICD-10-CM

## 2023-04-08 DIAGNOSIS — A491 Streptococcal infection, unspecified site: Secondary | ICD-10-CM

## 2023-04-08 DIAGNOSIS — R400 Somnolence: Secondary | ICD-10-CM | POA: Diagnosis not present

## 2023-04-08 DIAGNOSIS — M25461 Effusion, right knee: Secondary | ICD-10-CM

## 2023-04-08 DIAGNOSIS — Z86711 Personal history of pulmonary embolism: Secondary | ICD-10-CM

## 2023-04-08 DIAGNOSIS — J45909 Unspecified asthma, uncomplicated: Secondary | ICD-10-CM | POA: Diagnosis present

## 2023-04-08 DIAGNOSIS — D509 Iron deficiency anemia, unspecified: Secondary | ICD-10-CM | POA: Diagnosis present

## 2023-04-08 DIAGNOSIS — Z885 Allergy status to narcotic agent status: Secondary | ICD-10-CM

## 2023-04-08 DIAGNOSIS — Z9102 Food additives allergy status: Secondary | ICD-10-CM

## 2023-04-08 DIAGNOSIS — Z6841 Body Mass Index (BMI) 40.0 and over, adult: Secondary | ICD-10-CM

## 2023-04-08 DIAGNOSIS — M86159 Other acute osteomyelitis, unspecified femur: Secondary | ICD-10-CM

## 2023-04-08 DIAGNOSIS — M8618 Other acute osteomyelitis, other site: Secondary | ICD-10-CM | POA: Diagnosis present

## 2023-04-08 DIAGNOSIS — M25561 Pain in right knee: Principal | ICD-10-CM

## 2023-04-08 DIAGNOSIS — Z91013 Allergy to seafood: Secondary | ICD-10-CM

## 2023-04-08 DIAGNOSIS — M00261 Other streptococcal arthritis, right knee: Principal | ICD-10-CM | POA: Diagnosis present

## 2023-04-08 DIAGNOSIS — M009 Pyogenic arthritis, unspecified: Secondary | ICD-10-CM

## 2023-04-08 DIAGNOSIS — Z79899 Other long term (current) drug therapy: Secondary | ICD-10-CM

## 2023-04-08 DIAGNOSIS — I10 Essential (primary) hypertension: Secondary | ICD-10-CM | POA: Diagnosis present

## 2023-04-08 DIAGNOSIS — F17210 Nicotine dependence, cigarettes, uncomplicated: Secondary | ICD-10-CM | POA: Diagnosis present

## 2023-04-08 LAB — URIC ACID: Uric Acid, Serum: 5.3 mg/dL (ref 2.5–7.1)

## 2023-04-08 LAB — CBC WITH DIFFERENTIAL/PLATELET
Abs Immature Granulocytes: 0.02 10*3/uL (ref 0.00–0.07)
Basophils Absolute: 0 10*3/uL (ref 0.0–0.1)
Basophils Relative: 0 %
Eosinophils Absolute: 0.5 10*3/uL (ref 0.0–0.5)
Eosinophils Relative: 6 %
HCT: 31.1 % — ABNORMAL LOW (ref 36.0–46.0)
Hemoglobin: 8.9 g/dL — ABNORMAL LOW (ref 12.0–15.0)
Immature Granulocytes: 0 %
Lymphocytes Relative: 21 %
Lymphs Abs: 1.6 10*3/uL (ref 0.7–4.0)
MCH: 19.2 pg — ABNORMAL LOW (ref 26.0–34.0)
MCHC: 28.6 g/dL — ABNORMAL LOW (ref 30.0–36.0)
MCV: 67 fL — ABNORMAL LOW (ref 80.0–100.0)
Monocytes Absolute: 0.7 10*3/uL (ref 0.1–1.0)
Monocytes Relative: 9 %
Neutro Abs: 4.9 10*3/uL (ref 1.7–7.7)
Neutrophils Relative %: 64 %
Platelets: 677 10*3/uL — ABNORMAL HIGH (ref 150–400)
RBC: 4.64 MIL/uL (ref 3.87–5.11)
RDW: 19.1 % — ABNORMAL HIGH (ref 11.5–15.5)
WBC: 7.6 10*3/uL (ref 4.0–10.5)
nRBC: 0 % (ref 0.0–0.2)

## 2023-04-08 LAB — BASIC METABOLIC PANEL
Anion gap: 9 (ref 5–15)
BUN: 16 mg/dL (ref 6–20)
CO2: 25 mmol/L (ref 22–32)
Calcium: 9 mg/dL (ref 8.9–10.3)
Chloride: 105 mmol/L (ref 98–111)
Creatinine, Ser: 0.67 mg/dL (ref 0.44–1.00)
GFR, Estimated: >60 mL/min (ref >60)
Glucose, Bld: 91 mg/dL (ref 70–99)
Potassium: 4 mmol/L (ref 3.5–5.1)
Sodium: 139 mmol/L (ref 135–145)

## 2023-04-08 LAB — SYNOVIAL CELL COUNT + DIFF, W/ CRYSTALS
Crystals, Fluid: NONE SEEN
Lymphocytes-Synovial Fld: 1 % (ref 0–20)
Monocyte-Macrophage-Synovial Fluid: 3 % — ABNORMAL LOW (ref 50–90)
Neutrophil, Synovial: 96 % — ABNORMAL HIGH (ref 0–25)
WBC, Synovial: UNDETERMINED /mm3 (ref 0–200)

## 2023-04-08 LAB — SEDIMENTATION RATE: Sed Rate: 100 mm/hr — ABNORMAL HIGH (ref 0–22)

## 2023-04-08 MED ORDER — CEFAZOLIN SODIUM-DEXTROSE 2-4 GM/100ML-% IV SOLN
2.0000 g | Freq: Once | INTRAVENOUS | Status: AC
  Administered 2023-04-08: 2 g via INTRAVENOUS
  Filled 2023-04-08: qty 100

## 2023-04-08 MED ORDER — HYDROMORPHONE HCL 1 MG/ML IJ SOLN
1.0000 mg | INTRAMUSCULAR | Status: DC | PRN
  Administered 2023-04-08 (×2): 1 mg via INTRAVENOUS
  Filled 2023-04-08 (×2): qty 1

## 2023-04-08 MED ORDER — PROPOFOL 10 MG/ML IV BOLUS
INTRAVENOUS | Status: AC | PRN
  Administered 2023-04-08 (×2): 20 mg via INTRAVENOUS
  Administered 2023-04-08 (×2): 50 mg via INTRAVENOUS
  Administered 2023-04-08: 30 mg via INTRAVENOUS

## 2023-04-08 MED ORDER — HYDROMORPHONE HCL 1 MG/ML IJ SOLN
1.0000 mg | Freq: Once | INTRAMUSCULAR | Status: AC
  Administered 2023-04-08: 1 mg via INTRAVENOUS
  Filled 2023-04-08: qty 1

## 2023-04-08 MED ORDER — FENTANYL CITRATE PF 50 MCG/ML IJ SOSY
50.0000 ug | PREFILLED_SYRINGE | Freq: Once | INTRAMUSCULAR | Status: AC
  Administered 2023-04-08: 50 ug via INTRAVENOUS
  Filled 2023-04-08: qty 1

## 2023-04-08 MED ORDER — OXYCODONE HCL 5 MG PO TABS: 5.0000 mg | ORAL_TABLET | ORAL | Status: DC

## 2023-04-08 MED ORDER — LIDOCAINE-EPINEPHRINE (PF) 2 %-1:200000 IJ SOLN
20.0000 mL | Freq: Once | INTRAMUSCULAR | Status: AC
  Administered 2023-04-08: 20 mL via INTRADERMAL
  Filled 2023-04-08: qty 20

## 2023-04-08 MED ORDER — PROPOFOL 10 MG/ML IV BOLUS
200.0000 mg | Freq: Once | INTRAVENOUS | Status: DC
  Filled 2023-04-08: qty 20

## 2023-04-08 MED ORDER — DIAZEPAM 5 MG/ML IJ SOLN: 5.0000 mg | Freq: Once | INTRAMUSCULAR | Status: DC

## 2023-04-08 MED ORDER — DIAZEPAM 5 MG PO TABS
5.0000 mg | ORAL_TABLET | ORAL | Status: AC
  Administered 2023-04-08: 5 mg via ORAL
  Filled 2023-04-08: qty 1

## 2023-04-08 NOTE — ED Provider Notes (Addendum)
Physical Exam  BP (!) 138/90   Pulse 80   Temp 98.3 F (36.8 C) (Oral)   Resp 16   Wt 113.4 kg   LMP 03/09/2023 (Approximate)   SpO2 99%   BMI 42.91 kg/m   Physical Exam  Procedures  .Sedation  Date/Time: 04/08/2023 7:34 PM  Performed by: Charlynne Pander, MD Authorized by: Charlynne Pander, MD   Consent:    Consent obtained:  Written Universal protocol:    Procedure explained and questions answered to patient or proxy's satisfaction: yes     Relevant documents present and verified: yes     Test results available: yes     Immediately prior to procedure, a time out was called: yes     Patient identity confirmed:  Anonymous protocol, patient vented/unresponsive Pre-sedation assessment:    Time since last food or drink:  6 hours   ASA classification: class 1 - normal, healthy patient     Mallampati score:  I - soft palate, uvula, fauces, pillars visible   Pre-sedation assessments completed and reviewed: cardiovascular function   Immediate pre-procedure details:    Reassessment: Patient reassessed immediately prior to procedure     Reviewed: vital signs   Procedure details (see MAR for exact dosages):    Preoxygenation:  Nonrebreather mask   Sedation:  Propofol   Intended level of sedation: deep   Intra-procedure monitoring:  Blood pressure monitoring   Intra-procedure events: none     Total Provider sedation time (minutes):  30 Post-procedure details:    Attendance: Constant attendance by certified staff until patient recovered     Recovery: Patient returned to pre-procedure baseline     Post-sedation assessments completed and reviewed: respiratory function     Procedure completion:  Tolerated well, no immediate complications .Joint Aspiration/Arthrocentesis  Date/Time: 04/08/2023 7:36 PM  Performed by: Charlynne Pander, MD Authorized by: Charlynne Pander, MD   Consent:    Consent obtained:  Written   Consent given by:  Patient   Risks, benefits, and  alternatives were discussed: yes     Risks discussed:  Infection Universal protocol:    Procedure explained and questions answered to patient or proxy's satisfaction: yes     Relevant documents present and verified: yes     Test results available: yes     Patient identity confirmed:  Verbally with patient Location:    Location:  Knee   Knee:  R knee Anesthesia:    Anesthesia method:  Local infiltration   Local anesthetic:  Lidocaine 2% WITH epi Procedure details:    Preparation: Patient was prepped and draped in usual sterile fashion     Needle gauge:  18 G   Ultrasound guidance: yes     Approach:  Medial   Aspirate amount:  3 cc   Aspirate characteristics:  Purulent   Steroid injected: no     Specimen collected: yes   Post-procedure details:    Dressing:  Gauze roll   Procedure completion:  Tolerated Comments:     I tried multiple times under sedation and also with ultrasound guidance.  I was able to insert the needle into the joint.  However there was thick pus and I was only able to get 1 to 2 cc out with the 18-gauge spinal needle.  I eventually tried a 14-gauge and 16-gauge Angiocath and then a 10-gauge decompression needle to be finally able to get another 2 cc out.  I was able to send it for analysis.  I wrapped the knee with compression dressing.   ED Course / MDM   Clinical Course as of 04/08/23 1933  Wed Apr 08, 2023  1519 Signed out to Dr Silverio Lay [RP]    Clinical Course User Index [RP] Rondel Baton, MD   Medical Decision Making Care assumed at 3 PM.  Patient is here with severe right knee pain.  Patient was seen about a month ago and arthrocentesis was attempted but not successful.  Patient was never followed up with orthopedic doctor since her mom has poor health and she did not have a chance to call.  Patient is here with concern for possible septic knee versus gout.  Signout pending labs and ultrasound and x-rays  7:38 PM I reviewed patient's x-ray and  independently interpreted imaging studies.  X-rays show possible septic arthritis.  Ultrasound showed no DVT but patient does have a fluid collection around the knee.  I was able to perform bedside ultrasound and showed a large effusion.  Patient states that she had a bad experience with arthrocentesis during the last ED visit.  She requested conscious sedation for the procedure.  I was able to perform conscious sedation with propofol.  Unfortunately even with sedation, it was very difficult to aspirate the joint.  I was able to perform under ultrasound guidance and the patient has very thick synovial fluid that has purulent.  I initially tried a 18-gauge spinal needle and then had to progress to a 14 and eventually a 10-gauge decompression needle to finally get out about 3 cc of purulent drainage from the joint.  I discussed case with Dr. Eulah Pont from orthopedic surgery.  He called the operating room and plan to operate on the patient tomorrow for a washout.  He wants to hold off on antibiotics for now and get a MRI with and without contrast of the knee and admission to the hospitalist service.   11:26 PM Lab called about the results of arthrocentesis.  Patient has gram-positive rods in pairs and chains and abundant WBC.  I called Dr. Eulah Pont and message him but unable to get any response.  Given high suspicion for septic arthritis and septic knee, I have ordered 2 g of Ancef.  CRITICAL CARE Performed by: Richardean Canal   Total critical care time: 37 minutes  Critical care time was exclusive of separately billable procedures and treating other patients.  Critical care was necessary to treat or prevent imminent or life-threatening deterioration.  Critical care was time spent personally by me on the following activities: development of treatment plan with patient and/or surrogate as well as nursing, discussions with consultants, evaluation of patient's response to treatment, examination of patient,  obtaining history from patient or surrogate, ordering and performing treatments and interventions, ordering and review of laboratory studies, ordering and review of radiographic studies, pulse oximetry and re-evaluation of patient's condition.   Problems Addressed: Acute pain of right knee: acute illness or injury Effusion of right knee: acute illness or injury Pyogenic arthritis of right knee joint, due to unspecified organism [facility]): acute illness or injury  Amount and/or Complexity of Data Reviewed Labs: ordered. Decision-making details documented in ED Course. Radiology: ordered and independent interpretation performed. Decision-making details documented in ED Course.  Risk Prescription drug management. Decision regarding hospitalization.          Charlynne Pander, MD 04/08/23 1944    Charlynne Pander, MD 04/08/23 2328    Charlynne Pander, MD 04/09/23 [id number]

## 2023-04-08 NOTE — Sedation Documentation (Signed)
Dr. Silverio Lay at [facility] with [place] for arthrocentesis. Lidocaine injected to medial R suprapatellar knee.

## 2023-04-08 NOTE — Progress Notes (Signed)
Patient placed on ETCO2 monitor for elective sedation. AMBU bag at bedside and suction setup.

## 2023-04-08 NOTE — ED Triage Notes (Signed)
BIB/ dropped off by sister, here from home for R sided leg pain, pinpoints to lateral R knee, radiates down to foot, and up to thigh, hip and buttocks. Onset last month. Seen at [facility] ED for the same. No relief with naproxen, aleve and tylenol PM. Denies sx other than burning pain. LMP 7/28. 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 Sitting in w/c, uncomfortable with movement.

## 2023-04-08 NOTE — Sedation Documentation (Signed)
Procedure complete. Limited success. Scant return. Sample synovial fluid sent to lab. Sterile gauze and coban to R knee. Pt back to baseline, alert, NAD, calm, coherent.

## 2023-04-08 NOTE — ED Notes (Signed)
[place] at [facility]. Pt tolerating fairly well. Alert, NAD, calm, interactive.

## 2023-04-08 NOTE — Progress Notes (Addendum)
Plan of Care Note for accepted transfer   Patient: Sharon Faulkner MRN: [id number]   DOA: 04/08/2023  Facility requesting transfer: [facility] Requesting Provider: Dr. Silverio Lay Reason for transfer: Septic arthritis Facility course: 47 yo F, seen about 1 month ago for knee swelling.  Dr. Anitra Lauth tried to do arthrocentesis at that time.  ESR elevated.  X ray today worrisome for septic arthitis.  Conscious sedation by EDP today.  2CC only, purulent.  [facility] OR planned tomorrow.  Per ortho: Hold ABx, get MRI, NPO after MN.  And on schedule for [facility] OR tomorrow at 1505 (Dr. Eulah Pont).  Addendum per EDP: "Arthrocentesis fluid came back and showed Gram positive cocci in chains and pairs and abundant WBC. I tried to contact Dr. Eulah Pont but he did not respond. I told him that I ordered 2 g of Ancef given high suspicion for septic arthritis"  Plan of care: The patient is accepted for admission to Med-surg  unit, at [facility]..   [facility] will assume care on arrival to accepting facility. Until arrival, care as per EDP. However, [facility] available 24/7 for questions and assistance.   Author: Hillary Bow., DO 04/08/2023  Check [email or web address] for on-call coverage.  Nursing staff, Please call [facility] Admits & Consults System-Wide number on Amion as soon as patient's arrival, so appropriate admitting provider can evaluate the pt.

## 2023-04-08 NOTE — ED Provider Notes (Signed)
[place] [facility] Provider Note   CSN: [id number] Arrival date & time: 04/08/23  1334     History  Chief Complaint  Patient presents with   Leg Pain    Sharon Faulkner is a 47 y.o. female.  47 year old female with a history of PE and asthma who presents to the emergency department with right-sided pain.  States that for the last month she has had right sided lower extremity pain that radiates from her hip up and down her leg.  Says that the leg is swollen as well.  Does not remember any injuries.  Has been trying naproxen, Aleve, and Tylenol PM.  Did have visit to the emergency department on 03/11/2023 where she had an x-ray that did not show any fractures but did show an effusion.  Was sent home with Percocet.  Reports that the pain is persisted so she decided to come into the emergency department today.  Says that they did attempt to draw fluid from her knee at her last emergency department visit but were unable to.       Home Medications Prior to Admission medications   Medication Sig Start Date End Date Taking? Authorizing Provider  albuterol (VENTOLIN HFA) 108 (90 Base) MCG/ACT inhaler Inhale 2 puffs into the lungs every 6 (six) hours as needed for wheezing or shortness of breath.  06/18/20   [provider]  dexamethasone (DECADRON) 6 MG tablet Take 1 tablet (6 mg total) by mouth daily. Patient not taking: No sig reported 07/23/20   Tyrone Nine, MD  ferrous sulfate 325 (65 FE) MG tablet Take 1 tablet (325 mg total) by mouth daily. Patient not taking: No sig reported 06/21/20   Dione Booze, MD  methocarbamol (ROBAXIN) 500 MG tablet Take 1 tablet (500 mg total) by mouth every 6 (six) hours as needed for muscle spasms. 08/07/20   Long, Arlyss Repress, MD  naproxen (NAPROSYN) 375 MG tablet Take 1 tablet (375 mg total) by mouth 2 (two) times daily. 03/11/23   Gwyneth Sprout, MD  oxyCODONE-acetaminophen (PERCOCET/ROXICET) 5-325 MG tablet  Take 1 tablet by mouth every 6 (six) hours as needed for severe pain. 03/11/23   Gwyneth Sprout, MD  predniSONE (DELTASONE) 20 MG tablet Take 2 tablets (40 mg total) by mouth daily. 03/11/23   Gwyneth Sprout, MD      Allergies    Shellfish allergy, Cocos nucifera, Codeine, and Mango flavor    Review of Systems   Review of Systems  Physical Exam Updated Vital Signs BP (!) 138/106   Pulse (!) 102   Temp 98.2 F (36.8 C) (Oral)   Resp 20   Wt 113.4 kg   LMP 03/09/2023 (Approximate)   SpO2 99%   BMI 42.91 kg/m  Physical Exam Vitals and nursing note reviewed.  Constitutional:      Appearance: She is well-developed. She is not toxic-appearing.     Comments: Patient nontoxic appearing but in significant pain with movement.  HENT:     Head: Normocephalic and atraumatic.     Right Ear: External ear normal.     Left Ear: External ear normal.     Nose: Nose normal.  Eyes:     Extraocular Movements: Extraocular movements intact.     Conjunctiva/sclera: Conjunctivae normal.     Pupils: Pupils are equal, round, and reactive to light.  Cardiovascular:     Comments: DP pulse 2+ on the left.  Significant edema of the right side and DP  pulse was not palpable but was dopplerable. Pulmonary:     Effort: Pulmonary effort is normal. No respiratory distress.  Musculoskeletal:     Cervical back: Normal range of motion and neck supple.     Right lower leg: No edema.     Left lower leg: No edema.     Comments: Significant effusion of right knee.  No significant warmth or erythema.  Patient refusing to range right knee due to significant pain.  3+ edema of the right lower extremity.  1+ edema of the left lower extremity.  Skin:    General: Skin is warm and dry.  Neurological:     Mental Status: She is alert and oriented to person, place, and time. Mental status is at baseline.  Psychiatric:        Mood and Affect: Mood normal.     ED Results / Procedures / Treatments   Labs (all labs  ordered are listed, but only abnormal results are displayed) Labs Reviewed  CBC WITH DIFFERENTIAL/PLATELET - Abnormal; Notable for the following components:      Result Value   Hemoglobin 8.9 (*)    HCT 31.1 (*)    MCV 67.0 (*)    MCH 19.2 (*)    MCHC 28.6 (*)    RDW 19.1 (*)    Platelets 677 (*)    All other components within normal limits  BASIC METABOLIC PANEL  URIC ACID  SEDIMENTATION RATE  C-REACTIVE PROTEIN    EKG None  Radiology No results found.  Procedures Procedures   EMERGENCY DEPARTMENT [place] MUSCULOSKELETAL INTERPRETATION "Study: Limited MSK Ultrasound"  INDICATIONS:  Right knee effusion and pain Multiple views of the body part were obtained in real-time with a multi-frequency linear probe  PERFORMED BY: Myself IMAGES ARCHIVED?: No SIDE:Right  BODY PART: Knee INTERPRETATION: Right knee effusion present    Medications Ordered in ED Medications  diazepam (VALIUM) tablet 5 mg (5 mg Oral Given 04/08/23 1434)  fentaNYL (SUBLIMAZE) injection 50 mcg (50 mcg Intravenous Given 04/08/23 1521)  lidocaine-EPINEPHrine (XYLOCAINE W/EPI) 2 %-1:200000 (PF) injection 20 mL (20 mLs Intradermal Given 04/08/23 1522)    ED Course/ Medical Decision Making/ A&P Clinical Course as of 04/08/23 1619  Wed Apr 08, 2023  1519 Signed out to Dr Silverio Lay [RP]    Clinical Course User Index [RP] Rondel Baton, MD                                 Medical Decision Making Amount and/or Complexity of Data Reviewed Labs: ordered. Radiology: ordered.  Risk Prescription drug management.   Sharon Faulkner is a 47 y.o. female with comorbidities that complicate the patient evaluation including asthma and PE not on anticoagulation who presents to the emergency department with right knee pain and swelling  Initial Ddx:  Septic joint, inflammatory arthritis, fracture, DVT, lumbar radiculopathy  MDM/Course:  Patient presents emergency department with right knee pain that goes up and down  her leg.  No trauma to her knee that she recalls.  Does appear to be swollen on exam.  Had already been seen in the emergency department and they were unable to perform a joint aspiration.  Unfortunately it appears that her pain has persisted.  No fevers.  On exam does have a swollen joints with severely limited range of motion.  Point-of-care ultrasound read demonstrated an effusion that was present before.  Was having a significant amount of pain  so requiring some Valium for anxiety as well as an IV and fentanyl for additional pain management.  Upon re-evaluation was feeling better after the Valium and was able to tolerate a repeat exam.  Labs were sent and she was signed out to Dr. Silverio Lay awaiting lab work as well as ultrasound of her leg and x-ray of her knee.  He will attempt joint aspiration for the patient.  This patient presents to the ED for concern of complaints listed in HPI, this involves an extensive number of treatment options, and is a complaint that carries with it a high risk of complications and morbidity. Disposition including potential need for admission considered.   Dispo: Pending remainder of workup  Records reviewed Outpatient Clinic Notes   Final Clinical Impression(s) / ED Diagnoses Final diagnoses:  Acute pain of right knee  Effusion of right knee    Rx / DC Orders ED Discharge Orders     None         Rondel Baton, MD 04/08/23 [phone]

## 2023-04-09 ENCOUNTER — Inpatient Hospital Stay (HOSPITAL_COMMUNITY): Payer: Medicaid Other | Admitting: Certified Registered Nurse Anesthetist

## 2023-04-09 ENCOUNTER — Encounter (HOSPITAL_COMMUNITY): Payer: Self-pay | Admitting: Internal Medicine

## 2023-04-09 ENCOUNTER — Observation Stay (HOSPITAL_COMMUNITY): Payer: Medicaid Other

## 2023-04-09 ENCOUNTER — Encounter (HOSPITAL_COMMUNITY)
Admission: EM | Disposition: A | Discharge status: Home Health | Payer: Self-pay | Source: Home / Self Care | Attending: Internal Medicine

## 2023-04-09 DIAGNOSIS — M00861 Arthritis due to other bacteria, right knee: Secondary | ICD-10-CM | POA: Diagnosis not present

## 2023-04-09 DIAGNOSIS — M00261 Other streptococcal arthritis, right knee: Secondary | ICD-10-CM | POA: Diagnosis present

## 2023-04-09 DIAGNOSIS — Z79899 Other long term (current) drug therapy: Secondary | ICD-10-CM | POA: Diagnosis not present

## 2023-04-09 DIAGNOSIS — Z91013 Allergy to seafood: Secondary | ICD-10-CM | POA: Diagnosis not present

## 2023-04-09 DIAGNOSIS — Z87891 Personal history of nicotine dependence: Secondary | ICD-10-CM | POA: Diagnosis not present

## 2023-04-09 DIAGNOSIS — Z885 Allergy status to narcotic agent status: Secondary | ICD-10-CM | POA: Diagnosis not present

## 2023-04-09 DIAGNOSIS — D62 Acute posthemorrhagic anemia: Secondary | ICD-10-CM | POA: Diagnosis not present

## 2023-04-09 DIAGNOSIS — Z9102 Food additives allergy status: Secondary | ICD-10-CM | POA: Diagnosis not present

## 2023-04-09 DIAGNOSIS — M00061 Staphylococcal arthritis, right knee: Secondary | ICD-10-CM | POA: Diagnosis not present

## 2023-04-09 DIAGNOSIS — Z6841 Body Mass Index (BMI) 40.0 and over, adult: Secondary | ICD-10-CM | POA: Diagnosis not present

## 2023-04-09 DIAGNOSIS — E8809 Other disorders of plasma-protein metabolism, not elsewhere classified: Secondary | ICD-10-CM | POA: Diagnosis present

## 2023-04-09 DIAGNOSIS — D649 Anemia, unspecified: Secondary | ICD-10-CM | POA: Diagnosis not present

## 2023-04-09 DIAGNOSIS — J45909 Unspecified asthma, uncomplicated: Secondary | ICD-10-CM

## 2023-04-09 DIAGNOSIS — M009 Pyogenic arthritis, unspecified: Secondary | ICD-10-CM

## 2023-04-09 DIAGNOSIS — Z7952 Long term (current) use of systemic steroids: Secondary | ICD-10-CM | POA: Diagnosis not present

## 2023-04-09 DIAGNOSIS — F17210 Nicotine dependence, cigarettes, uncomplicated: Secondary | ICD-10-CM

## 2023-04-09 DIAGNOSIS — M86159 Other acute osteomyelitis, unspecified femur: Secondary | ICD-10-CM | POA: Diagnosis not present

## 2023-04-09 DIAGNOSIS — R400 Somnolence: Secondary | ICD-10-CM | POA: Diagnosis not present

## 2023-04-09 DIAGNOSIS — A491 Streptococcal infection, unspecified site: Secondary | ICD-10-CM | POA: Diagnosis not present

## 2023-04-09 DIAGNOSIS — M8618 Other acute osteomyelitis, other site: Secondary | ICD-10-CM | POA: Diagnosis present

## 2023-04-09 DIAGNOSIS — D75839 Thrombocytosis, unspecified: Secondary | ICD-10-CM | POA: Diagnosis not present

## 2023-04-09 DIAGNOSIS — Z86711 Personal history of pulmonary embolism: Secondary | ICD-10-CM | POA: Diagnosis not present

## 2023-04-09 DIAGNOSIS — M1711 Unilateral primary osteoarthritis, right knee: Secondary | ICD-10-CM | POA: Diagnosis not present

## 2023-04-09 DIAGNOSIS — Z713 Dietary counseling and surveillance: Secondary | ICD-10-CM | POA: Diagnosis not present

## 2023-04-09 DIAGNOSIS — I10 Essential (primary) hypertension: Secondary | ICD-10-CM | POA: Diagnosis present

## 2023-04-09 DIAGNOSIS — D509 Iron deficiency anemia, unspecified: Secondary | ICD-10-CM | POA: Diagnosis present

## 2023-04-09 HISTORY — PX: KNEE ARTHROSCOPY: SHX127

## 2023-04-09 LAB — COMPREHENSIVE METABOLIC PANEL
ALT: 10 U/L (ref 0–44)
AST: 13 U/L — ABNORMAL LOW (ref 15–41)
Albumin: 2.8 g/dL — ABNORMAL LOW (ref 3.5–5.0)
Alkaline Phosphatase: 65 U/L (ref 38–126)
Anion gap: 9 (ref 5–15)
BUN: 12 mg/dL (ref 6–20)
CO2: 24 mmol/L (ref 22–32)
Calcium: 8.8 mg/dL — ABNORMAL LOW (ref 8.9–10.3)
Chloride: 106 mmol/L (ref 98–111)
Creatinine, Ser: 0.75 mg/dL (ref 0.44–1.00)
GFR, Estimated: >60 mL/min (ref >60)
Glucose, Bld: 94 mg/dL (ref 70–99)
Potassium: 3.9 mmol/L (ref 3.5–5.1)
Sodium: 139 mmol/L (ref 135–145)
Total Bilirubin: 0.3 mg/dL (ref 0.3–1.2)
Total Protein: 7 g/dL (ref 6.5–8.1)

## 2023-04-09 LAB — HCG, SERUM, QUALITATIVE: Preg, Serum: NEGATIVE

## 2023-04-09 LAB — CBC
HCT: 29.3 % — ABNORMAL LOW (ref 36.0–46.0)
Hemoglobin: 8.3 g/dL — ABNORMAL LOW (ref 12.0–15.0)
MCH: 19 pg — ABNORMAL LOW (ref 26.0–34.0)
MCHC: 28.3 g/dL — ABNORMAL LOW (ref 30.0–36.0)
MCV: 67 fL — ABNORMAL LOW (ref 80.0–100.0)
Platelets: 594 10*3/uL — ABNORMAL HIGH (ref 150–400)
RBC: 4.37 MIL/uL (ref 3.87–5.11)
RDW: 18.8 % — ABNORMAL HIGH (ref 11.5–15.5)
WBC: 6.6 10*3/uL (ref 4.0–10.5)
nRBC: 0 % (ref 0.0–0.2)

## 2023-04-09 LAB — HIV ANTIBODY (ROUTINE TESTING W REFLEX): HIV Screen 4th Generation wRfx: NONREACTIVE

## 2023-04-09 SURGERY — ARTHROSCOPY, KNEE
Anesthesia: General | Site: Knee | Laterality: Right

## 2023-04-09 MED ORDER — HYDROMORPHONE HCL 1 MG/ML IJ SOLN
1.0000 mg | INTRAMUSCULAR | Status: DC | PRN
  Administered 2023-04-09 – 2023-04-14 (×23): 1 mg via INTRAVENOUS
  Filled 2023-04-09 (×24): qty 1

## 2023-04-09 MED ORDER — OXYCODONE HCL 5 MG PO TABS: 5.0000 mg | ORAL_TABLET | Freq: Once | ORAL | Status: DC | PRN

## 2023-04-09 MED ORDER — OXYCODONE-ACETAMINOPHEN 5-325 MG PO TABS: 1.0000 | ORAL_TABLET | Freq: Four times a day (QID) | ORAL | Status: DC | PRN

## 2023-04-09 MED ORDER — ORAL CARE MOUTH RINSE: 15.0000 mL | Freq: Once | OROMUCOSAL | Status: AC

## 2023-04-09 MED ORDER — FENTANYL CITRATE (PF) 250 MCG/5ML IJ SOLN
INTRAMUSCULAR | Status: AC
  Filled 2023-04-09: qty 5

## 2023-04-09 MED ORDER — AMISULPRIDE (ANTIEMETIC) 5 MG/2ML IV SOLN: 10.0000 mg | Freq: Once | INTRAVENOUS | Status: DC | PRN

## 2023-04-09 MED ORDER — ONDANSETRON HCL 4 MG PO TABS: 4.0000 mg | ORAL_TABLET | Freq: Four times a day (QID) | ORAL | Status: DC | PRN

## 2023-04-09 MED ORDER — KETOROLAC TROMETHAMINE 15 MG/ML IJ SOLN
INTRAMUSCULAR | Status: DC | PRN
  Administered 2023-04-09: 15 mg via INTRAVENOUS

## 2023-04-09 MED ORDER — ENOXAPARIN SODIUM 40 MG/0.4ML IJ SOSY
40.0000 mg | PREFILLED_SYRINGE | INTRAMUSCULAR | Status: DC
  Administered 2023-04-09 – 2023-04-10 (×2): 40 mg via SUBCUTANEOUS
  Filled 2023-04-09 (×2): qty 0.4

## 2023-04-09 MED ORDER — CEFAZOLIN SODIUM 1 G IJ SOLR
INTRAMUSCULAR | Status: AC
  Filled 2023-04-09: qty 10

## 2023-04-09 MED ORDER — DEXAMETHASONE SODIUM PHOSPHATE 10 MG/ML IJ SOLN
INTRAMUSCULAR | Status: DC | PRN
  Administered 2023-04-09: 10 mg via INTRAVENOUS

## 2023-04-09 MED ORDER — ACETAMINOPHEN 325 MG PO TABS: 650.0000 mg | ORAL_TABLET | Freq: Four times a day (QID) | ORAL | Status: DC | PRN

## 2023-04-09 MED ORDER — DEXAMETHASONE SODIUM PHOSPHATE 10 MG/ML IJ SOLN
INTRAMUSCULAR | Status: AC
  Filled 2023-04-09: qty 1

## 2023-04-09 MED ORDER — CEFAZOLIN SODIUM-DEXTROSE 2-4 GM/100ML-% IV SOLN
2.0000 g | INTRAVENOUS | Status: AC
  Administered 2023-04-09: 3 g via INTRAVENOUS
  Filled 2023-04-09: qty 100

## 2023-04-09 MED ORDER — PANTOPRAZOLE SODIUM 40 MG PO TBEC
40.0000 mg | DELAYED_RELEASE_TABLET | Freq: Every day | ORAL | Status: DC
  Administered 2023-04-09 – 2023-04-18 (×9): 40 mg via ORAL
  Filled 2023-04-09 (×9): qty 1

## 2023-04-09 MED ORDER — FERROUS SULFATE 325 (65 FE) MG PO TABS
325.0000 mg | ORAL_TABLET | Freq: Every day | ORAL | Status: DC
  Administered 2023-04-09 – 2023-04-13 (×5): 325 mg via ORAL
  Filled 2023-04-09 (×5): qty 1

## 2023-04-09 MED ORDER — DEXMEDETOMIDINE HCL IN NACL 80 MCG/20ML IV SOLN
INTRAVENOUS | Status: DC | PRN
  Administered 2023-04-09: 8 ug via INTRAVENOUS
  Administered 2023-04-09: 12 ug via INTRAVENOUS

## 2023-04-09 MED ORDER — POVIDONE-IODINE 10 % EX SWAB
2.0000 | Freq: Once | CUTANEOUS | Status: AC
  Administered 2023-04-09: 2 via TOPICAL

## 2023-04-09 MED ORDER — DOCUSATE SODIUM 100 MG PO CAPS
100.0000 mg | ORAL_CAPSULE | Freq: Two times a day (BID) | ORAL | Status: DC
  Administered 2023-04-09 – 2023-04-13 (×9): 100 mg via ORAL
  Filled 2023-04-09 (×9): qty 1

## 2023-04-09 MED ORDER — OXYCODONE HCL 5 MG/5ML PO SOLN: 5.0000 mg | Freq: Once | ORAL | Status: DC | PRN

## 2023-04-09 MED ORDER — ONDANSETRON HCL 4 MG/2ML IJ SOLN
INTRAMUSCULAR | Status: DC | PRN
  Administered 2023-04-09: 4 mg via INTRAVENOUS

## 2023-04-09 MED ORDER — METHOCARBAMOL 500 MG PO TABS
ORAL_TABLET | ORAL | Status: AC
  Filled 2023-04-09: qty 1

## 2023-04-09 MED ORDER — HYDRALAZINE HCL 20 MG/ML IJ SOLN: 5.0000 mg | Freq: Four times a day (QID) | INTRAMUSCULAR | Status: DC | PRN

## 2023-04-09 MED ORDER — ONDANSETRON HCL 4 MG/2ML IJ SOLN: 4.0000 mg | Freq: Four times a day (QID) | INTRAMUSCULAR | Status: DC | PRN

## 2023-04-09 MED ORDER — LACTATED RINGERS IV SOLN: INTRAVENOUS | Status: DC

## 2023-04-09 MED ORDER — SODIUM CHLORIDE 0.9% FLUSH: 3.0000 mL | INTRAVENOUS | Status: DC | PRN

## 2023-04-09 MED ORDER — CHLORHEXIDINE GLUCONATE 0.12 % MT SOLN
15.0000 mL | Freq: Once | OROMUCOSAL | Status: AC
  Administered 2023-04-09: 15 mL via OROMUCOSAL
  Filled 2023-04-09: qty 15

## 2023-04-09 MED ORDER — ONDANSETRON HCL 4 MG/2ML IJ SOLN
INTRAMUSCULAR | Status: AC
  Filled 2023-04-09: qty 2

## 2023-04-09 MED ORDER — HYDROMORPHONE HCL 1 MG/ML IJ SOLN
1.0000 mg | Freq: Once | INTRAMUSCULAR | Status: AC
  Administered 2023-04-09: 1 mg via INTRAVENOUS
  Filled 2023-04-09: qty 1

## 2023-04-09 MED ORDER — LACTATED RINGERS IV SOLN: INTRAVENOUS | Status: AC

## 2023-04-09 MED ORDER — METHOCARBAMOL 500 MG PO TABS
500.0000 mg | ORAL_TABLET | Freq: Four times a day (QID) | ORAL | Status: DC | PRN
  Administered 2023-04-09 – 2023-04-14 (×8): 500 mg via ORAL
  Filled 2023-04-09 (×7): qty 1

## 2023-04-09 MED ORDER — LIDOCAINE 2% (20 MG/ML) 5 ML SYRINGE
INTRAMUSCULAR | Status: DC | PRN
  Administered 2023-04-09: 80 mg via INTRAVENOUS

## 2023-04-09 MED ORDER — SODIUM CHLORIDE 0.9% FLUSH
3.0000 mL | Freq: Two times a day (BID) | INTRAVENOUS | Status: DC
  Administered 2023-04-09 – 2023-04-19 (×13): 3 mL via INTRAVENOUS

## 2023-04-09 MED ORDER — PROMETHAZINE HCL 25 MG/ML IJ SOLN: 6.2500 mg | INTRAMUSCULAR | Status: DC | PRN

## 2023-04-09 MED ORDER — VANCOMYCIN HCL 2000 MG/400ML IV SOLN
2000.0000 mg | Freq: Once | INTRAVENOUS | Status: AC
  Administered 2023-04-09: 2000 mg via INTRAVENOUS
  Filled 2023-04-09: qty 400

## 2023-04-09 MED ORDER — SODIUM CHLORIDE 0.9 % IV SOLN: 250.0000 mL | INTRAVENOUS | Status: DC | PRN

## 2023-04-09 MED ORDER — HYDROMORPHONE HCL 1 MG/ML IJ SOLN
0.2500 mg | INTRAMUSCULAR | Status: DC | PRN
  Administered 2023-04-09 (×4): 0.5 mg via INTRAVENOUS

## 2023-04-09 MED ORDER — PROPOFOL 10 MG/ML IV BOLUS
INTRAVENOUS | Status: AC
  Filled 2023-04-09: qty 20

## 2023-04-09 MED ORDER — DIPHENHYDRAMINE HCL 12.5 MG/5ML PO ELIX: 12.5000 mg | ORAL_SOLUTION | ORAL | Status: DC | PRN

## 2023-04-09 MED ORDER — ALBUTEROL SULFATE (2.5 MG/3ML) 0.083% IN NEBU: 3.0000 mL | INHALATION_SOLUTION | Freq: Four times a day (QID) | RESPIRATORY_TRACT | Status: DC | PRN

## 2023-04-09 MED ORDER — ACETAMINOPHEN 650 MG RE SUPP: 650.0000 mg | Freq: Four times a day (QID) | RECTAL | Status: DC | PRN

## 2023-04-09 MED ORDER — MEPERIDINE HCL 25 MG/ML IJ SOLN: 6.2500 mg | INTRAMUSCULAR | Status: DC | PRN

## 2023-04-09 MED ORDER — PROPOFOL 10 MG/ML IV BOLUS
INTRAVENOUS | Status: DC | PRN
Start: 2023-04-09 — End: 2023-04-09
  Administered 2023-04-09: 200 mg via INTRAVENOUS

## 2023-04-09 MED ORDER — ACETAMINOPHEN 10 MG/ML IV SOLN
INTRAVENOUS | Status: DC | PRN
Start: 2023-04-09 — End: 2023-04-09
  Administered 2023-04-09: 1000 mg via INTRAVENOUS

## 2023-04-09 MED ORDER — CHLORHEXIDINE GLUCONATE 4 % EX SOLN: 60.0000 mL | Freq: Once | CUTANEOUS | Status: DC

## 2023-04-09 MED ORDER — KETOROLAC TROMETHAMINE 30 MG/ML IJ SOLN
INTRAMUSCULAR | Status: AC
  Filled 2023-04-09: qty 1

## 2023-04-09 MED ORDER — HYDROMORPHONE HCL 1 MG/ML IJ SOLN
INTRAMUSCULAR | Status: AC
  Administered 2023-04-09: 1 mg
  Filled 2023-04-09: qty 2

## 2023-04-09 MED ORDER — MIDAZOLAM HCL 2 MG/2ML IJ SOLN
INTRAMUSCULAR | Status: DC | PRN
  Administered 2023-04-09: 2 mg via INTRAVENOUS

## 2023-04-09 MED ORDER — METOCLOPRAMIDE HCL 5 MG PO TABS: 5.0000 mg | ORAL_TABLET | Freq: Three times a day (TID) | ORAL | Status: DC | PRN

## 2023-04-09 MED ORDER — FENTANYL CITRATE (PF) 250 MCG/5ML IJ SOLN
INTRAMUSCULAR | Status: DC | PRN
  Administered 2023-04-09 (×2): 100 ug via INTRAVENOUS
  Administered 2023-04-09: 50 ug via INTRAVENOUS

## 2023-04-09 MED ORDER — GADOBUTROL 1 MMOL/ML IV SOLN
10.0000 mL | Freq: Once | INTRAVENOUS | Status: AC | PRN
  Administered 2023-04-09: 10 mL via INTRAVENOUS

## 2023-04-09 MED ORDER — VANCOMYCIN HCL IN DEXTROSE 1-5 GM/200ML-% IV SOLN
1000.0000 mg | Freq: Two times a day (BID) | INTRAVENOUS | Status: DC
  Administered 2023-04-10 (×2): 1000 mg via INTRAVENOUS
  Filled 2023-04-09 (×3): qty 200

## 2023-04-09 MED ORDER — SENNOSIDES-DOCUSATE SODIUM 8.6-50 MG PO TABS: 1.0000 | ORAL_TABLET | Freq: Every evening | ORAL | Status: DC | PRN

## 2023-04-09 MED ORDER — LIDOCAINE 2% (20 MG/ML) 5 ML SYRINGE
INTRAMUSCULAR | Status: AC
  Filled 2023-04-09: qty 5

## 2023-04-09 MED ORDER — METOCLOPRAMIDE HCL 5 MG/ML IJ SOLN: 5.0000 mg | Freq: Three times a day (TID) | INTRAMUSCULAR | Status: DC | PRN

## 2023-04-09 MED ORDER — MIDAZOLAM HCL 2 MG/2ML IJ SOLN
INTRAMUSCULAR | Status: AC
  Filled 2023-04-09: qty 2

## 2023-04-09 MED ORDER — DEXMEDETOMIDINE HCL IN NACL 80 MCG/20ML IV SOLN
INTRAVENOUS | Status: AC
  Filled 2023-04-09: qty 20

## 2023-04-09 SURGICAL SUPPLY — 46 items
BAG COUNTER SPONGE SURGICOUNT (BAG) ×1 IMPLANT
BAG SPNG CNTER NS LX DISP (BAG) ×1
BANDAGE ESMARK 6X9 LF (GAUZE/BANDAGES/DRESSINGS) IMPLANT
BLADE CLIPPER SURG (BLADE) IMPLANT
BLADE EXCALIBUR 4.0X13 (MISCELLANEOUS) ×1 IMPLANT
BNDG CMPR 9X6 STRL LF SNTH (GAUZE/BANDAGES/DRESSINGS)
BNDG CMPR MED 10X6 ELC LF (GAUZE/BANDAGES/DRESSINGS) ×1
BNDG ELASTIC 6X10 VLCR STRL LF (GAUZE/BANDAGES/DRESSINGS) IMPLANT
BNDG ELASTIC 6X5.8 VLCR STR LF (GAUZE/BANDAGES/DRESSINGS) IMPLANT
BNDG ESMARK 6X9 LF (GAUZE/BANDAGES/DRESSINGS)
COVER SURGICAL LIGHT HANDLE (MISCELLANEOUS) ×1 IMPLANT
CUFF TOURN SGL QUICK 34 (TOURNIQUET CUFF)
CUFF TRNQT CYL 34X4.125X (TOURNIQUET CUFF) IMPLANT
DRAPE ARTHROSCOPY W/POUCH 114 (DRAPES) ×1 IMPLANT
DRAPE HALF SHEET 40X57 (DRAPES) ×1 IMPLANT
DRAPE U-SHAPE 47X51 STRL (DRAPES) ×1 IMPLANT
DURAPREP 26ML APPLICATOR (WOUND CARE) ×1 IMPLANT
DW OUTFLOW CASSETTE/TUBE SET (MISCELLANEOUS) IMPLANT
FACESHIELD WRAPAROUND (MASK) ×1 IMPLANT
FACESHIELD WRAPAROUND OR TEAM (MASK) ×1 IMPLANT
GAUZE PAD ABD 8X10 STRL (GAUZE/BANDAGES/DRESSINGS) IMPLANT
GAUZE SPONGE 4X4 12PLY STRL (GAUZE/BANDAGES/DRESSINGS) IMPLANT
GAUZE XEROFORM 1X8 LF (GAUZE/BANDAGES/DRESSINGS) ×1 IMPLANT
GLOVE BIO SURGEON STRL SZ7.5 (GLOVE) ×1 IMPLANT
GLOVE BIOGEL PI IND STRL 7.5 (GLOVE) ×1 IMPLANT
GLOVE BIOGEL PI IND STRL 8 (GLOVE) ×1 IMPLANT
GLOVE SURG SYN 7.5 E (GLOVE) ×1 IMPLANT
GLOVE SURG SYN 7.5 PF PI (GLOVE) ×1 IMPLANT
GOWN STRL REUS W/ TWL LRG LVL3 (GOWN DISPOSABLE) ×2 IMPLANT
GOWN STRL REUS W/ TWL XL LVL3 (GOWN DISPOSABLE) ×2 IMPLANT
GOWN STRL REUS W/TWL LRG LVL3 (GOWN DISPOSABLE) ×2
GOWN STRL REUS W/TWL XL LVL3 (GOWN DISPOSABLE) ×2
KIT TURNOVER KIT B (KITS) ×1 IMPLANT
MANIFOLD NEPTUNE II (INSTRUMENTS) IMPLANT
NS IRRIG 1000ML POUR BTL (IV SOLUTION) IMPLANT
PACK ARTHROSCOPY DSU (CUSTOM PROCEDURE TRAY) ×1 IMPLANT
PAD ABD 8X10 STRL (GAUZE/BANDAGES/DRESSINGS) IMPLANT
PAD ARMBOARD 7.5X6 YLW CONV (MISCELLANEOUS) ×2 IMPLANT
PADDING CAST COTTON 6X4 STRL (CAST SUPPLIES) IMPLANT
SPONGE T-LAP 4X18 ~~LOC~~+RFID (SPONGE) ×1 IMPLANT
SUT ETHILON 2 0 FS 18 (SUTURE) IMPLANT
TOWEL GREEN STERILE (TOWEL DISPOSABLE) ×1 IMPLANT
TOWEL GREEN STERILE FF (TOWEL DISPOSABLE) ×1 IMPLANT
TUBE CONNECTING 12X1/4 (SUCTIONS) ×1 IMPLANT
TUBING ARTHROSCOPY IRRIG 16FT (MISCELLANEOUS) ×1 IMPLANT
WATER STERILE IRR 1000ML POUR (IV SOLUTION) ×1 IMPLANT

## 2023-04-09 NOTE — Consult Note (Addendum)
I have seen and examined the patient. I have personally reviewed the clinical findings, laboratory findings, microbiological data and imaging studies. The assessment and treatment plan was discussed with the Nurse Practitioner. I agree with her/his recommendations except following additions/corrections.  47 year old female with prior history of asthma, PE who presented to the ED on 8/29 from home with right lateral knee pain radiating down to the foot as well as thigh, hip and buttock, onset a month ago.  Was seen in [facility] ED 7/31 where she was given analgesics.  X-ray showed moderate suprapatellar effusion.  They were unable to draw fluid from her knee during last visit. denies any known injuries/trauma/insect bite/steroid injections.  Denies fever. Smokes 1/2 pack of cigarettes, denies alcohol and IVDU.   At ED, tachycardic, afebrile Labs remarkable for WBC 14.1 8/29 S/p arthrocentesis -with purulent fluid.  No crystals, unable to calculate WBC, neutrophilic culture with GPC in pairs, in chains.  Was given IV cefazolin in the ED 8/29 blood cx pending   Exam -  adult female lying in the bed               HEENT wnl               Heart lung and abdomen wnl               Rt knee is wrapped in a bandage and would not let me touch due to pain/some swelling in rt leg   Labs/Imaging and microbiologic data reviewed   Will start Vancomycin post OR, pharmacy to dose Fu OR note, cx, blood cx 8/29 Monitor CBC, BMP and Vancomycin trough  I have personally spent 81 minutes involved in face-to-face and non-face-to-face activities for this patient on the day of the visit. Professional time spent includes the following activities: Preparing to see the patient (review of tests), Obtaining and/or reviewing separately obtained history (admission/discharge record), Performing a medically appropriate examination and/or evaluation , Ordering medications/tests/procedures, referring and communicating with other health  care professionals, Documenting clinical information in the EMR, Independently interpreting results (not separately reported), Communicating results to the patient/family/caregiver, Counseling and educating the patient/family/caregiver and Care coordination (not separately reported).    [facility]    Date of Admission:  04/08/2023     Total days of antibiotics 1               Reason for Consult: Septic arthritis   Referring Provider: Dr Elvera Lennox Primary Care Provider: Patient, No Pcp Per   ASSESSMENT:  Sharon Faulkner is a 47 y/o female presenting with month long knee effusion and pain found to have right knee septic arthritis with initial gram stain with gram positive cocci. Antibiotics currently held for surgery today with planned debridement. Following surgery would start vancomycin per pharmacy given gram positive cocci on synovial gram stain. Continue to monitor synovial fluid for organism identification and await any additional surgical cultures per Orthopedics. Remaining medical and supportive care per Internal Medicine.   PLAN:  Hold antibiotics through surgery to optimize any surgical specimen.  Start vancomycin per pharmacy after surgery Debridement today per Orthopedics.  Remaining medical and supportive care per Internal Medicine.   Principal Problem:   Septic arthritis of knee, right (HCC) Active Problems:   Asthma   Chronic anemia    enoxaparin (LOVENOX) injection  40 mg Subcutaneous Q24H   ferrous sulfate  325 mg Oral Daily   sodium chloride flush  3 mL Intravenous Q12H  HPI: Sharon Faulkner is a 47 y.o. female with previous medical history of asthma presenting to the ED with right knee pain.   Sharon Faulkner was initially seen at the [facility] ED on 03/11/23 with right knee pain without any known trauma, injury, or procedures. X-ray with moderate suprapatellar effusion and no bony abnormalities. Had leukocytosis with WBC count of 14.  Aspiration was unable to be completed and discharged with oral steroid and instructed on Orthopedic follow up.   Sharon Faulkner presented to the ED on 8/28 with persistent knee pain that was refractory to OTC anti-inflammatories. Right knee x-ray with persistent moderate to large knee joint effusion and probable erosive changes in the medial femoral condyle and medial tibial plateau suspicious for septic arthritis. Aspiration of right knee revealed neutrophil percentage of 96 with no crystals. WBC count was unable to be completed secondary to clotting. Synovial fluid has gram positive cocci in pairs on gram stain with culture pending. Received 1 dose of Cefazolin and not currently on antibiotics with plan for OR for debridement today with Dr. Eulah Pont.   Review of Systems: Review of Systems  Constitutional:  Negative for chills, fever and weight loss.  Respiratory:  Negative for cough, shortness of breath and wheezing.   Cardiovascular:  Negative for chest pain and leg swelling.  Gastrointestinal:  Negative for abdominal pain, constipation, diarrhea, nausea and vomiting.  Musculoskeletal:        Right knee pain with radiculopathy  Skin:  Negative for rash.     Past Medical History:  Diagnosis Date   Asthma    History reviewed. No pertinent surgical history.  Social History   Tobacco Use   Smoking status: Every Day    Current packs/day: 0.50    Types: Cigarettes   Smokeless tobacco: Never  Substance Use Topics   Drug use: No    History reviewed. No pertinent family history.  Allergies  Allergen Reactions   Shellfish Allergy Anaphylaxis and Itching   Cocos Nucifera Hives   Codeine Itching   Mango Flavor Itching    OBJECTIVE: Blood pressure (!) 130/91, pulse 77, temperature 98.3 F (36.8 C), resp. rate 19, height 5\' 4"  (1.626 m), weight 113.4 kg, last menstrual period 03/09/2023, SpO2 97%.  Physical Exam Constitutional:      General: She is not in acute distress.     Appearance: She is well-developed. She is obese.  Cardiovascular:     Rate and Rhythm: Normal rate and regular rhythm.     Heart sounds: Normal heart sounds.  Pulmonary:     Effort: Pulmonary effort is normal.     Breath sounds: Normal breath sounds.  Musculoskeletal:     Comments: Right knee and distal lower leg edematous with generalized tenderness.   Skin:    General: Skin is warm and dry.  Neurological:     Mental Status: She is alert and oriented to person, place, and time.    Lab Results Lab Results  Component Value Date   WBC 6.6 04/09/2023   HGB 8.3 (L) 04/09/2023   HCT 29.3 (L) 04/09/2023   MCV 67.0 (L) 04/09/2023   PLT 594 (H) 04/09/2023    Lab Results  Component Value Date   CREATININE 0.75 04/09/2023   BUN 12 04/09/2023   NA 139 04/09/2023   K 3.9 04/09/2023   CL 106 04/09/2023   CO2 24 04/09/2023    Lab Results  Component Value Date   ALT 10 04/09/2023   AST 13 (L) 04/09/2023  ALKPHOS 65 04/09/2023   BILITOT 0.3 04/09/2023     Microbiology: Recent Results (from the past 240 hour(s))  Body fluid culture w Gram Stain     Status: None (Preliminary result)   Collection Time: 04/08/23  6:34 PM   Specimen: KNEE; Body Fluid  Result Value Ref Range Status   Specimen Description   Final    KNEE Performed at [facility], [address] [place], [place] [address]    Special Requests   Final    NONE Performed at [facility], [address] [place], [place] [address]    Gram Stain   Final    ABUNDANT WBC PRESENT, PREDOMINANTLY PMN RARE GRAM POSITIVE COCCI IN PAIRS IN CHAINS Gram Stain Report Called to,Read Back By and Verified With: RN . A. Normand Sloop [id number] @ 2236 FH Performed at [facility], [address], [place], [place] [address]    Culture PENDING  Incomplete   Report Status PENDING  Incomplete   Imaging MR KNEE RIGHT W WO CONTRAST  Result Date: 04/09/2023 CLINICAL DATA:  Septic arthritis suspected. Knee swelling  for 1 month. In emergency department arthrocentesis was performed and fluid showed numerous white blood cells and Gram-positive cocci unchanged. Scheduled for operating room washout. No known injury. EXAM: MRI OF THE RIGHT KNEE WITHOUT AND WITH CONTRAST TECHNIQUE: Multiplanar, multisequence MR imaging of the right knee was performed both before and after administration of intravenous contrast. Patient unable to straighten leg and could not be plate and correcting anatomical position. CONTRAST:  10mL GADAVIST GADOBUTROL 1 MMOL/ML IV SOLN COMPARISON:  Right knee radiographs 04/08/2023 and 03/11/2023 FINDINGS: MENISCI Medial meniscus: There is high-grade attenuation of the medial meniscus with only a small portion of the predominantly peripheral third of the meniscal triangle remaining within the body, anterior horn, and posterior horn. Lateral meniscus: Complex tearing throughout the majority of the posterior horn and body of the lateral meniscus predominantly involving the middle and central thirds of the meniscal triangle. The anterior horn of the lateral meniscus is not well visualized. LIGAMENTS Cruciates: The ACL and PCL are intact. Collaterals: Joint effusion and overlying medial knee soft tissue swelling cause medial bowing of the medial collateral ligament (coronal series 6, image 20). There is mild intermediate T2 signal within the ligament likely from the surrounding foot and edema. No ligament tear is seen. The fibular collateral ligament, biceps femoris tendon, iliotibial band, and popliteus tendon are intact. CARTILAGE Patellofemoral: There is high-grade thinning of the inferior medial and inferolateral trochlear cartilage. High-grade thinning of the mid to inferior aspect of the lateral patellar facet cartilage. Medial: There is full-thickness cartilage loss within the far medial aspect of the weight-bearing medial femoral condyle where there is new cortical erosion as seen on yesterday's radiograph, new  from 03/11/2023 radiographs. High-grade thinning of the rest of the medial compartment cartilage, greatest within the weight-bearing medial femoral condyle. Lateral: High-grade thinning throughout the weight-bearing lateral femoral condyle. Full-thickness cartilage loss throughout the mid AP dimension of the far lateral aspect of the lateral tibial plateau (sagittal series 4, image 26). Joint: Large joint effusion with high-grade synovial thickening and synovial enhancement. Scattered intermediate T2 signal synovitis.Normal Hoffa's fat pad. No plical thickening. Popliteal Fossa:  No Baker's cyst. Extensor Mechanism:  Intact quadriceps tendon and patellar tendon. Bones: There is high-grade marrow edema and enhancement throughout the entire medial femoral condyle and medial tibial plateau. There is cortical erosion within the medial aspect of the  weight-bearing medial femoral condyle greater than the medial tibial plateau, as seen on yesterday's radiographs. There is more mild subchondral marrow edema enhancement within the lateral femoral condyle and lateral tibial plateau which is also suspicious for osteomyelitis. Other: None. IMPRESSION: 1. Large joint effusion with high-grade synovial thickening and synovial enhancement. There is also high-grade marrow edema and enhancement throughout the entire medial femoral condyle and medial tibial plateau with cortical erosion within the medial aspect of the weight-bearing medial femoral condyle greater than the medial tibial plateau. More mild subchondral marrow edema enhancement within the lateral femoral condyle and lateral tibial plateau. Findings are highly suspicious for a septic arthritis and osteomyelitis. 2. Complex tearing throughout the majority of the posterior horn and body of the lateral meniscus. 3. High-grade attenuation of the medial meniscus with only a small portion of the predominantly peripheral third of the meniscal triangle remaining within the body,  anterior horn, and posterior horn. 4. Tricompartmental cartilage degenerative changes, greatest within the medial compartment. Electronically Signed   By: Neita Garnet M.D.   On: 04/09/2023 10:41   US Venous Img Lower Unilateral Right  Result Date: 04/08/2023 CLINICAL DATA:  Right lower extremity swelling EXAM: Right LOWER EXTREMITY VENOUS DOPPLER ULTRASOUND TECHNIQUE: Gray-scale sonography with compression, as well as color and duplex ultrasound, were performed to evaluate the deep venous system(s) from the level of the common femoral vein through the popliteal and proximal calf veins. COMPARISON:  None Available. FINDINGS: VENOUS Normal compressibility of the common femoral, superficial femoral, and popliteal veins, as well as the visualized calf veins. Visualized portions of profunda femoral vein and great saphenous vein unremarkable. No filling defects to suggest DVT on grayscale or color Doppler imaging. Doppler waveforms show normal direction of venous flow, normal respiratory plasticity and response to augmentation. Limited views of the contralateral common femoral vein are unremarkable. OTHER There is a oblong fluid collection noted anterior to the knee in the area of pain measuring 4.7 x 1.4 x 5.0 cm. No abnormal blood flow on color Doppler. Limitations: none IMPRESSION: No evidence of right lower extremity DVT. Fluid collection anterior to the right knee in the area of pain as per the sonographer measuring 4.7 x 1.4 x 5.0 cm. This a differential. Please correlate for any signs of infection or trauma. Further workup as clinically appropriate or follow-up. Electronically Signed   By: Karen Kays M.D.   On: 04/08/2023 17:07   DG Knee Complete 4 Views Right  Result Date: 04/08/2023 CLINICAL DATA:  Right knee pain for 1 month.  No known injury. EXAM: RIGHT KNEE - COMPLETE 4+ VIEW COMPARISON:  Radiographs 03/11/2023 and 08/07/2020 FINDINGS: Suboptimal positioning, especially on the lateral view. The  images are not labeled as having been obtained erect. There is new medial compartment joint space narrowing compared with the prior study of 4 weeks ago, and this is associated with probable erosive changes peripherally in the medial femoral condyle and medial tibial plateau. The lateral and patellofemoral joint spaces appear preserved. Persistent moderate to large knee joint effusion, similar to the recent prior study. No evidence of foreign body or soft tissue emphysema. IMPRESSION: New medial compartment joint space narrowing with probable erosive changes peripherally in the medial femoral condyle and medial tibial plateau. Persistent moderate to large knee joint effusion. Findings are suspicious for septic arthritis. Correlate clinically and consider further evaluation with MRI and arthrocentesis. Electronically Signed   By: Carey Bullocks M.D.   On: 04/08/2023 16:21   DG Knee Complete  4 Views Right  Result Date: 03/11/2023 CLINICAL DATA:  Right knee pain. EXAM: RIGHT KNEE - COMPLETE 4+ VIEW COMPARISON:  Right knee radiograph dated 08/07/2020. FINDINGS: There is no acute fracture or dislocation. The bones are well mineralized. Mild arthritic changes. There is a moderate suprapatellar effusion. The soft tissues are unremarkable. IMPRESSION: 1. No acute fracture or dislocation. 2. Moderate suprapatellar effusion. Electronically Signed   By: Elgie Collard M.D.   On: 03/11/2023 03:54      Marcos Eke, NP [facility]  04/09/2023  10:01 AM

## 2023-04-09 NOTE — Anesthesia Preprocedure Evaluation (Signed)
Anesthesia Evaluation  Patient identified by MRN, date of birth, ID band Patient awake    Reviewed: Allergy & Precautions, H&P , NPO status , Patient's Chart, lab work & pertinent test results  Airway Mallampati: II  TM Distance: >3 FB Neck ROM: Full    Dental no notable dental hx.    Pulmonary asthma , Current Smoker and Patient abstained from smoking.   Pulmonary exam normal breath sounds clear to auscultation       Cardiovascular negative cardio ROS Normal cardiovascular exam Rhythm:Regular Rate:Normal     Neuro/Psych negative neurological ROS  negative psych ROS   GI/Hepatic negative GI ROS, Neg liver ROS,,,  Endo/Other    Morbid obesity  Renal/GU negative Renal ROS  negative genitourinary   Musculoskeletal  (+) Arthritis , Osteoarthritis,    Abdominal   Peds negative pediatric ROS (+)  Hematology  (+) Blood dyscrasia, anemia   Anesthesia Other Findings   Reproductive/Obstetrics negative OB ROS                             Anesthesia Physical Anesthesia Plan  ASA: 3  Anesthesia Plan: General   Post-op Pain Management:    Induction: Intravenous  PONV Risk Score and Plan: 2 and Ondansetron, Midazolam and Treatment may vary due to age or medical condition  Airway Management Planned: LMA  Additional Equipment:   Intra-op Plan:   Post-operative Plan: Extubation in OR  Informed Consent: I have reviewed the patients History and Physical, chart, labs and discussed the procedure including the risks, benefits and alternatives for the proposed anesthesia with the patient or authorized representative who has indicated his/her understanding and acceptance.     Dental advisory given  Plan Discussed with: CRNA  Anesthesia Plan Comments:        Anesthesia Quick Evaluation

## 2023-04-09 NOTE — Progress Notes (Signed)
Patient seen and examined this morning, admitted overnight, H&P reviewed and I agree with the assessment and plan.  47 year old female with obesity, asthma comes into the hospital with knee swelling for the past several weeks.  She denies any trauma to the knee, denies any injuries, bug bites, she denies any IV drug use, no recent surgeries.  Arthrocentesis in the ED showed gram-positive cocci in chains, and orthopedic surgery was consulted and she was admitted to the hospital  Principal problem Right knee septic arthritis -hemodynamically stable, this appears to be going on for several weeks.  Orthopedic surgery consulted, she will be taken to the OR this afternoon.  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 Elvera Lennox, MD, PhD [facility]  Between 7 am - 7 pm you can contact me via Amion (for emergencies) or Securechat (non urgent matters).  I am not available 7 pm - 7 am, please contact night coverage MD/APP via Amion

## 2023-04-09 NOTE — Progress Notes (Addendum)
Pharmacy Antibiotic Note  Sharon Faulkner is a 47 y.o. female admitted on 04/08/2023 with  septic arthritis . Pharmacy has been consulted for vancomycin dosing.  Plans for patient to go to OR today around 1500, orders placed for after surgery.  Plan: After procedure, give vancomycin loading dose 2000 mg IV x1 Start vancomycin 1000 mg IV q12h eAUC 475 (Scr 0.8, rounded; Vd 0.5) Draw vanc peak/trough as clinically indicated Monitor renal function, cultures, and clinical status   Height: 5\' 4"  (162.6 cm) Weight: 113.4 kg (250 lb) IBW/kg (Calculated) : 54.7  Temp (24hrs), Avg:98.3 F (36.8 C), Min:98 F (36.7 C), Max:98.8 F (37.1 C)  Recent Labs  Lab 04/08/23 1515 04/09/23 0624  WBC 7.6 6.6  CREATININE 0.67 0.75    Estimated Creatinine Clearance: 106.2 mL/min (by C-G formula based on SCr of 0.75 mg/dL).    Allergies  Allergen Reactions   Shellfish Allergy Anaphylaxis and Itching   Cocos Nucifera Hives   Codeine Itching   Mango Flavor Itching    Antimicrobials this admission: 8/28 cefazolin 2g x1 8/29 vancomycin >>  Dose adjustments this admission: none  Microbiology results: 8/29 BCx: ngtd < 12hrs 8/28 knee aspirate cx: abundant WBC, GPC in pairs/chains   Thank you for allowing pharmacy to be a part of this patient's care.  Romie Minus, PharmD PGY1 Pharmacy Resident  Please check AMION for all [facility] Pharmacy phone numbers After 10:00 PM, call Main Pharmacy [phone]

## 2023-04-09 NOTE — Progress Notes (Signed)
CCC Pre-op Review  Pre-op checklist: asked RN to complete  NPO: yes  Labs: PCR needs to be collected, HCG negative, HGB 8.3  Consent: surgery orders released,   H&P: 8/29, consult 8/29  Vitals: stable, bp a little elevated  O2 requirements: none  MAR/PTA review: lovonox 0748, dilaudid at 0746,   IV: 20G L AC  Floor nurse name:  Gomez Cleverly  Additional info:  Patient has not been able to bare wt on right knee Hx asthma

## 2023-04-09 NOTE — ED Notes (Signed)
[facility] reached out for transport @ 00:10  No ETA currently.Sharon Faulkner

## 2023-04-09 NOTE — Transfer of Care (Signed)
Immediate Anesthesia Transfer of Care Note  Patient: Sharon Faulkner  Procedure(s) Performed: ARTHROSCOPY KNEE RIGHT (Right: Knee)  Patient Location: PACU  Anesthesia Type:General  Level of Consciousness: awake, alert , and oriented  Airway & Oxygen Therapy: Patient Spontanous Breathing and Patient connected to face mask oxygen  Post-op Assessment: Report given to RN and Post -op Vital signs reviewed and stable  Post vital signs: Reviewed and stable  Last Vitals:  Vitals Value Taken Time  BP 159/111 04/09/23 1605  Temp    Pulse 90 04/09/23 1611  Resp 15 04/09/23 1611  SpO2 93 % 04/09/23 1611  Vitals shown include unfiled device data.  Last Pain:  Vitals:   04/09/23 1605  TempSrc:   PainSc: 9          Complications: No notable events documented.

## 2023-04-09 NOTE — Plan of Care (Signed)

## 2023-04-09 NOTE — H&P (Addendum)
History and Physical    Sharon Faulkner ZOX:[id number] DOB: 1975-10-23 DOA: 04/08/2023  PCP: Patient, No Pcp Per   Patient coming from: Home   Chief Complaint:  Chief Complaint  Patient presents with   Leg Pain    HPI:  Sharon Faulkner is a 47 y.o. female with medical history significant of asthma presented to emergency department complaining of knee swelling for 1 month.  Per chart review while patient in the ED Dr. Anitra Lauth performed an arthrocentesis and fluid study has been sent which showed numerous WBC and gram-positive cocci in chains. EDP physician has been discussed case with orthopedics Dr. Eulah Pont who recommended hold antibiotic, obtain MRI, keep patient n.p.o. and patient has been scheduled for to the OR in the morning. Give arthrocentesis which showed gram-positive cocci in chain in the ED patient has been treated with Ancef 2 g IV once with concern for septic arthritis.  During my evaluation patient reported that she developed progressively worsening right-sided knee joint pain over the course of last 2 to 3 weeks.  She denies any injury, fall and any kind of cut/laceration of the knee joint.  Patient denies any fever and chills.  She is complaining about 8 out of 10 intensity right-sided knee joint pain which is radiating to her right lower leg up to her ankle.  Patient does not have any other complaint at this time.  ED Course:  At initial presentation patient found tachycardic 102, slightly evaded blood pressure 138/107, respiratory rate 20 and O2 sat 99% room air. CBC grossly unremarkable. BMP grossly unremarkable. ESR elevated 100 and C-reactive protein pending.  X-ray of the right knee New medial compartment joint space narrowing with probable erosive changes peripherally in the medial femoral condyle and medial tibial plateau. Persistent moderate to large knee joint effusion. Findings are suspicious for septic arthritis. Correlate clinically and consider further evaluation  with MRI and arthrocentesis.  Ultrasound of lower extremity ruled out DVT.Fluid collection anterior to the right knee in the area of pain as per the sonographer measuring 4.7 x 1.4 x 5.0 cm. This a differential. Please correlate for any signs of infection or trauma. Further workup as clinically appropriate or follow-up.  Pending MRI of the right knee.  Patient has been transferred to [facility] for further management care of septic arthritis.  Review of Systems:  Review of Systems  Constitutional:  Negative for chills, fever, malaise/fatigue and weight loss.  Respiratory:  Negative for cough.   Cardiovascular:  Negative for chest pain.  Gastrointestinal:  Negative for heartburn, nausea and vomiting.  Genitourinary:  Negative for dysuria and urgency.  Musculoskeletal:  Positive for joint pain. Negative for back pain, falls and myalgias.  Skin:  Negative for itching and rash.  Neurological:  Negative for headaches.  Psychiatric/Behavioral:  The patient is nervous/anxious.     Past Medical History:  Diagnosis Date   Asthma     History reviewed. No pertinent surgical history.   reports that she has been smoking cigarettes. She has never used smokeless tobacco. She reports that she does not use drugs. No history on file for alcohol use.  Allergies  Allergen Reactions   Shellfish Allergy Anaphylaxis and Itching   Cocos Nucifera Hives   Codeine Itching   Mango Flavor Itching    History reviewed. No pertinent family history.  Prior to Admission medications   Medication Sig Start Date End Date Taking? Authorizing Provider  albuterol (VENTOLIN HFA) 108 (90 Base) MCG/ACT inhaler Inhale 2 puffs into the  lungs every 6 (six) hours as needed for wheezing or shortness of breath.  06/18/20   [provider]  dexamethasone (DECADRON) 6 MG tablet Take 1 tablet (6 mg total) by mouth daily. Patient not taking: No sig reported 07/23/20   Tyrone Nine, MD  ferrous sulfate 325 (65  FE) MG tablet Take 1 tablet (325 mg total) by mouth daily. Patient not taking: No sig reported 06/21/20   Dione Booze, MD  methocarbamol (ROBAXIN) 500 MG tablet Take 1 tablet (500 mg total) by mouth every 6 (six) hours as needed for muscle spasms. 08/07/20   Long, Arlyss Repress, MD  naproxen (NAPROSYN) 375 MG tablet Take 1 tablet (375 mg total) by mouth 2 (two) times daily. 03/11/23   Gwyneth Sprout, MD  oxyCODONE-acetaminophen (PERCOCET/ROXICET) 5-325 MG tablet Take 1 tablet by mouth every 6 (six) hours as needed for severe pain. 03/11/23   Gwyneth Sprout, MD  predniSONE (DELTASONE) 20 MG tablet Take 2 tablets (40 mg total) by mouth daily. 03/11/23   Gwyneth Sprout, MD     Physical Exam: Vitals:   04/08/23 2342 04/09/23 0000 04/09/23 0132 04/09/23 0200  BP:  (!) 149/102 (!) 157/99 (!) 157/99  Pulse:  86 88 88  Resp:  12 17 17   Temp: 98.8 F (37.1 C)  98.2 F (36.8 C) 98.2 F (36.8 C)  TempSrc: Oral   Oral  SpO2:  98% 96% 98%  Weight:    113.4 kg  Height:    5\' 4"  (1.626 m)    Physical Exam Constitutional:      Appearance: She is obese.  HENT:     Head: Normocephalic.     Mouth/Throat:     Mouth: Mucous membranes are moist.  Eyes:     Pupils: Pupils are equal, round, and reactive to light.  Cardiovascular:     Rate and Rhythm: Normal rate and regular rhythm.     Pulses: Normal pulses.     Heart sounds: Normal heart sounds.  Pulmonary:     Effort: Pulmonary effort is normal.     Breath sounds: Normal breath sounds.  Abdominal:     General: Bowel sounds are normal. There is no distension.     Tenderness: There is no abdominal tenderness.  Musculoskeletal:     Cervical back: Neck supple.     Right lower leg: No edema.     Left lower leg: No edema.     Comments: Right knee tender on palpation.  Skin:    General: Skin is warm.     Capillary Refill: Capillary refill takes less than 2 seconds.  Neurological:     Mental Status: She is oriented to person, place, and  time.  Psychiatric:        Mood and Affect: Mood normal.        Thought Content: Thought content normal.      Labs on Admission: I have personally reviewed following labs and imaging studies  CBC: Recent Labs  Lab 04/08/23 1515  WBC 7.6  NEUTROABS 4.9  HGB 8.9*  HCT 31.1*  MCV 67.0*  PLT 677*   Basic Metabolic Panel: Recent Labs  Lab 04/08/23 1515  NA 139  K 4.0  CL 105  CO2 25  GLUCOSE 91  BUN 16  CREATININE 0.67  CALCIUM 9.0   GFR: Estimated Creatinine Clearance: 106.2 mL/min (by C-G formula based on SCr of 0.67 mg/dL). Liver Function Tests: No results for input(s): "AST", "ALT", "ALKPHOS", "BILITOT", "PROT", "ALBUMIN" in the  last 168 hours. No results for input(s): "LIPASE", "AMYLASE" in the last 168 hours. No results for input(s): "AMMONIA" in the last 168 hours. Coagulation Profile: No results for input(s): "INR", "PROTIME" in the last 168 hours. Cardiac Enzymes: No results for input(s): "CKTOTAL", "CKMB", "CKMBINDEX", "TROPONINI", "TROPONINIHS" in the last 168 hours. BNP (last 3 results) No results for input(s): "BNP" in the last 8760 hours. HbA1C: No results for input(s): "HGBA1C" in the last 72 hours. CBG: No results for input(s): "GLUCAP" in the last 168 hours. Lipid Profile: No results for input(s): "CHOL", "HDL", "LDLCALC", "TRIG", "CHOLHDL", "LDLDIRECT" in the last 72 hours. Thyroid Function Tests: No results for input(s): "TSH", "T4TOTAL", "FREET4", "T3FREE", "THYROIDAB" in the last 72 hours. Anemia Panel: No results for input(s): "VITAMINB12", "FOLATE", "FERRITIN", "TIBC", "IRON", "RETICCTPCT" in the last 72 hours. Urine analysis:    Component Value Date/Time   COLORURINE YELLOW 06/20/2020 2029   APPEARANCEUR CLEAR 06/20/2020 2029   LABSPEC 1.023 06/20/2020 2029   PHURINE 5.0 06/20/2020 2029   GLUCOSEU NEGATIVE 06/20/2020 2029   HGBUR NEGATIVE 06/20/2020 2029   BILIRUBINUR NEGATIVE 06/20/2020 2029   KETONESUR NEGATIVE 06/20/2020 2029    PROTEINUR NEGATIVE 06/20/2020 2029   NITRITE NEGATIVE 06/20/2020 2029   LEUKOCYTESUR NEGATIVE 06/20/2020 2029    Radiological Exams on Admission: I have personally reviewed images US Venous Img Lower Unilateral Right  Result Date: 04/08/2023 CLINICAL DATA:  Right lower extremity swelling EXAM: Right LOWER EXTREMITY VENOUS DOPPLER ULTRASOUND TECHNIQUE: Gray-scale sonography with compression, as well as color and duplex ultrasound, were performed to evaluate the deep venous system(s) from the level of the common femoral vein through the popliteal and proximal calf veins. COMPARISON:  None Available. FINDINGS: VENOUS Normal compressibility of the common femoral, superficial femoral, and popliteal veins, as well as the visualized calf veins. Visualized portions of profunda femoral vein and great saphenous vein unremarkable. No filling defects to suggest DVT on grayscale or color Doppler imaging. Doppler waveforms show normal direction of venous flow, normal respiratory plasticity and response to augmentation. Limited views of the contralateral common femoral vein are unremarkable. OTHER There is a oblong fluid collection noted anterior to the knee in the area of pain measuring 4.7 x 1.4 x 5.0 cm. No abnormal blood flow on color Doppler. Limitations: none IMPRESSION: No evidence of right lower extremity DVT. Fluid collection anterior to the right knee in the area of pain as per the sonographer measuring 4.7 x 1.4 x 5.0 cm. This a differential. Please correlate for any signs of infection or trauma. Further workup as clinically appropriate or follow-up. Electronically Signed   By: Karen Kays M.D.   On: 04/08/2023 17:07   DG Knee Complete 4 Views Right  Result Date: 04/08/2023 CLINICAL DATA:  Right knee pain for 1 month.  No known injury. EXAM: RIGHT KNEE - COMPLETE 4+ VIEW COMPARISON:  Radiographs 03/11/2023 and 08/07/2020 FINDINGS: Suboptimal positioning, especially on the lateral view. The images are not  labeled as having been obtained erect. There is new medial compartment joint space narrowing compared with the prior study of 4 weeks ago, and this is associated with probable erosive changes peripherally in the medial femoral condyle and medial tibial plateau. The lateral and patellofemoral joint spaces appear preserved. Persistent moderate to large knee joint effusion, similar to the recent prior study. No evidence of foreign body or soft tissue emphysema. IMPRESSION: New medial compartment joint space narrowing with probable erosive changes peripherally in the medial femoral condyle and medial tibial plateau. Persistent moderate  to large knee joint effusion. Findings are suspicious for septic arthritis. Correlate clinically and consider further evaluation with MRI and arthrocentesis. Electronically Signed   By: Carey Bullocks M.D.   On: 04/08/2023 16:21        Assessment/Plan: Principal Problem:   Septic arthritis of knee, right (HCC) Active Problems:   Asthma   Chronic anemia    Assessment and Plan: Right knee septic arthritis Patient coming with complaining of right-sided knee swelling and pain over the course of last 1 month. -Patient is afebrile and hemodynamically stable CBC and CMP grossly unremarkable - X-ray of the right knee New medial compartment joint space narrowing with probable erosive changes peripherally in the medial femoral condyle and medial tibial plateau. Persistent moderate to large knee joint effusion. Findings are suspicious for septic arthritis -Pending MRI of the right knee. -While in the ED patient underwent right knee arthrocentesis, arthrocentesis fluid showed numerous WBC and micro lab reported gram-positive cocci in chains.  In the ED patient received cefazolin 2 g IV once. -Obtaining blood cultures. -EDP physician has been discussed case with orthopedics Dr. Eulah Pont. - Orthopedic surgeon Dr. Margarita Rana on board, recommended hold antibiotic as of now  and plan for right knee joint washout in the morning.  I have also reached out to Dr. Eulah Pont about the Gram stain finding and he is recommending to hold the antibiotic and requested for obtain the MRI for tonight. - Keeping patient n.p.o. and continue maintenance fluid LR 75 cc/h for 1 day. -Continue pain management with Percocet 1 mg every 6 hour as needed and Dilaudid 1 mg every 2 hour as needed for severe and breakthrough pain. -Continue Robaxin as needed.   History of asthma -Well-controlled. - Continue Proventil as needed for wheezing shortness of breath.  Chronic anemia -Patient has history of chronic microcytic anemia.  Stable H&H 8.9 and 31.1 -Continue iron supplement.   DVT prophylaxis:  Lovenox Code Status:  Full Code Diet: Currently n.p.o. Family Communication: Unable to update any family member at this time. Disposition Plan: Pending clinical improvement and right knee joint washout.  Pending disposition. Consults: Orthopedic surgeon Admission status:   Inpatient, Med-Surg  Severity of Illness: The appropriate patient status for this patient is INPATIENT. Inpatient status is judged to be reasonable and necessary in order to provide the required intensity of service to ensure the patient's safety. The patient's presenting symptoms, physical exam findings, and initial radiographic and laboratory data in the context of their chronic comorbidities is felt to place them at high risk for further clinical deterioration. Furthermore, it is not anticipated that the patient will be medically stable for discharge from the hospital within 2 midnights of admission.   * I certify that at the point of admission it is my clinical judgment that the patient will require inpatient hospital care spanning beyond 2 midnights from the point of admission due to high intensity of service, high risk for further deterioration and high frequency of surveillance required.Marland Kitchen    Sharon Coop, MD [facility]  How to contact the [facility] Attending or Consulting provider 7A - 7P or covering provider during after hours 7P -7A, for this patient.  Check the care team in [facility] and look for a) attending/consulting [facility] provider listed and b) the [facility] team listed Log into [email or web address] and use [place]'s universal password to access. If you do not have the password, please contact the hospital operator. Locate the [facility] provider you are looking for under [facility]  and page to a number that you can be directly reached. If you still have difficulty reaching the provider, please page the [facility] (Director on Call) for the Hospitalists listed on amion for assistance.  04/09/2023, 2:16 AM

## 2023-04-09 NOTE — H&P (View-Only) (Signed)
Reason for Consult:Right knee pain Referring Physician: Pamella Pert Time called: 0904 Time at bedside: 1011   Sharon Faulkner is an 47 y.o. female.  HPI: Sharon Faulkner developed right knee pain and swelling in late July. She was seen in the ED 7/31 where aspiration was unsuccessful and given steroids and pain meds. These did not help. She quickly progressed to being unable to bear weight and has been getting around in a [facility] since late July. She denies any prior issues with the knee.  Past Medical History:  Diagnosis Date   Asthma     History reviewed. No pertinent surgical history.  History reviewed. No pertinent family history.  Social History:  reports that she has been smoking cigarettes. She has never used smokeless tobacco. She reports that she does not use drugs. No history on file for alcohol use.  Allergies:  Allergies  Allergen Reactions   Shellfish Allergy Anaphylaxis and Itching   Cocos Nucifera Hives   Codeine Itching   Mango Flavor Itching    Medications: I have reviewed the patient's current medications.  Results for orders placed or performed during the hospital encounter of 04/08/23 (from the past 48 hour(s))  Basic metabolic panel     Status: None   Collection Time: 04/08/23  3:15 PM  Result Value Ref Range   Sodium 139 135 - 145 mmol/L   Potassium 4.0 3.5 - 5.1 mmol/L   Chloride 105 98 - 111 mmol/L   CO2 25 22 - 32 mmol/L   Glucose, Bld 91 70 - 99 mg/dL    Comment: Glucose reference range applies only to samples taken after fasting for at least 8 hours.   BUN 16 6 - 20 mg/dL   Creatinine, Ser 8.29 0.44 - 1.00 mg/dL   Calcium 9.0 8.9 - 56.2 mg/dL   GFR, Estimated >13 >08 mL/min    Comment: (NOTE) Calculated using the CKD-EPI Creatinine Equation (2021)    Anion gap 9 5 - 15    Comment: Performed at [facility], [address] [place], [place] [address]  CBC with Differential     Status: Abnormal   Collection Time: 04/08/23  3:15 PM  Result Value  Ref Range   WBC 7.6 4.0 - 10.5 K/uL   RBC 4.64 3.87 - 5.11 MIL/uL   Hemoglobin 8.9 (L) 12.0 - 15.0 g/dL    Comment: Reticulocyte Hemoglobin testing may be clinically indicated, consider ordering this additional test [id number]    HCT 31.1 (L) 36.0 - 46.0 %   MCV 67.0 (L) 80.0 - 100.0 fL   MCH 19.2 (L) 26.0 - 34.0 pg   MCHC 28.6 (L) 30.0 - 36.0 g/dL   RDW 41.3 (H) 24.4 - 01.0 %   Platelets 677 (H) 150 - 400 K/uL    Comment: REPEATED TO VERIFY   nRBC 0.0 0.0 - 0.2 %   Neutrophils Relative % 64 %   Neutro Abs 4.9 1.7 - 7.7 K/uL   Lymphocytes Relative 21 %   Lymphs Abs 1.6 0.7 - 4.0 K/uL   Monocytes Relative 9 %   Monocytes Absolute 0.7 0.1 - 1.0 K/uL   Eosinophils Relative 6 %   Eosinophils Absolute 0.5 0.0 - 0.5 K/uL   Basophils Relative 0 %   Basophils Absolute 0.0 0.0 - 0.1 K/uL   Immature Granulocytes 0 %   Abs Immature Granulocytes 0.02 0.00 - 0.07 K/uL    Comment: Performed at [facility], [address] [place],  [place] [address]  Uric acid     Status: None   Collection Time: 04/08/23  3:15 PM  Result Value Ref Range   Uric Acid, Serum 5.3 2.5 - 7.1 mg/dL    Comment: Performed at [facility], [address] [place], [place] [address]  Sedimentation rate     Status: Abnormal   Collection Time: 04/08/23  3:15 PM  Result Value Ref Range   Sed Rate 100 (H) 0 - 22 mm/hr    Comment: Performed at [facility], [address] [place], [place] [address]  Body fluid culture w Gram Stain     Status: None (Preliminary result)   Collection Time: 04/08/23  6:34 PM   Specimen: KNEE; Body Fluid  Result Value Ref Range   Specimen Description      KNEE Performed at [facility], [address] [place], [place] [address]    Special Requests      NONE Performed at [facility], [address] [place], [place] [address]    Gram Stain      ABUNDANT WBC PRESENT, PREDOMINANTLY PMN RARE GRAM POSITIVE COCCI IN PAIRS IN  CHAINS Gram Stain Report Called to,Read Back By and Verified With: RN . A. Normand Sloop [id number] @ 2236 FH Performed at [facility], [address], [place], [place] [address]    Culture PENDING    Report Status PENDING   Synovial cell count + diff, w/ crystals     Status: Abnormal   Collection Time: 04/08/23  6:34 PM  Result Value Ref Range   Color, Synovial RED (A) YELLOW   Appearance-Synovial TURBID (A) CLEAR   Crystals, Fluid NO CRYSTALS SEEN    WBC, Synovial UNABLE TO PERFORM COUNT DUE TO CLOT IN SPECIMEN 0 - 200 /cu mm   Neutrophil, Synovial 96 (H) 0 - 25 %   Lymphocytes-Synovial Fld 1 0 - 20 %   Monocyte-Macrophage-Synovial Fluid 3 (L) 50 - 90 %    Comment: Performed at [facility], [address], [place], [place] [address]  HIV Antibody (routine testing w rflx)     Status: None   Collection Time: 04/09/23  6:24 AM  Result Value Ref Range   HIV Screen 4th Generation wRfx Non Reactive Non Reactive    Comment: Performed at [facility], [address] [place], [place] [address]  Comprehensive metabolic panel     Status: Abnormal   Collection Time: 04/09/23  6:24 AM  Result Value Ref Range   Sodium 139 135 - 145 mmol/L   Potassium 3.9 3.5 - 5.1 mmol/L   Chloride 106 98 - 111 mmol/L   CO2 24 22 - 32 mmol/L   Glucose, Bld 94 70 - 99 mg/dL    Comment: Glucose reference range applies only to samples taken after fasting for at least 8 hours.   BUN 12 6 - 20 mg/dL   Creatinine, Ser 4.40 0.44 - 1.00 mg/dL   Calcium 8.8 (L) 8.9 - 10.3 mg/dL   Total Protein 7.0 6.5 - 8.1 g/dL   Albumin 2.8 (L) 3.5 - 5.0 g/dL   AST 13 (L) 15 - 41 U/L   ALT 10 0 - 44 U/L   Alkaline Phosphatase 65 38 - 126 U/L   Total Bilirubin 0.3 0.3 - 1.2 mg/dL   GFR, Estimated >10 >27 mL/min    Comment: (NOTE) Calculated using the CKD-EPI Creatinine Equation (2021)  Anion gap 9 5 - 15    Comment: Performed at [facility], [address] [place], [place] [address]  CBC     Status:  Abnormal   Collection Time: 04/09/23  6:24 AM  Result Value Ref Range   WBC 6.6 4.0 - 10.5 K/uL   RBC 4.37 3.87 - 5.11 MIL/uL   Hemoglobin 8.3 (L) 12.0 - 15.0 g/dL    Comment: Reticulocyte Hemoglobin testing may be clinically indicated, consider ordering this additional test [id number]    HCT 29.3 (L) 36.0 - 46.0 %   MCV 67.0 (L) 80.0 - 100.0 fL   MCH 19.0 (L) 26.0 - 34.0 pg   MCHC 28.3 (L) 30.0 - 36.0 g/dL   RDW 11.9 (H) 14.7 - 82.9 %   Platelets 594 (H) 150 - 400 K/uL    Comment: REPEATED TO VERIFY   nRBC 0.0 0.0 - 0.2 %    Comment: Performed at [facility], [address] [place], [place] [address]  hCG, serum, qualitative     Status: None   Collection Time: 04/09/23  8:08 AM  Result Value Ref Range   Preg, Serum NEGATIVE NEGATIVE    Comment:        THE SENSITIVITY OF THIS METHODOLOGY IS >10 mIU/mL. Performed at [facility], [address], [place], [place] [address]     US Venous Img Lower Unilateral Right  Result Date: 04/08/2023 CLINICAL DATA:  Right lower extremity swelling EXAM: Right LOWER EXTREMITY VENOUS DOPPLER ULTRASOUND TECHNIQUE: Gray-scale sonography with compression, as well as color and duplex ultrasound, were performed to evaluate the deep venous system(s) from the level of the common femoral vein through the popliteal and proximal calf veins. COMPARISON:  None Available. FINDINGS: VENOUS Normal compressibility of the common femoral, superficial femoral, and popliteal veins, as well as the visualized calf veins. Visualized portions of profunda femoral vein and great saphenous vein unremarkable. No filling defects to suggest DVT on grayscale or color Doppler imaging. Doppler waveforms show normal direction of venous flow, normal respiratory plasticity and response to augmentation. Limited views of the contralateral common femoral vein are unremarkable. OTHER There is a oblong fluid collection noted anterior to the knee in the area of pain measuring 4.7  x 1.4 x 5.0 cm. No abnormal blood flow on color Doppler. Limitations: none IMPRESSION: No evidence of right lower extremity DVT. Fluid collection anterior to the right knee in the area of pain as per the sonographer measuring 4.7 x 1.4 x 5.0 cm. This a differential. Please correlate for any signs of infection or trauma. Further workup as clinically appropriate or follow-up. Electronically Signed   By: Karen Kays M.D.   On: 04/08/2023 17:07   DG Knee Complete 4 Views Right  Result Date: 04/08/2023 CLINICAL DATA:  Right knee pain for 1 month.  No known injury. EXAM: RIGHT KNEE - COMPLETE 4+ VIEW COMPARISON:  Radiographs 03/11/2023 and 08/07/2020 FINDINGS: Suboptimal positioning, especially on the lateral view. The images are not labeled as having been obtained erect. There is new medial compartment joint space narrowing compared with the prior study of 4 weeks ago, and this is associated with probable erosive changes peripherally in the medial femoral condyle and medial tibial plateau. The lateral and patellofemoral joint spaces appear preserved. Persistent moderate to large knee joint effusion, similar to the recent prior study. No evidence of foreign body or soft tissue emphysema. IMPRESSION: New medial compartment joint space narrowing with probable erosive changes  peripherally in the medial femoral condyle and medial tibial plateau. Persistent moderate to large knee joint effusion. Findings are suspicious for septic arthritis. Correlate clinically and consider further evaluation with MRI and arthrocentesis. Electronically Signed   By: Carey Bullocks M.D.   On: 04/08/2023 16:21    Review of Systems  HENT:  Negative for ear discharge, ear pain, hearing loss and tinnitus.   Eyes:  Negative for photophobia and pain.  Respiratory:  Negative for cough and shortness of breath.   Cardiovascular:  Negative for chest pain.  Gastrointestinal:  Negative for abdominal pain, nausea and vomiting.  Genitourinary:   Negative for dysuria, flank pain, frequency and urgency.  Musculoskeletal:  Positive for arthralgias (Right knee). Negative for back pain, myalgias and neck pain.  Neurological:  Negative for dizziness and headaches.  Hematological:  Does not bruise/bleed easily.  Psychiatric/Behavioral:  The patient is not nervous/anxious.    Blood pressure (!) 130/91, pulse 77, temperature 98.3 F (36.8 C), resp. rate 19, height 5\' 4"  (1.626 m), weight 113.4 kg, last menstrual period 03/09/2023, SpO2 97%. Physical Exam Constitutional:      General: She is not in acute distress.    Appearance: She is well-developed. She is not diaphoretic.  HENT:     Head: Normocephalic and atraumatic.  Eyes:     General: No scleral icterus.       Right eye: No discharge.        Left eye: No discharge.     Conjunctiva/sclera: Conjunctivae normal.  Cardiovascular:     Rate and Rhythm: Normal rate and regular rhythm.  Pulmonary:     Effort: Pulmonary effort is normal. No respiratory distress.  Musculoskeletal:     Cervical back: Normal range of motion.     Comments: RLE No traumatic wounds, ecchymosis, or rash  Severe pain knee, mod pain lateral/post thigh  No ankle effusion  Sens DPN, SPN, TN intact  Motor EHL, ext, flex, evers 5/5  DP 2+, PT 2+, 2+ NP edema  Skin:    General: Skin is warm and dry.  Neurological:     Mental Status: She is alert.  Psychiatric:        Mood and Affect: Mood normal.        Behavior: Behavior normal.     Assessment/Plan: Right knee pain -- Plan I&D today with Dr. Eulah Pont. Please keep NPO.    Freeman Caldron, [name] Orthopedic Surgery [phone] 04/09/2023, 10:21 AM

## 2023-04-09 NOTE — Anesthesia Procedure Notes (Signed)
Procedure Name: LMA Insertion Date/Time: 04/09/2023 3:12 PM  Performed by: Alwyn Ren, CRNAPre-anesthesia Checklist: Patient identified, Emergency Drugs available, Timeout performed, Patient being monitored and Suction available Patient Re-evaluated:Patient Re-evaluated prior to induction Oxygen Delivery Method: Circle system utilized Preoxygenation: Pre-oxygenation with 100% oxygen Induction Type: IV induction LMA Size: 4.0 Number of attempts: 1

## 2023-04-09 NOTE — Interval H&P Note (Signed)
History and Physical Interval Note:  04/09/2023 3:02 PM  Sharon Faulkner  has presented today for surgery, with the diagnosis of RIGHT SEPTIC KNEE.  The various methods of treatment have been discussed with the patient and family. After consideration of risks, benefits and other options for treatment, the patient has consented to  Procedure(s): ARTHROSCOPY KNEE RIGHT (Right) as a surgical intervention.  The patient's history has been reviewed, patient examined, no change in status, stable for surgery.  I have reviewed the patient's chart and labs.  Questions were answered to the patient's satisfaction.     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 Lajoyce Corners discussed need for bone resection but will start with initial washout and IV antibiotics and culture

## 2023-04-09 NOTE — Consult Note (Signed)
Reason for Consult:Right knee pain Referring Physician: Pamella Pert Time called: 0904 Time at bedside: 1011   Sharon Faulkner is an 47 y.o. female.  HPI: Sharon Faulkner developed right knee pain and swelling in late July. She was seen in the ED 7/31 where aspiration was unsuccessful and given steroids and pain meds. These did not help. She quickly progressed to being unable to bear weight and has been getting around in a [facility] since late July. She denies any prior issues with the knee.  Past Medical History:  Diagnosis Date   Asthma     History reviewed. No pertinent surgical history.  History reviewed. No pertinent family history.  Social History:  reports that she has been smoking cigarettes. She has never used smokeless tobacco. She reports that she does not use drugs. No history on file for alcohol use.  Allergies:  Allergies  Allergen Reactions   Shellfish Allergy Anaphylaxis and Itching   Cocos Nucifera Hives   Codeine Itching   Mango Flavor Itching    Medications: I have reviewed the patient's current medications.  Results for orders placed or performed during the hospital encounter of 04/08/23 (from the past 48 hour(s))  Basic metabolic panel     Status: None   Collection Time: 04/08/23  3:15 PM  Result Value Ref Range   Sodium 139 135 - 145 mmol/L   Potassium 4.0 3.5 - 5.1 mmol/L   Chloride 105 98 - 111 mmol/L   CO2 25 22 - 32 mmol/L   Glucose, Bld 91 70 - 99 mg/dL    Comment: Glucose reference range applies only to samples taken after fasting for at least 8 hours.   BUN 16 6 - 20 mg/dL   Creatinine, Ser 8.29 0.44 - 1.00 mg/dL   Calcium 9.0 8.9 - 56.2 mg/dL   GFR, Estimated >13 >08 mL/min    Comment: (NOTE) Calculated using the CKD-EPI Creatinine Equation (2021)    Anion gap 9 5 - 15    Comment: Performed at [facility], [address] [place], [place] [address]  CBC with Differential     Status: Abnormal   Collection Time: 04/08/23  3:15 PM  Result Value  Ref Range   WBC 7.6 4.0 - 10.5 K/uL   RBC 4.64 3.87 - 5.11 MIL/uL   Hemoglobin 8.9 (L) 12.0 - 15.0 g/dL    Comment: Reticulocyte Hemoglobin testing may be clinically indicated, consider ordering this additional test [id number]    HCT 31.1 (L) 36.0 - 46.0 %   MCV 67.0 (L) 80.0 - 100.0 fL   MCH 19.2 (L) 26.0 - 34.0 pg   MCHC 28.6 (L) 30.0 - 36.0 g/dL   RDW 41.3 (H) 24.4 - 01.0 %   Platelets 677 (H) 150 - 400 K/uL    Comment: REPEATED TO VERIFY   nRBC 0.0 0.0 - 0.2 %   Neutrophils Relative % 64 %   Neutro Abs 4.9 1.7 - 7.7 K/uL   Lymphocytes Relative 21 %   Lymphs Abs 1.6 0.7 - 4.0 K/uL   Monocytes Relative 9 %   Monocytes Absolute 0.7 0.1 - 1.0 K/uL   Eosinophils Relative 6 %   Eosinophils Absolute 0.5 0.0 - 0.5 K/uL   Basophils Relative 0 %   Basophils Absolute 0.0 0.0 - 0.1 K/uL   Immature Granulocytes 0 %   Abs Immature Granulocytes 0.02 0.00 - 0.07 K/uL    Comment: Performed at [facility], [address] [place],  [place] [address]  Uric acid     Status: None   Collection Time: 04/08/23  3:15 PM  Result Value Ref Range   Uric Acid, Serum 5.3 2.5 - 7.1 mg/dL    Comment: Performed at [facility], [address] [place], [place] [address]  Sedimentation rate     Status: Abnormal   Collection Time: 04/08/23  3:15 PM  Result Value Ref Range   Sed Rate 100 (H) 0 - 22 mm/hr    Comment: Performed at [facility], [address] [place], [place] [address]  Body fluid culture w Gram Stain     Status: None (Preliminary result)   Collection Time: 04/08/23  6:34 PM   Specimen: KNEE; Body Fluid  Result Value Ref Range   Specimen Description      KNEE Performed at [facility], [address] [place], [place] [address]    Special Requests      NONE Performed at [facility], [address] [place], [place] [address]    Gram Stain      ABUNDANT WBC PRESENT, PREDOMINANTLY PMN RARE GRAM POSITIVE COCCI IN PAIRS IN  CHAINS Gram Stain Report Called to,Read Back By and Verified With: RN . A. Normand Sloop [id number] @ 2236 FH Performed at [facility], [address], [place], [place] [address]    Culture PENDING    Report Status PENDING   Synovial cell count + diff, w/ crystals     Status: Abnormal   Collection Time: 04/08/23  6:34 PM  Result Value Ref Range   Color, Synovial RED (A) YELLOW   Appearance-Synovial TURBID (A) CLEAR   Crystals, Fluid NO CRYSTALS SEEN    WBC, Synovial UNABLE TO PERFORM COUNT DUE TO CLOT IN SPECIMEN 0 - 200 /cu mm   Neutrophil, Synovial 96 (H) 0 - 25 %   Lymphocytes-Synovial Fld 1 0 - 20 %   Monocyte-Macrophage-Synovial Fluid 3 (L) 50 - 90 %    Comment: Performed at [facility], [address], [place], [place] [address]  HIV Antibody (routine testing w rflx)     Status: None   Collection Time: 04/09/23  6:24 AM  Result Value Ref Range   HIV Screen 4th Generation wRfx Non Reactive Non Reactive    Comment: Performed at [facility], [address] [place], [place] [address]  Comprehensive metabolic panel     Status: Abnormal   Collection Time: 04/09/23  6:24 AM  Result Value Ref Range   Sodium 139 135 - 145 mmol/L   Potassium 3.9 3.5 - 5.1 mmol/L   Chloride 106 98 - 111 mmol/L   CO2 24 22 - 32 mmol/L   Glucose, Bld 94 70 - 99 mg/dL    Comment: Glucose reference range applies only to samples taken after fasting for at least 8 hours.   BUN 12 6 - 20 mg/dL   Creatinine, Ser 4.40 0.44 - 1.00 mg/dL   Calcium 8.8 (L) 8.9 - 10.3 mg/dL   Total Protein 7.0 6.5 - 8.1 g/dL   Albumin 2.8 (L) 3.5 - 5.0 g/dL   AST 13 (L) 15 - 41 U/L   ALT 10 0 - 44 U/L   Alkaline Phosphatase 65 38 - 126 U/L   Total Bilirubin 0.3 0.3 - 1.2 mg/dL   GFR, Estimated >10 >27 mL/min    Comment: (NOTE) Calculated using the CKD-EPI Creatinine Equation (2021)  Anion gap 9 5 - 15    Comment: Performed at [facility], [address] [place], [place] [address]  CBC     Status:  Abnormal   Collection Time: 04/09/23  6:24 AM  Result Value Ref Range   WBC 6.6 4.0 - 10.5 K/uL   RBC 4.37 3.87 - 5.11 MIL/uL   Hemoglobin 8.3 (L) 12.0 - 15.0 g/dL    Comment: Reticulocyte Hemoglobin testing may be clinically indicated, consider ordering this additional test [id number]    HCT 29.3 (L) 36.0 - 46.0 %   MCV 67.0 (L) 80.0 - 100.0 fL   MCH 19.0 (L) 26.0 - 34.0 pg   MCHC 28.3 (L) 30.0 - 36.0 g/dL   RDW 11.9 (H) 14.7 - 82.9 %   Platelets 594 (H) 150 - 400 K/uL    Comment: REPEATED TO VERIFY   nRBC 0.0 0.0 - 0.2 %    Comment: Performed at [facility], [address] [place], [place] [address]  hCG, serum, qualitative     Status: None   Collection Time: 04/09/23  8:08 AM  Result Value Ref Range   Preg, Serum NEGATIVE NEGATIVE    Comment:        THE SENSITIVITY OF THIS METHODOLOGY IS >10 mIU/mL. Performed at [facility], [address], [place], [place] [address]     US Venous Img Lower Unilateral Right  Result Date: 04/08/2023 CLINICAL DATA:  Right lower extremity swelling EXAM: Right LOWER EXTREMITY VENOUS DOPPLER ULTRASOUND TECHNIQUE: Gray-scale sonography with compression, as well as color and duplex ultrasound, were performed to evaluate the deep venous system(s) from the level of the common femoral vein through the popliteal and proximal calf veins. COMPARISON:  None Available. FINDINGS: VENOUS Normal compressibility of the common femoral, superficial femoral, and popliteal veins, as well as the visualized calf veins. Visualized portions of profunda femoral vein and great saphenous vein unremarkable. No filling defects to suggest DVT on grayscale or color Doppler imaging. Doppler waveforms show normal direction of venous flow, normal respiratory plasticity and response to augmentation. Limited views of the contralateral common femoral vein are unremarkable. OTHER There is a oblong fluid collection noted anterior to the knee in the area of pain measuring 4.7  x 1.4 x 5.0 cm. No abnormal blood flow on color Doppler. Limitations: none IMPRESSION: No evidence of right lower extremity DVT. Fluid collection anterior to the right knee in the area of pain as per the sonographer measuring 4.7 x 1.4 x 5.0 cm. This a differential. Please correlate for any signs of infection or trauma. Further workup as clinically appropriate or follow-up. Electronically Signed   By: Karen Kays M.D.   On: 04/08/2023 17:07   DG Knee Complete 4 Views Right  Result Date: 04/08/2023 CLINICAL DATA:  Right knee pain for 1 month.  No known injury. EXAM: RIGHT KNEE - COMPLETE 4+ VIEW COMPARISON:  Radiographs 03/11/2023 and 08/07/2020 FINDINGS: Suboptimal positioning, especially on the lateral view. The images are not labeled as having been obtained erect. There is new medial compartment joint space narrowing compared with the prior study of 4 weeks ago, and this is associated with probable erosive changes peripherally in the medial femoral condyle and medial tibial plateau. The lateral and patellofemoral joint spaces appear preserved. Persistent moderate to large knee joint effusion, similar to the recent prior study. No evidence of foreign body or soft tissue emphysema. IMPRESSION: New medial compartment joint space narrowing with probable erosive changes  peripherally in the medial femoral condyle and medial tibial plateau. Persistent moderate to large knee joint effusion. Findings are suspicious for septic arthritis. Correlate clinically and consider further evaluation with MRI and arthrocentesis. Electronically Signed   By: Carey Bullocks M.D.   On: 04/08/2023 16:21    Review of Systems  HENT:  Negative for ear discharge, ear pain, hearing loss and tinnitus.   Eyes:  Negative for photophobia and pain.  Respiratory:  Negative for cough and shortness of breath.   Cardiovascular:  Negative for chest pain.  Gastrointestinal:  Negative for abdominal pain, nausea and vomiting.  Genitourinary:   Negative for dysuria, flank pain, frequency and urgency.  Musculoskeletal:  Positive for arthralgias (Right knee). Negative for back pain, myalgias and neck pain.  Neurological:  Negative for dizziness and headaches.  Hematological:  Does not bruise/bleed easily.  Psychiatric/Behavioral:  The patient is not nervous/anxious.    Blood pressure (!) 130/91, pulse 77, temperature 98.3 F (36.8 C), resp. rate 19, height 5\' 4"  (1.626 m), weight 113.4 kg, last menstrual period 03/09/2023, SpO2 97%. Physical Exam Constitutional:      General: She is not in acute distress.    Appearance: She is well-developed. She is not diaphoretic.  HENT:     Head: Normocephalic and atraumatic.  Eyes:     General: No scleral icterus.       Right eye: No discharge.        Left eye: No discharge.     Conjunctiva/sclera: Conjunctivae normal.  Cardiovascular:     Rate and Rhythm: Normal rate and regular rhythm.  Pulmonary:     Effort: Pulmonary effort is normal. No respiratory distress.  Musculoskeletal:     Cervical back: Normal range of motion.     Comments: RLE No traumatic wounds, ecchymosis, or rash  Severe pain knee, mod pain lateral/post thigh  No ankle effusion  Sens DPN, SPN, TN intact  Motor EHL, ext, flex, evers 5/5  DP 2+, PT 2+, 2+ NP edema  Skin:    General: Skin is warm and dry.  Neurological:     Mental Status: She is alert.  Psychiatric:        Mood and Affect: Mood normal.        Behavior: Behavior normal.     Assessment/Plan: Right knee pain -- Plan I&D today with Dr. Eulah Pont. Please keep NPO.    Freeman Caldron, [name] Orthopedic Surgery [phone] 04/09/2023, 10:21 AM

## 2023-04-10 ENCOUNTER — Encounter (HOSPITAL_COMMUNITY): Payer: Self-pay | Admitting: Orthopedic Surgery

## 2023-04-10 LAB — CBC
HCT: 25.6 % — ABNORMAL LOW (ref 36.0–46.0)
Hemoglobin: 7.2 g/dL — ABNORMAL LOW (ref 12.0–15.0)
MCH: 18.9 pg — ABNORMAL LOW (ref 26.0–34.0)
MCHC: 28.1 g/dL — ABNORMAL LOW (ref 30.0–36.0)
MCV: 67.4 fL — ABNORMAL LOW (ref 80.0–100.0)
Platelets: 565 10*3/uL — ABNORMAL HIGH (ref 150–400)
RBC: 3.8 MIL/uL — ABNORMAL LOW (ref 3.87–5.11)
RDW: 18.8 % — ABNORMAL HIGH (ref 11.5–15.5)
WBC: 9.3 10*3/uL (ref 4.0–10.5)
nRBC: 0 % (ref 0.0–0.2)

## 2023-04-10 LAB — COMPREHENSIVE METABOLIC PANEL
ALT: 10 U/L (ref 0–44)
AST: 14 U/L — ABNORMAL LOW (ref 15–41)
Albumin: 2.5 g/dL — ABNORMAL LOW (ref 3.5–5.0)
Alkaline Phosphatase: 61 U/L (ref 38–126)
Anion gap: 9 (ref 5–15)
BUN: 11 mg/dL (ref 6–20)
CO2: 25 mmol/L (ref 22–32)
Calcium: 8.9 mg/dL (ref 8.9–10.3)
Chloride: 103 mmol/L (ref 98–111)
Creatinine, Ser: 0.64 mg/dL (ref 0.44–1.00)
GFR, Estimated: >60 mL/min (ref >60)
Glucose, Bld: 127 mg/dL — ABNORMAL HIGH (ref 70–99)
Potassium: 4.1 mmol/L (ref 3.5–5.1)
Sodium: 137 mmol/L (ref 135–145)
Total Bilirubin: 0.5 mg/dL (ref 0.3–1.2)
Total Protein: 6.8 g/dL (ref 6.5–8.1)

## 2023-04-10 LAB — BODY FLUID CULTURE W GRAM STAIN

## 2023-04-10 LAB — MAGNESIUM: Magnesium: 1.7 mg/dL (ref 1.7–2.4)

## 2023-04-10 MED ORDER — OXYCODONE-ACETAMINOPHEN 5-325 MG PO TABS
1.0000 | ORAL_TABLET | Freq: Four times a day (QID) | ORAL | Status: DC | PRN
  Administered 2023-04-10 – 2023-04-14 (×9): 2 via ORAL
  Filled 2023-04-10 (×10): qty 2

## 2023-04-10 MED ORDER — PENICILLIN G POTASSIUM 20000000 UNITS IJ SOLR
12.0000 10*6.[IU] | Freq: Two times a day (BID) | INTRAVENOUS | Status: DC
  Administered 2023-04-10 – 2023-04-11 (×3): 12 10*6.[IU] via INTRAVENOUS
  Filled 2023-04-10 (×5): qty 12
  Filled 2023-04-10: qty 8.5

## 2023-04-10 NOTE — Progress Notes (Signed)
Subjective: Patient reports pain as marked but better than it was prior to surgery. Before every movement was painful.  Tolerating diet.  Urinating.   No CP, SOB.  Tried to mobilized OOB with PT but very limited by pain and tolerance to R leg movement. Has been trying to do some exercises on her own while laying in the bed. Understandably sad about needing more surgery.   Objective:   VITALS:   Vitals:   04/09/23 2029 04/10/23 0016 04/10/23 0458 04/10/23 0756  BP: (!) 140/92 (!) 153/94 (!) 148/90 (!) 155/94  Pulse: 87 84 79 91  Resp: 18 18 18 18   Temp: 98.5 F (36.9 C) 98.8 F (37.1 C) 98.4 F (36.9 C) 98.8 F (37.1 C)  TempSrc: Oral Oral Oral Oral  SpO2: 93% 98% 97% 93%  Weight:      Height:          Latest Ref Rng & Units 04/10/2023    6:32 AM 04/09/2023    6:24 AM 04/08/2023    3:15 PM  CBC  WBC 4.0 - 10.5 K/uL 9.3  6.6  7.6   Hemoglobin 12.0 - 15.0 g/dL 7.2  8.3  8.9   Hematocrit 36.0 - 46.0 % 25.6  29.3  31.1   Platelets 150 - 400 K/uL 565  594  677       Latest Ref Rng & Units 04/10/2023    6:32 AM 04/09/2023    6:24 AM 04/08/2023    3:15 PM  BMP  Glucose 70 - 99 mg/dL 161  94  91   BUN 6 - 20 mg/dL 11  12  16    Creatinine 0.44 - 1.00 mg/dL 0.96  0.45  4.09   Sodium 135 - 145 mmol/L 137  139  139   Potassium 3.5 - 5.1 mmol/L 4.1  3.9  4.0   Chloride 98 - 111 mmol/L 103  106  105   CO2 22 - 32 mmol/L 25  24  25    Calcium 8.9 - 10.3 mg/dL 8.9  8.8  9.0    Intake/Output      08/29 0701 08/30 0700 08/30 0701 08/31 0700   P.O. 0 240   I.V. (mL/kg) 600 (4.9)    IV Piggyback     Total Intake(mL/kg) 600 (4.9) 240 (2)   Urine (mL/kg/hr) 800 (0.3)    Blood 5    Total Output 805    Net -205 +240           Physical Exam: General: NAD.  Laying in bed, calm, comfortable, R leg elevated on pillows Resp: No increased wob Cardio: regular rate and rhythm ABD soft Neurologically intact MSK Neurovascularly intact Sensation intact distally Intact pulses  distally Dorsiflexion/Plantar flexion intact Did not attempt R knee ROM Incision: dressing C/D/I   Assessment: 1 Day Post-Op  S/P Procedure(s) (LRB): ARTHROSCOPY KNEE RIGHT (Right) by Dr. Jewel Baize. Eulah Pont on 04/09/23  Principal Problem:   Septic arthritis of knee, right (HCC) Active Problems:   Asthma   Chronic anemia   Arthritis, septic, knee (HCC)   Plan: Unfortunately patient has extensive osteomyelitis and infected tissue in the knee joint that has led to irreversible damage and almost complete cartilage degeneration. It will continue to worsen if left in place. She will need a 2 stage total knee arthroplasty with antibiotic components placed first by Dr. Eulah Pont and followed later by removal of that and placement of final implant components by Dr. Blanchie Dessert. She understands this.  Advance diet Up with therapy as able Incentive Spirometry Elevate and Apply ice  Weightbearing: WBAT RLE Insicional and dressing care: Reinforce dressings as needed Orthopedic device(s): None Showering: Keep dressing dry VTE prophylaxis: Lovenox 40mg  qd , SCDs, ambulation Pain control: PRN Follow - up plan:  TBD Contact information:  [name], [facility] [name]    Jenne Pane, [place] Office [phone] 04/10/2023, 10:41 AM

## 2023-04-10 NOTE — Anesthesia Postprocedure Evaluation (Signed)
Anesthesia Post Note  Patient: Sharon Faulkner  Procedure(s) Performed: ARTHROSCOPY KNEE RIGHT (Right: Knee)     Patient location during evaluation: PACU Anesthesia Type: General Level of consciousness: awake and alert Pain management: pain level controlled Vital Signs Assessment: post-procedure vital signs reviewed and stable Respiratory status: spontaneous breathing, nonlabored ventilation, respiratory function stable and patient connected to nasal cannula oxygen Cardiovascular status: blood pressure returned to baseline and stable Postop Assessment: no apparent nausea or vomiting Anesthetic complications: no   No notable events documented.  Last Vitals:  Vitals:   04/10/23 0458 04/10/23 0756  BP: (!) 148/90 (!) 155/94  Pulse: 79 91  Resp: 18 18  Temp: 36.9 C 37.1 C  SpO2: 97% 93%    Last Pain:  Vitals:   04/10/23 1000  TempSrc:   PainSc: 5                  Reade Trefz

## 2023-04-10 NOTE — Evaluation (Signed)
Physical Therapy Evaluation Patient Details Name: Sharon Faulkner MRN: [id number] DOB: 1976-05-05 Today's Date: 04/10/2023  History of Present Illness  47 yo female, presents to [facility] ED 8/28 with R knee and leg pain and swelling that has been present since 7/28. Prior ED visit 7/31 Xray clear of fx but showed Effusion. Unable to draw any fluid then 8/28 R knee aspirated with small volume of purulent fluid attained. Pt transferred to [facility] S/p 8/29 R knee arthroscopic I&D which revealed chronic infection and component of osteomyelitis  PMH: Asthma and PE not on anticoagulation.  Clinical Impression  PTA pt living in handicapped accessible hotel room with her mother who is blind and has a BKA. Pt reports that for the last month she and her mother have been sharing her mother's wheelchair because she has not been able to bear weight on her R LE. Pt was able to perform ADLs, family and [employer] provided groceries. Pt is currently limited in safe mobility by 10/10 R knee pain with movement. Pt able to come to long sitting in bed and move L LE without assist but even with UE support she is not able to tolerate moving her RLE to sit on EoB.  Pt will benefit from HHPT at discharge. PT will continue to follow acutely       If plan is discharge home, recommend the following: Two people to help with walking and/or transfers;Two people to help with bathing/dressing/bathroom;Assistance with cooking/housework;Assist for transportation   Can travel by Doctor, hospital cushion (measurements PT);Wheelchair (measurements PT);BSC/3in1  Recommendations for Other Services  OT consult    Functional Status Assessment Patient has had a recent decline in their functional status and demonstrates the ability to make significant improvements in function in a reasonable and predictable amount of time.     Precautions / Restrictions Precautions Precautions:  Fall Restrictions Weight Bearing Restrictions: No Other Position/Activity Restrictions: KI on out of bed      Mobility  Bed Mobility Overal bed mobility: Needs Assistance Bed Mobility: Supine to Sit     Supine to sit: Modified independent (Device/Increase time)     General bed mobility comments: with use of bed rails able to come to long sitting in bed, pt attempts to move R LE by UE to elevate LE by sock, but only able to move about 10 inches, unable to move further due to pain, repostioned in bed for more neutral hip and knee positioning,          Pertinent Vitals/Pain Pain Assessment Pain Assessment: 0-10 Pain Score: 5  Pain Location: L knee Pain Descriptors / Indicators: Grimacing, Guarding, Throbbing, Pins and needles, Aching Pain Intervention(s): Limited activity within patient's tolerance, Monitored during session, Repositioned    Home Living Family/patient expects to be discharged to:: Other (Comment) ([place]) Living Arrangements: Parent (care giver for her blind  LBKA mother) Available Help at Discharge: Family;Available PRN/intermittently Type of Home: Other(Comment) (Handicap Access Hotel Room)           Home Equipment: Wheelchair - manual Additional Comments: sharing wheelchair with her    Prior Function Prior Level of Function : Needs assist             Mobility Comments: transferring to mother's wheelchair for mobilization in room ADLs Comments: uses [employer], family for shopping, independent with bathing and dressing     Extremity/Trunk Assessment   Upper Extremity Assessment Upper Extremity Assessment: Overall [place] for  tasks assessed    Lower Extremity Assessment Lower Extremity Assessment: RLE deficits/detail RLE Deficits / Details: R knee limited by 10/10 pain at rest lacks about 20 degrees of knee extensionR ankle ROM limited by edema RLE: Unable to fully assess due to pain RLE Sensation: decreased light touch    Cervical / Trunk  Assessment Cervical / Trunk Assessment: Normal  Communication   Communication Communication: No apparent difficulties  Cognition Arousal: Alert Behavior During Therapy: WFL for tasks assessed/performed Overall Cognitive Status: Within Functional Limits for tasks assessed                                          General Comments General comments (skin integrity, edema, etc.): pt with increased edema in R foot positioned R LE with increased elevation    Exercises     Assessment/Plan    PT Assessment Patient needs continued PT services  PT Problem List Decreased range of motion;Decreased activity tolerance;Decreased balance;Decreased mobility;Decreased coordination;Pain       PT Treatment Interventions DME instruction;Gait training;Functional mobility training;Therapeutic activities;Therapeutic exercise;Balance training;Cognitive remediation;Patient/family education    PT Goals (Current goals can be found in the Care Plan section)  Acute Rehab PT Goals PT Goal Formulation: With patient Time For Goal Achievement: 04/24/23 Potential to Achieve Goals: Fair    Frequency Min 1X/week        AM-PAC PT "6 Clicks" Mobility  Outcome Measure Help needed turning from your back to your side while in a flat bed without using bedrails?: A Lot Help needed moving from lying on your back to sitting on the side of a flat bed without using bedrails?: Total Help needed moving to and from a bed to a chair (including a wheelchair)?: Total Help needed standing up from a chair using your arms (e.g., wheelchair or bedside chair)?: Total Help needed to walk in hospital room?: Total Help needed climbing 3-5 steps with a railing? : Total 6 Click Score: 7    End of Session   Activity Tolerance: Patient limited by pain Patient left: in bed;with call bell/phone within reach;with bed alarm set Nurse Communication: Mobility status;Patient requests pain meds PT Visit Diagnosis:  Unsteadiness on feet (R26.81);Other abnormalities of gait and mobility (R26.89);Muscle weakness (generalized) (M62.81);Difficulty in walking, not elsewhere classified (R26.2);Pain    Time: 7253-6644 PT Time Calculation (min) (ACUTE ONLY): 43 min   Charges:   PT Evaluation $PT Eval Moderate Complexity: 1 Mod PT Treatments $Therapeutic Activity: 8-22 mins PT General Charges $$ ACUTE PT VISIT: 1 Visit         Bartosz Luginbill B. Beverely Risen PT, DPT Acute Rehabilitation Services Please use secure chat or  Call Office [phone]   [facility]on Alas [facility] 04/10/2023, 10:41 AM

## 2023-04-10 NOTE — Progress Notes (Signed)
ID brief note   Results for orders placed or performed during the hospital encounter of 04/08/23  Body fluid culture w Gram Stain     Status: None   Collection Time: 04/08/23  6:34 PM   Specimen: KNEE; Body Fluid  Result Value Ref Range Status   Specimen Description   Final    KNEE Performed at [facility], [address] [place], [place] [address]    Special Requests   Final    NONE Performed at [facility], [address] [place], [place] [address]    Gram Stain   Final    ABUNDANT WBC PRESENT, PREDOMINANTLY PMN RARE GRAM POSITIVE COCCI IN PAIRS IN CHAINS Gram Stain Report Called to,Read Back By and Verified With: RN . A. Normand Sloop [id number] @ 2236 FH    Culture   Final    FEW GROUP B STREP(S.AGALACTIAE)ISOLATED TESTING AGAINST S. AGALACTIAE NOT ROUTINELY PERFORMED DUE TO PREDICTABILITY OF AMP/PEN/VAN SUSCEPTIBILITY. Performed at [facility], [address] [place], [place] [address]    Report Status 04/10/2023 FINAL  Final  Culture, blood (Routine X 2) w Reflex to ID Panel     Status: None (Preliminary result)   Collection Time: 04/09/23  6:19 AM   Specimen: BLOOD RIGHT HAND  Result Value Ref Range Status   Specimen Description BLOOD RIGHT HAND  Final   Special Requests   Final    BOTTLES DRAWN AEROBIC ONLY Blood Culture results may not be optimal due to an inadequate volume of blood received in culture bottles   Culture   Final    NO GROWTH 1 DAY Performed at [facility], [address], [place], [place] [address]    Report Status PENDING  Incomplete  Culture, blood (Routine X 2) w Reflex to ID Panel     Status: None (Preliminary result)   Collection Time: 04/09/23  8:06 AM   Specimen: BLOOD  Result Value Ref Range Status   Specimen Description BLOOD SITE NOT SPECIFIED  Final   Special Requests   Final    BOTTLES DRAWN AEROBIC AND ANAEROBIC Blood Culture results may not be optimal due to an inadequate volume of blood received in  culture bottles   Culture   Final    NO GROWTH < 24 HOURS Performed at [facility], [address] [place], [place] [address]    Report Status PENDING  Incomplete  Aerobic/Anaerobic Culture w Gram Stain (surgical/deep wound)     Status: None (Preliminary result)   Collection Time: 04/09/23  3:28 PM   Specimen: Abscess  Result Value Ref Range Status   Specimen Description ABSCESS  Final   Special Requests right knee  Final   Gram Stain   Final    ABUNDANT WBC PRESENT, PREDOMINANTLY PMN NO ORGANISMS SEEN    Culture   Final    MODERATE GROUP B STREP(S.AGALACTIAE)ISOLATED TESTING AGAINST S. AGALACTIAE NOT ROUTINELY PERFORMED DUE TO PREDICTABILITY OF AMP/PEN/VAN SUSCEPTIBILITY. Performed at [facility], [address], [place], [place] [address]    Report Status PENDING  Incomplete   Will switch IV abtx to high dose penicillin G continuous infusion Needs PICC and 6 weeks of IV abtx Monitor CBC and BMP on abtx Noted patient will need 2 stage arthroplasty due to extensive osteomyelitis, unclear timing in terms of this admission vs at a later date  Dr Drue Second covering until Monday  Odette Fraction, MD Infectious Disease Physician  [facility] [address] [place], [place] [address] Phone: [phone]  Fax: [phone]

## 2023-04-10 NOTE — Plan of Care (Signed)

## 2023-04-10 NOTE — Progress Notes (Signed)
Pharmacy Antibiotic Note  Sharon Faulkner is a 47 y.o. female admitted on 04/08/2023 with R knee septic arthritis/OM s/p knee arthroscopy 8/30. Pharmacy has been consulted for penicillin G dosing for 6 weeks per ID. Planning stage 2 arthroplasty soon.   Plan: Stop vancomycin  Start penicillin 12 million units CI q12hr ( =24 mil units/day) End date 10/10   Height: 5\' 5"  (165.1 cm) Weight: 122.5 kg (270 lb) IBW/kg (Calculated) : 57  Temp (24hrs), Avg:98.6 F (37 C), Min:98.4 F (36.9 C), Max:98.8 F (37.1 C)  Recent Labs  Lab 04/08/23 1515 04/09/23 0624 04/10/23 0632  WBC 7.6 6.6 9.3  CREATININE 0.67 0.75 0.64    Estimated Creatinine Clearance: 113 mL/min (by C-G formula based on SCr of 0.64 mg/dL).    Allergies  Allergen Reactions   Shellfish Allergy Anaphylaxis and Itching   Cocos Nucifera Hives   Codeine Itching   Mango Flavor Itching    Antimicrobials this admission: 8/28 cefazolin 2g x1 8/29 vancomycin >>8/30 8/30 PCN>>   Dose adjustments this admission: none  Microbiology results: 8/29 BCx: ngtd  8/29 knee abscess: mod GBS 8/28 knee aspirate cx: abundant WBC, few GBS  Thank you for allowing pharmacy to be a part of this patient's care.  Alphia Moh, PharmD, BCPS, BCCP Clinical Pharmacist  Please check AMION for all [facility] Pharmacy phone numbers After 10:00 PM, call Main Pharmacy [phone]

## 2023-04-10 NOTE — Progress Notes (Signed)
PROGRESS NOTE  Lasharra Lundy UJW:[id number] DOB: 1975/09/17 DOA: 04/08/2023 PCP: Patient, No Pcp Per   LOS: 1 day   Brief Narrative / Interim history: 47 year old female with obesity, asthma comes into the hospital with knee swelling for the past several weeks. She denies any trauma to the knee, denies any injuries, bug bites, she denies any IV drug use, no recent surgeries. Arthrocentesis in the ED showed gram-positive cocci in chains, and orthopedic surgery was consulted and she was admitted to the hospital   Subjective / 24h Interval events: Complains of pain in her knee, with fair control with IV pain medications  Assesement and Plan: Principal Problem:   Septic arthritis of knee, right (HCC) Active Problems:   Asthma   Chronic anemia   Arthritis, septic, knee (HCC)   Principal problem Right knee septic arthritis -hemodynamically stable, this appears to be going on for several weeks.  Orthopedic surgery consulted, status post I&D in the OR on 8/29 by Dr. Eulah Pont -Awaiting cultures.  ID consulted as well   Active problems History of asthma -no wheezing, this is stable   Chronic anemia, acute blood loss anemia-continue iron supplement, hemoglobin likely lower today, monitor, transfuse for less than 7   Obesity, class III -BMI 42.9, she would benefit from weight loss  Scheduled Meds:  docusate sodium  100 mg Oral BID   enoxaparin (LOVENOX) injection  40 mg Subcutaneous Q24H   ferrous sulfate  325 mg Oral Daily   pantoprazole  40 mg Oral Daily   sodium chloride flush  3 mL Intravenous Q12H   Continuous Infusions:  sodium chloride     vancomycin 1,000 mg (04/10/23 0508)   PRN Meds:.sodium chloride, acetaminophen **OR** acetaminophen, albuterol, diphenhydrAMINE, hydrALAZINE, HYDROmorphone (DILAUDID) injection, methocarbamol, metoCLOPramide **OR** metoCLOPramide (REGLAN) injection, ondansetron **OR** ondansetron (ZOFRAN) IV, oxyCODONE-acetaminophen, senna-docusate, sodium  chloride flush  Current Outpatient Medications  Medication Instructions   albuterol (VENTOLIN HFA) 108 (90 Base) MCG/ACT inhaler 2 puffs, Every 6 hours PRN   diphenhydramine-acetaminophen (TYLENOL PM) 25-500 MG TABS tablet 1 tablet, Oral, At bedtime PRN   ferrous sulfate 325 mg, Oral, Daily   methocarbamol (ROBAXIN) 500 mg, Oral, Every 6 hours PRN   naproxen sodium (ALEVE) 220 mg, Oral, Daily PRN   oxyCODONE-acetaminophen (PERCOCET/ROXICET) 5-325 MG tablet 1 tablet, Oral, Every 6 hours PRN    Diet Orders (From admission, onward)     Start     Ordered   04/09/23 1717  Diet regular Room service appropriate? Yes; Fluid consistency: Thin  Diet effective now       Question Answer Comment  Room service appropriate? Yes   Fluid consistency: Thin      04/09/23 1716            DVT prophylaxis: SCDs Start: 04/09/23 1703 enoxaparin (LOVENOX) injection 40 mg Start: 04/09/23 1000 SCDs Start: 04/09/23 0146   Lab Results  Component Value Date   PLT 565 (H) 04/10/2023      Code Status: Full Code  Family Communication: no family at bedside   Status is: Inpatient Remains inpatient appropriate because: severity of illness  Level of care: Med-Surg  Consultants:  Orthopedic surgery  ID  Objective: Vitals:   04/09/23 2029 04/10/23 0016 04/10/23 0458 04/10/23 0756  BP: (!) 140/92 (!) 153/94 (!) 148/90 (!) 155/94  Pulse: 87 84 79 91  Resp: 18 18 18 18   Temp: 98.5 F (36.9 C) 98.8 F (37.1 C) 98.4 F (36.9 C) 98.8 F (37.1 C)  TempSrc: Oral Oral Oral  Oral  SpO2: 93% 98% 97% 93%  Weight:      Height:        Intake/Output Summary (Last 24 hours) at 04/10/2023 1027 Last data filed at 04/10/2023 0900 Gross per 24 hour  Intake 840 ml  Output 505 ml  Net 335 ml   Wt Readings from Last 3 Encounters:  04/09/23 122.5 kg  03/11/23 113.4 kg  08/07/20 113.9 kg    Examination:  Constitutional: NAD Eyes: no scleral icterus ENMT: Mucous membranes are moist.  Neck: normal,  supple Respiratory: clear to auscultation bilaterally, no wheezing, no crackles.  Cardiovascular: Regular rate and rhythm, no murmurs / rubs / gallops. No LE edema.  Abdomen: non distended, no tenderness. Bowel sounds positive.  Musculoskeletal: no clubbing / cyanosis.   Data Reviewed: I have independently reviewed following labs and imaging studies   CBC Recent Labs  Lab 04/08/23 1515 04/09/23 0624 04/10/23 0632  WBC 7.6 6.6 9.3  HGB 8.9* 8.3* 7.2*  HCT 31.1* 29.3* 25.6*  PLT 677* 594* 565*  MCV 67.0* 67.0* 67.4*  MCH 19.2* 19.0* 18.9*  MCHC 28.6* 28.3* 28.1*  RDW 19.1* 18.8* 18.8*  LYMPHSABS 1.6  --   --   MONOABS 0.7  --   --   EOSABS 0.5  --   --   BASOSABS 0.0  --   --     Recent Labs  Lab 04/08/23 1515 04/09/23 0624 04/10/23 0632  NA 139 139 137  K 4.0 3.9 4.1  CL 105 106 103  CO2 25 24 25   GLUCOSE 91 94 127*  BUN 16 12 11   CREATININE 0.67 0.75 0.64  CALCIUM 9.0 8.8* 8.9  AST  --  13* 14*  ALT  --  10 10  ALKPHOS  --  65 61  BILITOT  --  0.3 0.5  ALBUMIN  --  2.8* 2.5*  MG  --   --  1.7    ------------------------------------------------------------------------------------------------------------------ No results for input(s): "CHOL", "HDL", "LDLCALC", "TRIG", "CHOLHDL", "LDLDIRECT" in the last 72 hours.  No results found for: "HGBA1C" ------------------------------------------------------------------------------------------------------------------ No results for input(s): "TSH", "T4TOTAL", "T3FREE", "THYROIDAB" in the last 72 hours.  Invalid input(s): "FREET3"  Cardiac Enzymes No results for input(s): "CKMB", "TROPONINI", "MYOGLOBIN" in the last 168 hours.  Invalid input(s): "CK" ------------------------------------------------------------------------------------------------------------------ No results found for: "BNP"  CBG: No results for input(s): "GLUCAP" in the last 168 hours.  Recent Results (from the past 240 hour(s))  Body fluid  culture w Gram Stain     Status: None (Preliminary result)   Collection Time: 04/08/23  6:34 PM   Specimen: KNEE; Body Fluid  Result Value Ref Range Status   Specimen Description   Final    KNEE Performed at [facility], [address] [place], [place] [address]    Special Requests   Final    NONE Performed at [facility], [address] [place], [place] [address]    Gram Stain   Final    ABUNDANT WBC PRESENT, PREDOMINANTLY PMN RARE GRAM POSITIVE COCCI IN PAIRS IN CHAINS Gram Stain Report Called to,Read Back By and Verified With: RN . A. Normand Sloop [id number] @ 2236 FH    Culture   Final    TOO YOUNG TO READ Performed at [facility], [address] [place], [place] [address]    Report Status PENDING  Incomplete  Culture, blood (Routine X 2) w Reflex to ID Panel     Status: None (  Preliminary result)   Collection Time: 04/09/23  6:19 AM   Specimen: BLOOD RIGHT HAND  Result Value Ref Range Status   Specimen Description BLOOD RIGHT HAND  Final   Special Requests   Final    BOTTLES DRAWN AEROBIC ONLY Blood Culture results may not be optimal due to an inadequate volume of blood received in culture bottles   Culture   Final    NO GROWTH 1 DAY Performed at [facility], [address] [place], [place] [address]    Report Status PENDING  Incomplete  Culture, blood (Routine X 2) w Reflex to ID Panel     Status: None (Preliminary result)   Collection Time: 04/09/23  8:06 AM   Specimen: BLOOD  Result Value Ref Range Status   Specimen Description BLOOD SITE NOT SPECIFIED  Final   Special Requests   Final    BOTTLES DRAWN AEROBIC AND ANAEROBIC Blood Culture results may not be optimal due to an inadequate volume of blood received in culture bottles   Culture   Final    NO GROWTH < 24 HOURS Performed at [facility], [address] [place], [place] [address]    Report Status PENDING  Incomplete  Aerobic/Anaerobic Culture w Gram Stain  (surgical/deep wound)     Status: None (Preliminary result)   Collection Time: 04/09/23  3:28 PM   Specimen: Abscess  Result Value Ref Range Status   Specimen Description ABSCESS  Final   Special Requests right knee  Final   Gram Stain   Final    ABUNDANT WBC PRESENT, PREDOMINANTLY PMN NO ORGANISMS SEEN    Culture   Final    TOO YOUNG TO READ Performed at [facility], [address] [place], [place] [address]    Report Status PENDING  Incomplete     Radiology Studies: No results found.   Pamella Pert, MD, PhD [facility]  Between 7 am - 7 pm I am available, please contact me via Amion (for emergencies) or Securechat (non urgent messages)  Between 7 pm - 7 am I am not available, please contact night coverage MD/APP via Amion

## 2023-04-11 LAB — COMPREHENSIVE METABOLIC PANEL
ALT: 9 U/L (ref 0–44)
AST: 11 U/L — ABNORMAL LOW (ref 15–41)
Albumin: 2.4 g/dL — ABNORMAL LOW (ref 3.5–5.0)
Alkaline Phosphatase: 58 U/L (ref 38–126)
Anion gap: 9 (ref 5–15)
BUN: 10 mg/dL (ref 6–20)
CO2: 26 mmol/L (ref 22–32)
Calcium: 8.9 mg/dL (ref 8.9–10.3)
Chloride: 104 mmol/L (ref 98–111)
Creatinine, Ser: 0.6 mg/dL (ref 0.44–1.00)
GFR, Estimated: >60 mL/min (ref >60)
Glucose, Bld: 104 mg/dL — ABNORMAL HIGH (ref 70–99)
Potassium: 3.8 mmol/L (ref 3.5–5.1)
Sodium: 139 mmol/L (ref 135–145)
Total Bilirubin: 0.3 mg/dL (ref 0.3–1.2)
Total Protein: 6 g/dL — ABNORMAL LOW (ref 6.5–8.1)

## 2023-04-11 LAB — CBC
HCT: 24.6 % — ABNORMAL LOW (ref 36.0–46.0)
Hemoglobin: 6.9 g/dL — CL (ref 12.0–15.0)
MCH: 19.2 pg — ABNORMAL LOW (ref 26.0–34.0)
MCHC: 28 g/dL — ABNORMAL LOW (ref 30.0–36.0)
MCV: 68.3 fL — ABNORMAL LOW (ref 80.0–100.0)
Platelets: 507 10*3/uL — ABNORMAL HIGH (ref 150–400)
RBC: 3.6 MIL/uL — ABNORMAL LOW (ref 3.87–5.11)
RDW: 18.6 % — ABNORMAL HIGH (ref 11.5–15.5)
WBC: 6.6 10*3/uL (ref 4.0–10.5)
nRBC: 0 % (ref 0.0–0.2)

## 2023-04-11 LAB — TYPE AND SCREEN
ABO/RH(D): O POS
Antibody Screen: NEGATIVE
Unit division: 0

## 2023-04-11 LAB — BPAM RBC
Blood Product Expiration Date: 202409062359
Unit Type and Rh: 5100

## 2023-04-11 LAB — MAGNESIUM: Magnesium: 1.6 mg/dL — ABNORMAL LOW (ref 1.7–2.4)

## 2023-04-11 LAB — NO BLOOD PRODUCTS

## 2023-04-11 LAB — ABO/RH: ABO/RH(D): O POS

## 2023-04-11 LAB — PREPARE RBC (CROSSMATCH)

## 2023-04-11 MED ORDER — ENOXAPARIN SODIUM 40 MG/0.4ML IJ SOSY
40.0000 mg | PREFILLED_SYRINGE | Freq: Every day | INTRAMUSCULAR | Status: DC
  Administered 2023-04-12 – 2023-04-16 (×3): 40 mg via SUBCUTANEOUS
  Filled 2023-04-11 (×4): qty 0.4

## 2023-04-11 MED ORDER — SODIUM CHLORIDE 0.9% IV SOLUTION: Freq: Once | INTRAVENOUS | Status: DC

## 2023-04-11 MED ORDER — MAGNESIUM SULFATE 2 GM/50ML IV SOLN
2.0000 g | Freq: Once | INTRAVENOUS | Status: AC
  Administered 2023-04-11: 2 g via INTRAVENOUS
  Filled 2023-04-11: qty 50

## 2023-04-11 MED ORDER — ALBUTEROL SULFATE (2.5 MG/3ML) 0.083% IN NEBU
2.5000 mg | INHALATION_SOLUTION | RESPIRATORY_TRACT | Status: DC | PRN
  Administered 2023-04-11: 2.5 mg via RESPIRATORY_TRACT
  Filled 2023-04-11: qty 3

## 2023-04-11 NOTE — Progress Notes (Signed)
Pt refused blood transfusion. Pt educated and given refusal form to sign. MD notified and form faxed to lab.

## 2023-04-11 NOTE — Progress Notes (Signed)
Subjective: Continues to have moderate-severe pain with movement. Pain managed well at rest. Tolerating diet.  Urinating.   No CP, SOB. Has been trying to do some exercises on her own while laying in the bed as mobilizing with PT has been painful.   Objective:   VITALS:   Vitals:   04/10/23 1404 04/10/23 1950 04/11/23 0522 04/11/23 0759  BP: 129/60 128/85 (!) 155/96 (!) 159/87  Pulse: 89 95 84 87  Resp: 18 18 18 16   Temp: 98.6 F (37 C) 99.4 F (37.4 C) 98.8 F (37.1 C) 98.9 F (37.2 C)  TempSrc:  Oral Oral Oral  SpO2: 98% 97% 94% 97%  Weight:      Height:          Latest Ref Rng & Units 04/11/2023    3:20 AM 04/10/2023    6:32 AM 04/09/2023    6:24 AM  CBC  WBC 4.0 - 10.5 K/uL 6.6  9.3  6.6   Hemoglobin 12.0 - 15.0 g/dL 6.9  C 7.2  8.3   Hematocrit 36.0 - 46.0 % 24.6  25.6  29.3   Platelets 150 - 400 K/uL 507  565  594     C Corrected result      Latest Ref Rng & Units 04/11/2023    3:20 AM 04/10/2023    6:32 AM 04/09/2023    6:24 AM  BMP  Glucose 70 - 99 mg/dL 601  093  94   BUN 6 - 20 mg/dL 10  11  12    Creatinine 0.44 - 1.00 mg/dL 2.35  5.73  2.20   Sodium 135 - 145 mmol/L 139  137  139   Potassium 3.5 - 5.1 mmol/L 3.8  4.1  3.9   Chloride 98 - 111 mmol/L 104  103  106   CO2 22 - 32 mmol/L 26  25  24    Calcium 8.9 - 10.3 mg/dL 8.9  8.9  8.8    Intake/Output      08/30 0701 08/31 0700 08/31 0701 09/01 0700   P.O. 240    I.V. (mL/kg)     Total Intake(mL/kg) 240 (2)    Urine (mL/kg/hr)     Blood     Total Output     Net +240            Physical Exam: General: NAD.  Laying in bed, calm, comfortable, R leg elevated on pillows Resp: No increased wob Cardio: regular rate and rhythm ABD soft Neurologically intact MSK Neurovascularly intact Sensation intact distally Intact pulses distally Dorsiflexion/Plantar flexion intact Did not attempt R knee ROM Incision: dressing C/D/I   Assessment: 2 Days Post-Op  S/P Procedure(s) (LRB): ARTHROSCOPY  KNEE RIGHT (Right) by Dr. Jewel Baize. Eulah Pont on 04/09/23  Principal Problem:   Septic arthritis of knee, right (HCC) Active Problems:   Asthma   Chronic anemia   Arthritis, septic, knee (HCC)   Plan: Unfortunately patient has extensive osteomyelitis and infected tissue in the knee joint that has led to irreversible damage and almost complete cartilage degeneration. It will continue to worsen if left in place. She will need a 2 stage total knee arthroplasty with antibiotic components placed first by Dr. Eulah Pont and followed later by removal of that and placement of final implant components by Dr. Blanchie Dessert. She understands this.   Advance diet Up with therapy as able Incentive Spirometry Elevate and Apply ice  Anemia: patient Hbg dropped to 6.9 today, asymptomatic but has not been  up out of bed. Lovenox held today, transfusion has been ordered.   Weightbearing: WBAT RLE Insicional and dressing care: Reinforce dressings as needed Orthopedic device(s): None Showering: Keep dressing dry VTE prophylaxis: Lovenox 40mg  qd , SCDs, ambulation Pain control: PRN Follow - up plan:  TBD Contact information:  [name], [name]    Armida Sans, [name] 04/11/2023, 9:10 AM

## 2023-04-11 NOTE — Progress Notes (Signed)
Patient arrived via carelink from [facility], no iv access present and patient is a difficult stick, stat iv team consult placed by this nurse as patient is angry not to have been medicated with her dilaudid prior to transport and she also is supposed to have continuous PCN infusing, right knee with CDI ace wrap elevated on a pillow, ice packs given, instructed on call bell use, purewick placed per request of patient, she stated " I cannot walk at all, I have severe pain in my knee, and I need to pee RIGHT now!!", will notify on-call of purewick use due to existing order not to place one, patient also expressed that she has been using one at [facility], will continue to monitor.

## 2023-04-11 NOTE — Progress Notes (Signed)
Arrived for piv consult.  Pt had been discharged/transferred to [facility] hospital

## 2023-04-11 NOTE — Evaluation (Signed)
Occupational Therapy Evaluation Patient Details Name: Sharon Faulkner MRN: [id number] DOB: 1975-09-18 Today's Date: 04/11/2023   History of Present Illness 47 yo female, presents to [facility] ED 8/28 with R knee and leg pain and swelling that has been present since 7/28. Prior ED visit 7/31 Xray clear of fx but showed Effusion. Unable to draw any fluid then 8/28 R knee aspirated with small volume of purulent fluid attained. Pt transferred to [facility] 8/29 R knee arthroscopic I&D which revealed chronic infection and component of osteomyelitis  PMH: Asthma and PE not on anticoagulation.   Clinical Impression   Pt admitted for above, PTA pt was sharing w/c with her mother and reports being independent in ADLs. Pt currently limited by R Knee pain and displays need for mod to Max A for LB ADLs. Pt also not ambulatory at this time due to pain, can standing with UE support and CGA but needs increased time. Educated pt on rolling L<>R in bed for pressure relief, they verbalized understanding but are limited by pain with any mobility to R knee.  Pt would benefit from continued acute skilled OT services to address deficit and help transition to next level of care. Patient would benefit from post acute Home OT services to help maximize functional independence in natural environment       If plan is discharge home, recommend the following: A little help with walking and/or transfers;A lot of help with bathing/dressing/bathroom;Assistance with cooking/housework;Assist for transportation;Help with stairs or ramp for entrance    Functional Status Assessment  Patient has had a recent decline in their functional status and demonstrates the ability to make significant improvements in function in a reasonable and predictable amount of time.  Equipment Recommendations  BSC/3in1 (bariactric BSC needed)    Recommendations for Other Services       Precautions / Restrictions Precautions Precautions:  Fall Restrictions Weight Bearing Restrictions: No Other Position/Activity Restrictions: KI on out of bed      Mobility Bed Mobility Overal bed mobility: Needs Assistance Bed Mobility: Sit to Supine           General bed mobility comments: pt rec'd from NT, sitting on toilet. Mod A needed for RLE assist with return to bed. RLE propped on pillows to maintain knee ext. increased time needed due to pain    Transfers Overall transfer level: Needs assistance   Transfers: Sit to/from Stand Sit to Stand: Contact guard assist           General transfer comment: STS from EOB CGA with UE support. increased time needed due to pain Transfer via Lift Equipment: Stedy    Balance Overall balance assessment: Needs assistance Sitting-balance support: No upper extremity supported, Feet supported Sitting balance-Leahy Scale: Good     Standing balance support: Single extremity supported Standing balance-Leahy Scale: Fair Standing balance comment: Static standing with bed rail support                           ADL either performed or assessed with clinical judgement   ADL Overall ADL's : Needs assistance/impaired Eating/Feeding: Independent;Bed level   Grooming: Bed level;Set up   Upper Body Bathing: Independent   Lower Body Bathing: Maximal assistance   Upper Body Dressing : Independent   Lower Body Dressing: Maximal assistance   Toilet Transfer: Contact guard assist Toilet Transfer Details (indicate cue type and reason): Stedy used, STS CGA Toileting- Clothing Manipulation and Hygiene: Sit to/from stand;Moderate assistance  General ADL Comments: Increased time needed for toileting during session     Vision         Perception         Praxis         Pertinent Vitals/Pain       Extremity/Trunk Assessment Upper Extremity Assessment Upper Extremity Assessment: Overall WFL for tasks assessed   Lower Extremity Assessment RLE Deficits /  Details: R knee limited by 10/10 pain at rest lacks about 20 degrees of knee extensionR ankle ROM limited by edema RLE: Unable to fully assess due to pain RLE Sensation: decreased light touch   Cervical / Trunk Assessment Cervical / Trunk Assessment: Normal   Communication Communication Communication: No apparent difficulties   Cognition Arousal: Alert Behavior During Therapy: WFL for tasks assessed/performed Overall Cognitive Status: Within Functional Limits for tasks assessed                                       General Comments  Pt on toilet upon OT entry, very limited by pain in R knee. Educated pt on rolling for pressure relief    Exercises     Shoulder Instructions      Home Living Family/patient expects to be discharged to:: Other (Comment) (hotel) Living Arrangements: Parent (care giver for her blind [facility] mother) Available Help at Discharge: Family;Available PRN/intermittently Type of Home: Other(Comment) ([employer] Room)                       Home Equipment: Wheelchair - manual;Tub bench;Shower seat   Additional Comments: sharing wheelchair with her mother      Prior Functioning/Environment Prior Level of Function : Needs assist             Mobility Comments: transferring to mother's wheelchair for mobilization in room ADLs Comments: uses [employer], family for shopping, independent with bathing and dressing        OT Problem List: Impaired balance (sitting and/or standing);Decreased activity tolerance;Pain      OT Treatment/Interventions: Self-care/ADL training;Balance training;Therapeutic activities;Therapeutic exercise;Patient/family education;DME and/or AE instruction    OT Goals(Current goals can be found in the care plan section) Acute Rehab OT Goals Patient Stated Goal: To get better OT Goal Formulation: With patient Time For Goal Achievement: 04/24/23 Potential to Achieve Goals: Good ADL Goals Pt Will  Perform Grooming: with set-up;sitting Pt Will Perform Lower Body Bathing: with supervision;sitting/lateral leans;with adaptive equipment Pt Will Transfer to Toilet: stand pivot transfer;bedside commode;with supervision Additional ADL Goal #1: Pt will tolerate 2 mins in static standing with supervision in preparation for OOB ADLs  OT Frequency: Min 1X/week    Co-evaluation              AM-PAC OT "6 Clicks" Daily Activity     Outcome Measure Help from another person eating meals?: None Help from another person taking care of personal grooming?: A Little Help from another person toileting, which includes using toliet, bedpan, or urinal?: A Lot Help from another person bathing (including washing, rinsing, drying)?: A Lot Help from another person to put on and taking off regular upper body clothing?: None Help from another person to put on and taking off regular lower body clothing?: A Lot 6 Click Score: 17   End of Session Equipment Utilized During Treatment: Gait belt;Other (comment) Antony Salmon) Nurse Communication: Mobility status (Would benefit from purewick due to limited  OOB mobility)  Activity Tolerance: Patient tolerated treatment well Patient left: in bed;with call bell/phone within reach  OT Visit Diagnosis: Unsteadiness on feet (R26.81);Other abnormalities of gait and mobility (R26.89);Pain Pain - Right/Left: Right Pain - part of body: Knee                Time: 3244-0102 OT Time Calculation (min): 40 min Charges:  OT General Charges $OT Visit: 1 Visit OT Evaluation $OT Eval Moderate Complexity: 1 Mod OT Treatments $Therapeutic Activity: 23-37 mins  04/11/2023  AB, OTR/L  Acute Rehabilitation Services  Office: [phone]   Tristan Schroeder 04/11/2023, 5:55 PM

## 2023-04-11 NOTE — Progress Notes (Signed)
PROGRESS NOTE  Sabria Seekings WGN:[id number] DOB: [birth year] DOA: 04/08/2023 PCP: Patient, No Pcp Per   LOS: 2 days   Brief Narrative / Interim history: 47 year old female with obesity, asthma comes into the hospital with knee swelling for the past several weeks. She denies any trauma to the knee, denies any injuries, bug bites, she denies any IV drug use, no recent surgeries. Arthrocentesis in the ED showed gram-positive cocci in chains, and orthopedic surgery was consulted and she was admitted to the hospital   Subjective / 24h Interval events: Still has knee pain.  Assesement and Plan: Principal Problem:   Septic arthritis of knee, right (HCC) Active Problems:   Asthma   Chronic anemia   Arthritis, septic, knee (HCC)   Principal problem Right knee septic arthritis -hemodynamically stable, this appears to be going on for several weeks.  Orthopedic surgery consulted, status post I&D in the OR on 8/29 by Dr. Eulah Pont -Cultures growing group B strep.  Has been started on penicillin per ID -Orthopedic surgery plans for eventual knee replacement, two-stage approach with final surgery after completes antibiotics.  For surgery will be next week, scheduled at [facility].  Dr. Eulah Pont with orthopedics requested patient to be transferred for planned surgery on Tuesday -Signout given to Dr. Janee Morn   Active problems History of asthma -no wheezing, this is stable.  Resume inhaled albuterol   Chronic anemia, acute blood loss anemia-hemoglobin 6.9 this morning.  Patient does not want a blood transfusion today, not for religious reasons, but believes that her body will recover   Obesity, class III -BMI 42.9, she would benefit from weight loss  Scheduled Meds:  sodium chloride   Intravenous Once   docusate sodium  100 mg Oral BID   [START ON 04/12/2023] enoxaparin (LOVENOX) injection  40 mg Subcutaneous Daily   ferrous sulfate  325 mg Oral Daily   pantoprazole  40 mg Oral Daily   sodium chloride  flush  3 mL Intravenous Q12H   Continuous Infusions:  sodium chloride     penicillin G potassium 12 Million Units in dextrose 5 % 500 mL CONTINUOUS infusion 12 Million Units (04/11/23 1038)   PRN Meds:.sodium chloride, acetaminophen **OR** acetaminophen, albuterol, albuterol, diphenhydrAMINE, hydrALAZINE, HYDROmorphone (DILAUDID) injection, methocarbamol, metoCLOPramide **OR** metoCLOPramide (REGLAN) injection, ondansetron **OR** ondansetron (ZOFRAN) IV, oxyCODONE-acetaminophen, senna-docusate, sodium chloride flush  Current Outpatient Medications  Medication Instructions   albuterol (VENTOLIN HFA) 108 (90 Base) MCG/ACT inhaler 2 puffs, Every 6 hours PRN   diphenhydramine-acetaminophen (TYLENOL PM) 25-500 MG TABS tablet 1 tablet, Oral, At bedtime PRN   ferrous sulfate 325 mg, Oral, Daily   methocarbamol (ROBAXIN) 500 mg, Oral, Every 6 hours PRN   naproxen sodium (ALEVE) 220 mg, Oral, Daily PRN   oxyCODONE-acetaminophen (PERCOCET/ROXICET) 5-325 MG tablet 1 tablet, Oral, Every 6 hours PRN    Diet Orders (From admission, onward)     Start     Ordered   04/09/23 1717  Diet regular Room service appropriate? Yes; Fluid consistency: Thin  Diet effective now       Question Answer Comment  Room service appropriate? Yes   Fluid consistency: Thin      04/09/23 1716            DVT prophylaxis: enoxaparin (LOVENOX) injection 40 mg Start: 04/12/23 1000 SCDs Start: 04/09/23 1703 SCDs Start: 04/09/23 0146   Lab Results  Component Value Date   PLT 507 (H) 04/11/2023      Code Status: Full Code  Family Communication: no family  at bedside   Status is: Inpatient Remains inpatient appropriate because: severity of illness  Level of care: Med-Surg  Consultants:  Orthopedic surgery  ID  Objective: Vitals:   04/10/23 1404 04/10/23 1950 04/11/23 0522 04/11/23 0759  BP: 129/60 128/85 (!) 155/96 (!) 159/87  Pulse: 89 95 84 87  Resp: 18 18 18 16   Temp: 98.6 F (37 C) 99.4 F (37.4  C) 98.8 F (37.1 C) 98.9 F (37.2 C)  TempSrc:  Oral Oral Oral  SpO2: 98% 97% 94% 97%  Weight:      Height:       No intake or output data in the 24 hours ending 04/11/23 1128  Wt Readings from Last 3 Encounters:  04/09/23 122.5 kg  03/11/23 113.4 kg  08/07/20 113.9 kg    Examination:  Constitutional: NAD Eyes: lids and conjunctivae normal, no scleral icterus ENMT: mmm Neck: normal, supple Respiratory: clear to auscultation bilaterally, no wheezing, no crackles. Normal respiratory effort.  Cardiovascular: Regular rate and rhythm, no murmurs / rubs / gallops. No LE edema. Abdomen: soft, no distention, no tenderness. Bowel sounds positive.    Data Reviewed: I have independently reviewed following labs and imaging studies   CBC Recent Labs  Lab 04/08/23 1515 04/09/23 0624 04/10/23 0632 04/11/23 0320  WBC 7.6 6.6 9.3 6.6  HGB 8.9* 8.3* 7.2* 6.9*  HCT 31.1* 29.3* 25.6* 24.6*  PLT 677* 594* 565* 507*  MCV 67.0* 67.0* 67.4* 68.3*  MCH 19.2* 19.0* 18.9* 19.2*  MCHC 28.6* 28.3* 28.1* 28.0*  RDW 19.1* 18.8* 18.8* 18.6*  LYMPHSABS 1.6  --   --   --   MONOABS 0.7  --   --   --   EOSABS 0.5  --   --   --   BASOSABS 0.0  --   --   --     Recent Labs  Lab 04/08/23 1515 04/09/23 0624 04/10/23 0632 04/11/23 0320  NA 139 139 137 139  K 4.0 3.9 4.1 3.8  CL 105 106 103 104  CO2 25 24 25 26   GLUCOSE 91 94 127* 104*  BUN 16 12 11 10   CREATININE 0.67 0.75 0.64 0.60  CALCIUM 9.0 8.8* 8.9 8.9  AST  --  13* 14* 11*  ALT  --  10 10 9   ALKPHOS  --  65 61 58  BILITOT  --  0.3 0.5 0.3  ALBUMIN  --  2.8* 2.5* 2.4*  MG  --   --  1.7 1.6*    ------------------------------------------------------------------------------------------------------------------ No results for input(s): "CHOL", "HDL", "LDLCALC", "TRIG", "CHOLHDL", "LDLDIRECT" in the last 72 hours.  No results found for:  "HGBA1C" ------------------------------------------------------------------------------------------------------------------ No results for input(s): "TSH", "T4TOTAL", "T3FREE", "THYROIDAB" in the last 72 hours.  Invalid input(s): "FREET3"  Cardiac Enzymes No results for input(s): "CKMB", "TROPONINI", "MYOGLOBIN" in the last 168 hours.  Invalid input(s): "CK" ------------------------------------------------------------------------------------------------------------------ No results found for: "BNP"  CBG: No results for input(s): "GLUCAP" in the last 168 hours.  Recent Results (from the past 240 hour(s))  Body fluid culture w Gram Stain     Status: None   Collection Time: 04/08/23  6:34 PM   Specimen: KNEE; Body Fluid  Result Value Ref Range Status   Specimen Description   Final    KNEE Performed at [facility], [address] [place], [place] [address]    Special Requests   Final    NONE Performed at [facility], [address] [place],  [place] [address]    Gram Stain   Final    ABUNDANT WBC PRESENT, PREDOMINANTLY PMN RARE GRAM POSITIVE COCCI IN PAIRS IN CHAINS Gram Stain Report Called to,Read Back By and Verified With: RN . A. Normand Sloop [id number] @ 2236 FH    Culture   Final    FEW GROUP B STREP(S.AGALACTIAE)ISOLATED TESTING AGAINST S. AGALACTIAE NOT ROUTINELY PERFORMED DUE TO PREDICTABILITY OF AMP/PEN/VAN SUSCEPTIBILITY. Performed at [facility], [address], [place], [place] [address]    Report Status 04/10/2023 FINAL  Final  Culture, blood (Routine X 2) w Reflex to ID Panel     Status: None (Preliminary result)   Collection Time: 04/09/23  6:19 AM   Specimen: BLOOD RIGHT HAND  Result Value Ref Range Status   Specimen Description BLOOD RIGHT HAND  Final   Special Requests   Final    BOTTLES DRAWN AEROBIC ONLY Blood Culture results may not be optimal due to an inadequate volume of blood received in culture bottles   Culture   Final     NO GROWTH 2 DAYS Performed at [facility], [address], [place], [place] [address]    Report Status PENDING  Incomplete  Culture, blood (Routine X 2) w Reflex to ID Panel     Status: None (Preliminary result)   Collection Time: 04/09/23  8:06 AM   Specimen: BLOOD  Result Value Ref Range Status   Specimen Description BLOOD SITE NOT SPECIFIED  Final   Special Requests   Final    BOTTLES DRAWN AEROBIC AND ANAEROBIC Blood Culture results may not be optimal due to an inadequate volume of blood received in culture bottles   Culture   Final    NO GROWTH 2 DAYS Performed at [facility], [address] [place], [place] [address]    Report Status PENDING  Incomplete  Aerobic/Anaerobic Culture w Gram Stain (surgical/deep wound)     Status: None (Preliminary result)   Collection Time: 04/09/23  3:28 PM   Specimen: Abscess  Result Value Ref Range Status   Specimen Description ABSCESS  Final   Special Requests right knee  Final   Gram Stain   Final    ABUNDANT WBC PRESENT, PREDOMINANTLY PMN NO ORGANISMS SEEN    Culture   Final    MODERATE GROUP B STREP(S.AGALACTIAE)ISOLATED TESTING AGAINST S. AGALACTIAE NOT ROUTINELY PERFORMED DUE TO PREDICTABILITY OF AMP/PEN/VAN SUSCEPTIBILITY. Performed at [facility], [address] [place], [place] [address]    Report Status PENDING  Incomplete     Radiology Studies: No results found.   Pamella Pert, MD, PhD [facility]  Between 7 am - 7 pm I am available, please contact me via Amion (for emergencies) or Securechat (non urgent messages)  Between 7 pm - 7 am I am not available, please contact night coverage MD/APP via Amion

## 2023-04-12 DIAGNOSIS — M00061 Staphylococcal arthritis, right knee: Secondary | ICD-10-CM

## 2023-04-12 LAB — COMPREHENSIVE METABOLIC PANEL
ALT: 9 U/L (ref 0–44)
AST: 13 U/L — ABNORMAL LOW (ref 15–41)
Albumin: 2.9 g/dL — ABNORMAL LOW (ref 3.5–5.0)
Alkaline Phosphatase: 61 U/L (ref 38–126)
Anion gap: 9 (ref 5–15)
BUN: 13 mg/dL (ref 6–20)
CO2: 27 mmol/L (ref 22–32)
Calcium: 8.7 mg/dL — ABNORMAL LOW (ref 8.9–10.3)
Chloride: 103 mmol/L (ref 98–111)
Creatinine, Ser: 0.61 mg/dL (ref 0.44–1.00)
GFR, Estimated: >60 mL/min (ref >60)
Glucose, Bld: 114 mg/dL — ABNORMAL HIGH (ref 70–99)
Potassium: 3.9 mmol/L (ref 3.5–5.1)
Sodium: 139 mmol/L (ref 135–145)
Total Bilirubin: 0.3 mg/dL (ref 0.3–1.2)
Total Protein: 6.9 g/dL (ref 6.5–8.1)

## 2023-04-12 LAB — CBC
HCT: 26.9 % — ABNORMAL LOW (ref 36.0–46.0)
Hemoglobin: 7.5 g/dL — ABNORMAL LOW (ref 12.0–15.0)
MCH: 19.2 pg — ABNORMAL LOW (ref 26.0–34.0)
MCHC: 27.9 g/dL — ABNORMAL LOW (ref 30.0–36.0)
MCV: 69 fL — ABNORMAL LOW (ref 80.0–100.0)
Platelets: UNDETERMINED 10*3/uL (ref 150–400)
RBC: 3.9 MIL/uL (ref 3.87–5.11)
RDW: 19.1 % — ABNORMAL HIGH (ref 11.5–15.5)
WBC: 7.4 10*3/uL (ref 4.0–10.5)
nRBC: 0 % (ref 0.0–0.2)

## 2023-04-12 LAB — MAGNESIUM: Magnesium: 2 mg/dL (ref 1.7–2.4)

## 2023-04-12 MED ORDER — PENICILLIN G POTASSIUM 20000000 UNITS IJ SOLR
12.0000 10*6.[IU] | Freq: Two times a day (BID) | INTRAVENOUS | Status: DC
  Administered 2023-04-12 – 2023-04-16 (×8): 12 10*6.[IU] via INTRAVENOUS
  Filled 2023-04-12 (×14): qty 12

## 2023-04-12 NOTE — Plan of Care (Signed)

## 2023-04-12 NOTE — Progress Notes (Signed)
3 Days Post-Op Procedure(s) (LRB): ARTHROSCOPY KNEE RIGHT (Right)   Subjective: Sitting comfortably.  Continues to have moderate-severe pain with movement.  Pain managed well at rest. Tolerating diet.  No CP, SOB, N/V. Has been trying to do some exercises on her own while laying in the bed as mobilizing with PT has been painful.  Worked some with OT yesterday.    Plan for upcoming antibiotic components placement this Tuesday with Dr. Eulah Pont.   Objective:   VITALS:   Vitals:   04/11/23 1539 04/11/23 2120 04/12/23 0425 04/12/23 0543  BP: (!) 155/100 (!) 142/99  (!) 145/91  Pulse: 91 89  81  Resp: 15 18  20   Temp: 98.7 F (37.1 C) 98.7 F (37.1 C)  98.3 F (36.8 C)  TempSrc: Oral Oral  Oral  SpO2: 99% 99%  97%  Weight:   129.7 kg   Height:          Latest Ref Rng & Units 04/12/2023    3:48 AM 04/11/2023    3:20 AM 04/10/2023    6:32 AM  CBC  WBC 4.0 - 10.5 K/uL 7.4  6.6  9.3   Hemoglobin 12.0 - 15.0 g/dL 7.5  6.9  C 7.2   Hematocrit 36.0 - 46.0 % 26.9  24.6  25.6   Platelets 150 - 400 K/uL PLATELET CLUMPS NOTED ON SMEAR, UNABLE TO ESTIMATE  507  565     C Corrected result      Latest Ref Rng & Units 04/12/2023    3:48 AM 04/11/2023    3:20 AM 04/10/2023    6:32 AM  BMP  Glucose 70 - 99 mg/dL 829  562  130   BUN 6 - 20 mg/dL 13  10  11    Creatinine 0.44 - 1.00 mg/dL 8.65  7.84  6.96   Sodium 135 - 145 mmol/L 139  139  137   Potassium 3.5 - 5.1 mmol/L 3.9  3.8  4.1   Chloride 98 - 111 mmol/L 103  104  103   CO2 22 - 32 mmol/L 27  26  25    Calcium 8.9 - 10.3 mg/dL 8.7  8.9  8.9    Intake/Output      08/31 0701 09/01 0700   P.O. 960   I.V. (mL/kg) 40 (0.3)   IV Piggyback 417.6   Total Intake(mL/kg) 1417.6 (10.9)   Urine (mL/kg/hr) 1025 (0.3)   Stool 0   Total Output 1025   Net +392.6       Stool Occurrence 1 x      Physical Exam: General: NAD.  Laying in bed, calm, comfortable, R leg elevated on pillows Resp: No increased wob Cardio: regular rate and  rhythm ABD soft Neurologically intact MSK Neurovascularly intact Sensation intact distally Intact pulses distally Dorsiflexion/Plantar flexion intact Did not attempt R knee ROM Incision: dressing C/D/I   Assessment: 3 Days Post-Op  S/P Procedure(s) (LRB): ARTHROSCOPY KNEE RIGHT (Right) by Dr. Jewel Baize. Eulah Pont on 04/09/23  Principal Problem:   Septic arthritis of knee, right (HCC) Active Problems:   Asthma   Chronic anemia   Arthritis, septic, knee (HCC)   Plan: Unfortunately patient has extensive osteomyelitis and infected tissue in the knee joint that has led to irreversible damage and almost complete cartilage degeneration. It will continue to worsen if left in place. She will need a 2 stage total knee arthroplasty with antibiotic components placed first by Dr. Eulah Pont and followed later by removal of that and  placement of final implant components by Dr. Blanchie Dessert. She understands this.   Advance diet Up with therapy as able Incentive Spirometry Elevate and Apply ice  Anemia: chronic; patient Hbg dropped to 6.9 8/31, improved to 7.5 today.  Asymptomatic but has not been significantly mobile.  Lovenox held yesterday, pt declined transfusion per medicine.  Weightbearing: WBAT RLE Insicional and dressing care: Reinforce dressings as needed Orthopedic device(s): None Showering: Keep dressing dry VTE prophylaxis: Lovenox 40mg  qd , SCDs, ambulation Pain control: PRN Follow - up plan:  TBD Contact information:  [name], [name]   [name], [name] 04/12/2023, 6:57 AM

## 2023-04-12 NOTE — Progress Notes (Signed)
PROGRESS NOTE  Sharon Faulkner NUU:[id number] DOB: 1975/09/07 DOA: 04/08/2023 PCP: Patient, No Pcp Per   LOS: 3 days   Brief Narrative / Interim history: Patient is a 47 year old female, morbidly obese with history of asthma.  Patient was admitted with right knee swelling for the past several weeks.  Patient has undergone arthrocentesis.  Culture has grown strep agalactiae.  Patient is currently on penicillin G.  Orthopedic input is appreciated.  Orthopedic team may have plans for replacement of antibiotics compartment.  Subjective / 24h Interval events: No new complaints.  Assesement and Plan: Principal Problem:   Septic arthritis of knee, right (HCC) Active Problems:   Asthma   Chronic anemia   Arthritis, septic, knee (HCC)   Principal problem Right knee septic arthritis: -See above documentation. -Patient has undergone arthrocentesis. -Culture has grown group B strep. -Patient is currently on IV penicillin G. -Orthopedic plans antibiotics component placement in the next 48 hours. -Further management will depend on hospital course.    Active problems History of asthma: -Stable.   Chronic anemia, acute blood loss anemia-hemoglobin 6.9 this morning.  Patient does not want a blood transfusion today, not for religious reasons, but believes that her body will recover   Obesity, class III -BMI 42.9, she would benefit from weight loss  Scheduled Meds:  sodium chloride   Intravenous Once   docusate sodium  100 mg Oral BID   enoxaparin (LOVENOX) injection  40 mg Subcutaneous Daily   ferrous sulfate  325 mg Oral Daily   pantoprazole  40 mg Oral Daily   sodium chloride flush  3 mL Intravenous Q12H   Continuous Infusions:  sodium chloride     penicillin G potassium 12 Million Units in dextrose 5 % 500 mL CONTINUOUS infusion     PRN Meds:.sodium chloride, acetaminophen **OR** acetaminophen, albuterol, diphenhydrAMINE, hydrALAZINE, HYDROmorphone (DILAUDID) injection, methocarbamol,  metoCLOPramide **OR** metoCLOPramide (REGLAN) injection, ondansetron **OR** ondansetron (ZOFRAN) IV, oxyCODONE-acetaminophen, senna-docusate, sodium chloride flush  Current Outpatient Medications  Medication Instructions   albuterol (VENTOLIN HFA) 108 (90 Base) MCG/ACT inhaler 2 puffs, Every 6 hours PRN   diphenhydramine-acetaminophen (TYLENOL PM) 25-500 MG TABS tablet 1 tablet, Oral, At bedtime PRN   ferrous sulfate 325 mg, Oral, Daily   methocarbamol (ROBAXIN) 500 mg, Oral, Every 6 hours PRN   naproxen sodium (ALEVE) 220 mg, Oral, Daily PRN   oxyCODONE-acetaminophen (PERCOCET/ROXICET) 5-325 MG tablet 1 tablet, Oral, Every 6 hours PRN    Diet Orders (From admission, onward)     Start     Ordered   04/09/23 1717  Diet regular Room service appropriate? Yes; Fluid consistency: Thin  Diet effective now       Question Answer Comment  Room service appropriate? Yes   Fluid consistency: Thin      04/09/23 1716            DVT prophylaxis: enoxaparin (LOVENOX) injection 40 mg Start: 04/12/23 1000 SCDs Start: 04/09/23 1703 SCDs Start: 04/09/23 0146   Lab Results  Component Value Date   PLT PLATELET CLUMPS NOTED ON SMEAR, UNABLE TO ESTIMATE 04/12/2023      Code Status: Full Code  Family Communication: no family at bedside   Status is: Inpatient Remains inpatient appropriate because: severity of illness  Level of care: Med-Surg  Consultants:  Orthopedic surgery  ID  Objective: Vitals:   04/11/23 1539 04/11/23 2120 04/12/23 0425 04/12/23 0543  BP: (!) 155/100 (!) 142/99  (!) 145/91  Pulse: 91 89  81  Resp: 15 18  20  Temp: 98.7 F (37.1 C) 98.7 F (37.1 C)  98.3 F (36.8 C)  TempSrc: Oral Oral  Oral  SpO2: 99% 99%  97%  Weight:   129.7 kg   Height:        Intake/Output Summary (Last 24 hours) at 04/12/2023 1107 Last data filed at 04/12/2023 0600 Gross per 24 hour  Intake 1177.6 ml  Output 1025 ml  Net 152.6 ml    Wt Readings from Last 3 Encounters:  04/12/23  129.7 kg  03/11/23 113.4 kg  08/07/20 113.9 kg    Examination:  Constitutional: Patient is not in any distress.  Patient is morbidly obese. HEENT: Patient is pale.  No jaundice. Neck: Supple.  JVD is difficult to assess. Lungs: Decreased air entry. CVS: S1-S2. Abdomen: Morbidly obese.  Soft and nontender. Neuro: Awake and alert.  Moves all extremities. Extremities: No leg edema.  Right lower extremity is wrapped.  Data Reviewed: I have independently reviewed following labs and imaging studies   CBC Recent Labs  Lab 04/08/23 1515 04/09/23 0624 04/10/23 0632 04/11/23 0320 04/12/23 0348  WBC 7.6 6.6 9.3 6.6 7.4  HGB 8.9* 8.3* 7.2* 6.9* 7.5*  HCT 31.1* 29.3* 25.6* 24.6* 26.9*  PLT 677* 594* 565* 507* PLATELET CLUMPS NOTED ON SMEAR, UNABLE TO ESTIMATE  MCV 67.0* 67.0* 67.4* 68.3* 69.0*  MCH 19.2* 19.0* 18.9* 19.2* 19.2*  MCHC 28.6* 28.3* 28.1* 28.0* 27.9*  RDW 19.1* 18.8* 18.8* 18.6* 19.1*  LYMPHSABS 1.6  --   --   --   --   MONOABS 0.7  --   --   --   --   EOSABS 0.5  --   --   --   --   BASOSABS 0.0  --   --   --   --     Recent Labs  Lab 04/08/23 1515 04/09/23 0624 04/10/23 0632 04/11/23 0320 04/12/23 0348  NA 139 139 137 139 139  K 4.0 3.9 4.1 3.8 3.9  CL 105 106 103 104 103  CO2 25 24 25 26 27   GLUCOSE 91 94 127* 104* 114*  BUN 16 12 11 10 13   CREATININE 0.67 0.75 0.64 0.60 0.61  CALCIUM 9.0 8.8* 8.9 8.9 8.7*  AST  --  13* 14* 11* 13*  ALT  --  10 10 9 9   ALKPHOS  --  65 61 58 61  BILITOT  --  0.3 0.5 0.3 0.3  ALBUMIN  --  2.8* 2.5* 2.4* 2.9*  MG  --   --  1.7 1.6* 2.0    ------------------------------------------------------------------------------------------------------------------ No results for input(s): "CHOL", "HDL", "LDLCALC", "TRIG", "CHOLHDL", "LDLDIRECT" in the last 72 hours.  No results found for: "HGBA1C" ------------------------------------------------------------------------------------------------------------------ No results for  input(s): "TSH", "T4TOTAL", "T3FREE", "THYROIDAB" in the last 72 hours.  Invalid input(s): "FREET3"  Cardiac Enzymes No results for input(s): "CKMB", "TROPONINI", "MYOGLOBIN" in the last 168 hours.  Invalid input(s): "CK" ------------------------------------------------------------------------------------------------------------------ No results found for: "BNP"  CBG: No results for input(s): "GLUCAP" in the last 168 hours.  Recent Results (from the past 240 hour(s))  Body fluid culture w Gram Stain     Status: None   Collection Time: 04/08/23  6:34 PM   Specimen: KNEE; Body Fluid  Result Value Ref Range Status   Specimen Description   Final    KNEE Performed at [facility], [address] [place], [place] [address]    Special Requests   Final    NONE Performed  at [facility], [address] [place], [place] [address]    Gram Stain   Final    ABUNDANT WBC PRESENT, PREDOMINANTLY PMN RARE GRAM POSITIVE COCCI IN PAIRS IN CHAINS Gram Stain Report Called to,Read Back By and Verified With: RN . A. Normand Sloop [id number] @ 2236 FH    Culture   Final    FEW GROUP B STREP(S.AGALACTIAE)ISOLATED TESTING AGAINST S. AGALACTIAE NOT ROUTINELY PERFORMED DUE TO PREDICTABILITY OF AMP/PEN/VAN SUSCEPTIBILITY. Performed at [facility], [address] [place], [place] [address]    Report Status 04/10/2023 FINAL  Final  Culture, blood (Routine X 2) w Reflex to ID Panel     Status: None (Preliminary result)   Collection Time: 04/09/23  6:19 AM   Specimen: BLOOD RIGHT HAND  Result Value Ref Range Status   Specimen Description BLOOD RIGHT HAND  Final   Special Requests   Final    BOTTLES DRAWN AEROBIC ONLY Blood Culture results may not be optimal due to an inadequate volume of blood received in culture bottles   Culture   Final    NO GROWTH 3 DAYS Performed at [facility], [address], [place], [place] [address]    Report Status PENDING  Incomplete   Culture, blood (Routine X 2) w Reflex to ID Panel     Status: None (Preliminary result)   Collection Time: 04/09/23  8:06 AM   Specimen: BLOOD  Result Value Ref Range Status   Specimen Description BLOOD SITE NOT SPECIFIED  Final   Special Requests   Final    BOTTLES DRAWN AEROBIC AND ANAEROBIC Blood Culture results may not be optimal due to an inadequate volume of blood received in culture bottles   Culture   Final    NO GROWTH 3 DAYS Performed at [facility], [address], [place], [place] [address]    Report Status PENDING  Incomplete  Aerobic/Anaerobic Culture w Gram Stain (surgical/deep wound)     Status: None (Preliminary result)   Collection Time: 04/09/23  3:28 PM   Specimen: Abscess  Result Value Ref Range Status   Specimen Description ABSCESS  Final   Special Requests right knee  Final   Gram Stain   Final    ABUNDANT WBC PRESENT, PREDOMINANTLY PMN NO ORGANISMS SEEN Performed at [facility], [address] [place], [place] [address]    Culture   Final    MODERATE GROUP B STREP(S.AGALACTIAE)ISOLATED TESTING AGAINST S. AGALACTIAE NOT ROUTINELY PERFORMED DUE TO PREDICTABILITY OF AMP/PEN/VAN SUSCEPTIBILITY. NO ANAEROBES ISOLATED; CULTURE IN PROGRESS FOR 5 DAYS    Report Status PENDING  Incomplete     Radiology Studies: No results found.   Berton Mount, MD [facility]  Between 7 am - 7 pm I am available, please contact me via Amion (for emergencies) or Securechat (non urgent messages)  Between 7 pm - 7 am I am not available, please contact night coverage MD/APP via Amion

## 2023-04-12 NOTE — Progress Notes (Signed)
Patient declined mobility d/t pain, educated patient on the importance of mobility to decrease the risk of complications.  Patient refused oxycodone and requested IV dilaudid, she reports oxycodone does not help with pain and only makes her sleep. Discussed plan for pain management to include PO pain medication and reserving IV pain medication for breakthrough pain, in hopes this better controls her pain and allows her to move.

## 2023-04-12 NOTE — Plan of Care (Signed)
  Problem: Coping: Goal: Level of anxiety will decrease Outcome: Progressing   Problem: Pain Managment: Goal: General experience of comfort will improve Outcome: Progressing   Problem: Safety: Goal: Ability to remain free from injury will improve Outcome: Progressing   

## 2023-04-13 LAB — CBC
HCT: 27.1 % — ABNORMAL LOW (ref 36.0–46.0)
Hemoglobin: 7.4 g/dL — ABNORMAL LOW (ref 12.0–15.0)
MCH: 19 pg — ABNORMAL LOW (ref 26.0–34.0)
MCHC: 27.3 g/dL — ABNORMAL LOW (ref 30.0–36.0)
MCV: 69.5 fL — ABNORMAL LOW (ref 80.0–100.0)
Platelets: 538 10*3/uL — ABNORMAL HIGH (ref 150–400)
RBC: 3.9 MIL/uL (ref 3.87–5.11)
RDW: 19.3 % — ABNORMAL HIGH (ref 11.5–15.5)
WBC: 7.7 10*3/uL (ref 4.0–10.5)
nRBC: 0 % (ref 0.0–0.2)

## 2023-04-13 LAB — COMPREHENSIVE METABOLIC PANEL
ALT: 10 U/L (ref 0–44)
AST: 12 U/L — ABNORMAL LOW (ref 15–41)
Albumin: 2.9 g/dL — ABNORMAL LOW (ref 3.5–5.0)
Alkaline Phosphatase: 64 U/L (ref 38–126)
Anion gap: 5 (ref 5–15)
BUN: 16 mg/dL (ref 6–20)
CO2: 25 mmol/L (ref 22–32)
Calcium: 8.8 mg/dL — ABNORMAL LOW (ref 8.9–10.3)
Chloride: 107 mmol/L (ref 98–111)
Creatinine, Ser: 0.52 mg/dL (ref 0.44–1.00)
GFR, Estimated: >60 mL/min (ref >60)
Glucose, Bld: 104 mg/dL — ABNORMAL HIGH (ref 70–99)
Potassium: 4.3 mmol/L (ref 3.5–5.1)
Sodium: 137 mmol/L (ref 135–145)
Total Bilirubin: <0.1 mg/dL — ABNORMAL LOW (ref 0.3–1.2)
Total Protein: 6.9 g/dL (ref 6.5–8.1)

## 2023-04-13 LAB — IRON AND TIBC
Iron: 24 ug/dL — ABNORMAL LOW (ref 28–170)
Saturation Ratios: 6 % — ABNORMAL LOW (ref 10.4–31.8)
TIBC: 374 ug/dL (ref 250–450)
UIBC: 350 ug/dL

## 2023-04-13 MED ORDER — FERROUS SULFATE 325 (65 FE) MG PO TABS
325.0000 mg | ORAL_TABLET | Freq: Every day | ORAL | Status: DC
  Administered 2023-04-16 – 2023-04-19 (×4): 325 mg via ORAL
  Filled 2023-04-13 (×4): qty 1

## 2023-04-13 MED ORDER — SODIUM CHLORIDE 0.9 % IV SOLN
250.0000 mg | Freq: Every day | INTRAVENOUS | Status: AC
  Administered 2023-04-13: 250 mg via INTRAVENOUS
  Filled 2023-04-13 (×2): qty 20

## 2023-04-13 NOTE — Progress Notes (Signed)
4 Days Post-Op Procedure(s) (LRB): ARTHROSCOPY KNEE RIGHT (Right)   Subjective: Sitting comfortably.  Continues to have moderate-severe pain with movement.  Pain managed well at rest.  Tolerating diet.  No CP, SOB, N/V. Has been trying to do some exercises on her own while laying in the bed as mobilizing with PT has been painful.  Limited participation with PT/OT.   Plan for upcoming antibiotic components placement this Tuesday with Dr. Eulah Pont.  NPO past midnight.   Objective:   VITALS:   Vitals:   04/12/23 1357 04/12/23 2138 04/13/23 0500 04/13/23 0612  BP: 129/89 126/80  (!) 140/96  Pulse: 84 86  85  Resp: 15 18  18   Temp: 98.6 F (37 C) 98.6 F (37 C)  98.3 F (36.8 C)  TempSrc: Oral Oral  Oral  SpO2: 99% 97%  98%  Weight:   126.1 kg   Height:          Latest Ref Rng & Units 04/13/2023    3:32 AM 04/12/2023    3:48 AM 04/11/2023    3:20 AM  CBC  WBC 4.0 - 10.5 K/uL 7.7  7.4  6.6   Hemoglobin 12.0 - 15.0 g/dL 7.4  7.5  6.9  C  Hematocrit 36.0 - 46.0 % 27.1  26.9  24.6   Platelets 150 - 400 K/uL 538  PLATELET CLUMPS NOTED ON SMEAR, UNABLE TO ESTIMATE  507     C Corrected result      Latest Ref Rng & Units 04/13/2023    3:32 AM 04/12/2023    3:48 AM 04/11/2023    3:20 AM  BMP  Glucose 70 - 99 mg/dL 409  811  914   BUN 6 - 20 mg/dL 16  13  10    Creatinine 0.44 - 1.00 mg/dL 7.82  9.56  2.13   Sodium 135 - 145 mmol/L 137  139  139   Potassium 3.5 - 5.1 mmol/L 4.3  3.9  3.8   Chloride 98 - 111 mmol/L 107  103  104   CO2 22 - 32 mmol/L 25  27  26    Calcium 8.9 - 10.3 mg/dL 8.8  8.7  8.9    Intake/Output      09/01 0701 09/02 0700 09/02 0701 09/03 0700   P.O. 720    I.V. (mL/kg) 0 (0)    IV Piggyback 772.2    Total Intake(mL/kg) 1492.2 (11.8)    Urine (mL/kg/hr) 1800 (0.6)    Stool     Total Output 1800    Net -307.8            Physical Exam: General: NAD.  Laying in bed, calm, comfortable, R leg elevated on pillows Resp: No increased wob Cardio: regular  rate and rhythm ABD soft Neurologically intact MSK Neurovascularly intact Sensation intact distally Intact pulses distally Dorsiflexion/Plantar flexion intact Did not attempt R knee ROM Incision: dressing C/D/I   Assessment: 4 Days Post-Op  S/P Procedure(s) (LRB): ARTHROSCOPY KNEE RIGHT (Right) by Dr. Jewel Baize. Eulah Pont on 04/09/23  Principal Problem:   Septic arthritis of knee, right (HCC) Active Problems:   Asthma   Chronic anemia   Arthritis, septic, knee (HCC)   Plan: Unfortunately patient has extensive osteomyelitis and infected tissue in the knee joint that has led to irreversible damage and almost complete cartilage degeneration. It will continue to worsen if left in place. She will need a 2 stage total knee arthroplasty with antibiotic components placed first by Dr. Eulah Pont  and followed later by removal of that and placement of final implant components by Dr. Blanchie Dessert. She understands this.   Advance diet Up with therapy as able Incentive Spirometry Elevate and Apply ice  Anemia: chronic; patient Hbg dropped to 6.9 8/31, improved to 7.5 today.  Asymptomatic but has not been significantly mobile.  Lovenox held yesterday, pt declined transfusion per medicine.  Weightbearing: WBAT RLE Insicional and dressing care: Reinforce dressings as needed Orthopedic device(s): None Showering: Keep dressing dry VTE prophylaxis: Lovenox 40mg  qd , SCDs, ambulation Pain control: PRN Follow - up plan:  TBD Contact information:  [name], [name]   [name], [name] 04/13/2023, 11:26 AM

## 2023-04-13 NOTE — Progress Notes (Signed)
Pharmacy Antibiotic Note  Sharon Faulkner is a 47 y.o. female admitted on 04/08/2023 with R knee septic arthritis/OM s/p knee arthroscopy 8/30. Pharmacy has been consulted for penicillin G dosing for 6 weeks per ID. Planning stage 2 arthroplasty soon.   Plan: - Continue penicillin 12 million units IV q12hr ( =24 mil units/day) End date 10/10 - ID pharmacist to follow-up - Follow up renal function, culture results, and clinical course.    Height: 5\' 5"  (165.1 cm) Weight: 126.1 kg (278 lb) IBW/kg (Calculated) : 57  Temp (24hrs), Avg:98.5 F (36.9 C), Min:98.3 F (36.8 C), Max:98.6 F (37 C)  Recent Labs  Lab 04/09/23 0624 04/10/23 0632 04/11/23 0320 04/12/23 0348 04/13/23 0332  WBC 6.6 9.3 6.6 7.4 7.7  CREATININE 0.75 0.64 0.60 0.61 0.52    Estimated Creatinine Clearance: 114.9 mL/min (by C-G formula based on SCr of 0.52 mg/dL).    Allergies  Allergen Reactions   Shellfish Allergy Anaphylaxis and Itching   Cocos Nucifera Hives   Codeine Itching   Mango Flavor Itching    Antimicrobials this admission: 8/28 cefazolin 2g x1 8/29 vancomycin >>8/30 8/30 PCN>>   Dose adjustments this admission: none  Microbiology results: 8/29 BCx: ngtd  8/29 knee abscess: mod GBS 8/28 knee aspirate cx: few GBS  Thank you for allowing pharmacy to be a part of this patient's care.  Tacy Learn, PharmD Clinical Pharmacist 04/13/2023 12:55 PM

## 2023-04-13 NOTE — TOC Initial Note (Signed)
Transition of Care [facility]) - Initial/Assessment Note    Patient Details  Name: Sharon Faulkner MRN: [id number] Date of Birth: Nov 27, 1975  Transition of Care [facility]) CM/SW Contact:    Amada Jupiter, LCSW Phone Number: 04/13/2023, 2:08 PM  Clinical Narrative:                  Met with pt to review for anticipated dc needs.  Pt confirms surgery (I&D) planned for tomorrow with anticipation she will need IV abx at discharge.   Pt reports that she and her mother have been living in a room at [facility] [address] while work is being done on the rental home they occupy.  Notes her mother is "blind and an amputee so we help each other out" and says her sister is assisting mother currently.  Pt confirms she is uninsured and without PCP.  Pt has no preference on IV abx/ HH agencies. Have alerted Jeri Modena, RN with [employer] of pt's admission and uninsured status - she will follow along.       Expected Discharge Plan: Home w Home Health Services Barriers to Discharge: Continued Medical Work up, Inadequate or no insurance   Patient Goals and CMS Choice Patient states their goals for this hospitalization and ongoing recovery are:: "Just want to get through all this"          Expected Discharge Plan and Services In-house Referral: Clinical Social Work Discharge Planning Services: Indigent Health Clinic, Medication Assistance, [facility] Program Post Acute Care Choice: Home Health Living arrangements for the past 2 months: Hotel/Motel                                      Prior Living Arrangements/Services Living arrangements for the past 2 months: Hotel/Motel Lives with:: Parents Patient language and need for interpreter reviewed:: Yes Do you feel safe going back to the place where you live?: Yes      Need for Family Participation in Patient Care: No (Comment) Care giver support system in place?: Yes (comment)      Activities of Daily Living Home Assistive Devices/Equipment: None ADL  Screening (condition at time of admission) Patient's cognitive ability adequate to safely complete daily activities?: Yes Is the patient deaf or have difficulty hearing?: No Does the patient have difficulty seeing, even when wearing glasses/contacts?: No Does the patient have difficulty concentrating, remembering, or making decisions?: No Patient able to express need for assistance with ADLs?: Yes Does the patient have difficulty dressing or bathing?: No Independently performs ADLs?: Yes (appropriate for developmental age) Does the patient have difficulty walking or climbing stairs?: No Weakness of Legs: None Weakness of Arms/Hands: None  Permission Sought/Granted Permission sought to share information with : Family Supports Permission granted to share information with : Yes, Verbal Permission Granted  Share Information with NAME: sister, Sharon Faulkner @ [phone]           Emotional Assessment Appearance:: Appears stated age Attitude/Demeanor/Rapport: Gracious, Engaged Affect (typically observed): Frustrated Orientation: : Oriented to Self, Oriented to Place, Oriented to  Time, Oriented to Situation Alcohol / Substance Use: Not Applicable Psych Involvement: No (comment)  Admission diagnosis:  Septic arthritis (HCC) [M00.9] Effusion of right knee [M25.461] Acute pain of right knee [M25.561] Pyogenic arthritis of right knee joint, due to unspecified organism (HCC) [M00.9] Arthritis, septic, knee (HCC) [M00.9] Patient Active Problem List   Diagnosis Date Noted   Asthma  04/09/2023   Chronic anemia 04/09/2023   Arthritis, septic, knee (HCC) 04/09/2023   Septic arthritis of knee, right (HCC) 04/08/2023   Microcytic anemia 07/17/2020   Acute respiratory failure due to COVID-19 (HCC) 07/16/2020   PCP:  Patient, No Pcp Per Pharmacy:   [id number] - [place], [place] - [address] [place]  [address] Phone: [phone] Fax: [phone]     Social Determinants of Health (SDOH) Social History: SDOH Screenings   Housing: Patient Declined (04/09/2023)  Tobacco Use: High Risk (04/09/2023)   SDOH Interventions:     Readmission Risk Interventions     No data to display

## 2023-04-13 NOTE — Progress Notes (Addendum)
Physical Therapy Treatment Patient Details Name: Sharon Faulkner MRN: [id number] DOB: Jan 19, 1976 Today's Date: 04/13/2023   History of Present Illness 47 yo female, presents to [facility] ED 8/28 with R knee and leg pain and swelling that has been present since 7/28. Prior ED visit 7/31 Xray clear of fx but showed Effusion. Unable to draw any fluid then 8/28 R knee aspirated with small volume of purulent fluid attained. Pt transferred to [facility] S/p 8/29 R knee arthroscopic I&D which revealed chronic infection and component of osteomyelitis  PMH: Asthma and PE not on anticoagulation.    PT Comments  Pt reluctantly agreeable to PT; progressed to EOB with min assist for RLE d/t pain, transfers STS and pivot to chair with CGA for safety. Encouraged pt to move R knee into extension as much as able as well as working in ankle pumps bil'ly.  Plan is for placement of R knee surgery tomorrow (I&D, spacer vs TKA?) will continue to follow  in acute setting per ortho MD orders. Previous PT rec's for HHPT however it is noted pt has no ins, will update as needed.   If plan is discharge home, recommend the following: A little help with walking and/or transfers;A little help with bathing/dressing/bathroom;Help with stairs or ramp for entrance;Assist for transportation   Can travel by private vehicle        Equipment Recommendations  Rolling walker (2 wheels)    Recommendations for Other Services       Precautions / Restrictions Precautions Precautions: Fall;Knee Restrictions RLE Weight Bearing: Weight bearing as tolerated     Mobility  Bed Mobility Overal bed mobility: Needs Assistance Bed Mobility: Supine to Sit     Supine to sit: Min assist     General bed mobility comments: RLE assisted with pillow under R knee lower leg for support and pain control (pillow used like sling to move and lower leg to floor)    Transfers Overall transfer level: Needs assistance Equipment used: Rolling walker (2  wheels) Transfers: Sit to/from Stand, Bed to chair/wheelchair/BSC Sit to Stand: Supervision, Contact guard assist Stand pivot transfers: Contact guard assist         General transfer comment: STS from EOB CGA with UE support. increased time needed due to pain for pivot    Ambulation/Gait               General Gait Details: unable d/t pain   Stairs             Wheelchair Mobility     Tilt Bed    Modified Rankin (Stroke Patients Only)       Balance   Sitting-balance support: No upper extremity supported, Feet supported Sitting balance-Leahy Scale: Good     Standing balance support: During functional activity, Reliant on assistive device for balance Standing balance-Leahy Scale: Poor                              Cognition Arousal: Alert Behavior During Therapy: WFL for tasks assessed/performed Overall Cognitive Status: Within Functional Limits for tasks assessed                                          Exercises Other Exercises Other Exercises: encouraged ankle pumps and knee extension as able    General Comments  Pertinent Vitals/Pain Pain Assessment Pain Assessment: Faces Faces Pain Scale: Hurts whole lot Pain Location: R knee Pain Descriptors / Indicators: Aching, Discomfort, Constant, Guarding Pain Intervention(s): Limited activity within patient's tolerance, Monitored during session, Premedicated before session, Repositioned (ice left for pt)    Home Living                          Prior Function            PT Goals (current goals can now be found in the care plan section) Acute Rehab PT Goals PT Goal Formulation: With patient Time For Goal Achievement: 04/24/23 Potential to Achieve Goals: Fair Progress towards PT goals: Progressing toward goals    Frequency    Min 1X/week      PT Plan      Co-evaluation              AM-PAC PT "6 Clicks" Mobility   Outcome  Measure  Help needed turning from your back to your side while in a flat bed without using bedrails?: A Little Help needed moving from lying on your back to sitting on the side of a flat bed without using bedrails?: A Little Help needed moving to and from a bed to a chair (including a wheelchair)?: A Little Help needed standing up from a chair using your arms (e.g., wheelchair or bedside chair)?: A Little Help needed to walk in hospital room?: A Lot Help needed climbing 3-5 steps with a railing? : Total 6 Click Score: 15    End of Session Equipment Utilized During Treatment: Gait belt Activity Tolerance: Patient limited by pain Patient left: in chair;with call bell/phone within reach;with chair alarm set   PT Visit Diagnosis: Unsteadiness on feet (R26.81);Other abnormalities of gait and mobility (R26.89);Muscle weakness (generalized) (M62.81);Difficulty in walking, not elsewhere classified (R26.2);Pain Pain - Right/Left: Right Pain - part of body: Knee     Time: 1535-1558 PT Time Calculation (min) (ACUTE ONLY): 23 min  Charges:    $Therapeutic Activity: 23-37 mins PT General Charges $$ ACUTE PT VISIT: 1 Visit                     Racquelle Hyser, PT  Acute Rehab Dept ([facility]) [phone]  04/13/2023    [facility] 04/13/2023, 4:02 PM

## 2023-04-13 NOTE — Progress Notes (Signed)
PROGRESS NOTE  Sharon Faulkner  YQM:[id number] DOB: July 29, 1976 DOA: 04/08/2023 PCP: Patient, No Pcp Per   Brief Narrative: Patient is a 47 year old female with history of morbid obesity, asthma who presented with complaint of right knee swelling.  Orthopedics consulted, status post arthrocentesis.  Synovial fluid cultures showed strep agalactiae.  ID consulted.Currently on penicillin G.  Found to have extensive osteomyelitis, infected tissue in the knee joint that has lead to irreversible damage and almost complete cartilage degeneration.  Orthopedics closely following, planning for two-staged total knee arthroplasty with antibiotic placement.  Assessment & Plan:  Principal Problem:   Septic arthritis of knee, right (HCC) Active Problems:   Asthma   Chronic anemia   Arthritis, septic, knee (HCC)   Septic arthritis/osteomyelitis of right knee: Report of knee swelling/pain for a month.  No problem with ambulation at baseline.  Underwent arthrocentesis in the emergency department.  Arthrocentesis showed streptococcal agalactiae.  ID consulted.  No history of fever or chills. Currently on penicillin G.   MRI showed large joint effusion with high-grade synovial thickening and synovial enhancement, high-grade marrow edema .Found to have extensive osteomyelitis, infected tissue in the knee joint that has lead to irreversible damage and almost complete cartilage degeneration.  Orthopedics closely following, did arthoscopy,planning for two-stage total knee arthroplasty with antibiotic placement. Blood cultures have not shown any growth so far.  Continue pain management, supportive care.  Chronic microcytic anemia: Hemoglobin in the range of 7.  She was given a unit of blood transfusion during this hospitalization.  Continue iron supplementation.We will check iron studies  History of asthma: Currently not in exacerbation  Morbid obesity: BMI of 46.2       DVT prophylaxis:enoxaparin (LOVENOX)  injection 40 mg Start: 04/12/23 1000 SCDs Start: 04/09/23 1703 SCDs Start: 04/09/23 0146     Code Status: Full Code  Family Communication: None at bedside  Patient status:Inpatient  Patient is from :home  Anticipated discharge BM:WUXL  Estimated DC date: After clearance from ID/orthopedics   Consultants: ID, orthopedics  Procedures: Arthrocentesis,arthoscopy  Antimicrobials:  Anti-infectives (From admission, onward)    Start     Dose/Rate Route Frequency Ordered Stop   04/12/23 1200  penicillin G potassium 12 Million Units in dextrose 5 % 500 mL CONTINUOUS infusion        12 Million Units 41.7 mL/hr over 12 Hours Intravenous Every 12 hours 04/12/23 1105 05/21/23 2359   04/10/23 2000  penicillin G potassium 12 Million Units in dextrose 5 % 500 mL CONTINUOUS infusion  Status:  Discontinued        12 Million Units 41.7 mL/hr over 12 Hours Intravenous Every 12 hours 04/10/23 1850 04/12/23 1107   04/10/23 0600  vancomycin (VANCOCIN) IVPB 1000 mg/200 mL premix  Status:  Discontinued        1,000 mg 200 mL/hr over 60 Minutes Intravenous Every 12 hours 04/09/23 1434 04/10/23 1850   04/09/23 1800  vancomycin (VANCOREADY) IVPB 2000 mg/400 mL        2,000 mg 200 mL/hr over 120 Minutes Intravenous  Once 04/09/23 1434 04/09/23 2300   04/09/23 1130  ceFAZolin (ANCEF) IVPB 2g/100 mL premix        2 g 200 mL/hr over 30 Minutes Intravenous On call to O.R. 04/09/23 1044 04/09/23 1531   04/08/23 2330  ceFAZolin (ANCEF) IVPB 2g/100 mL premix        2 g 200 mL/hr over 30 Minutes Intravenous  Once 04/08/23 2327 04/09/23 0036       Subjective:  Patient seen and examined at bedside today.  She complains of pain on the right knee.  Lying in bed.  No fever, chest pain, nausea vomiting  Objective: Vitals:   04/12/23 1357 04/12/23 2138 04/13/23 0500 04/13/23 0612  BP: 129/89 126/80  (!) 140/96  Pulse: 84 86  85  Resp: 15 18  18   Temp: 98.6 F (37 C) 98.6 F (37 C)  98.3 F (36.8 C)   TempSrc: Oral Oral  Oral  SpO2: 99% 97%  98%  Weight:   126.1 kg   Height:        Intake/Output Summary (Last 24 hours) at 04/13/2023 1037 Last data filed at 04/13/2023 0616 Gross per 24 hour  Intake 1492.16 ml  Output 1800 ml  Net -307.84 ml   Filed Weights   04/09/23 1309 04/12/23 0425 04/13/23 0500  Weight: 122.5 kg 129.7 kg 126.1 kg    Examination:  General exam: Overall comfortable, not in distress,morbidly obese HEENT: PERRL Respiratory system:  no wheezes or crackles  Cardiovascular system: S1 & S2 heard, RRR.  Gastrointestinal system: Abdomen is nondistended, soft and nontender. Central nervous system: Alert and oriented Extremities: No edema, no clubbing ,no cyanosis,dressing on the right knee Skin: No rashes, no ulcers,no icterus     Data Reviewed: I have personally reviewed following labs and imaging studies  CBC: Recent Labs  Lab 04/08/23 1515 04/09/23 0624 04/10/23 0632 04/11/23 0320 04/12/23 0348 04/13/23 0332  WBC 7.6 6.6 9.3 6.6 7.4 7.7  NEUTROABS 4.9  --   --   --   --   --   HGB 8.9* 8.3* 7.2* 6.9* 7.5* 7.4*  HCT 31.1* 29.3* 25.6* 24.6* 26.9* 27.1*  MCV 67.0* 67.0* 67.4* 68.3* 69.0* 69.5*  PLT 677* 594* 565* 507* PLATELET CLUMPS NOTED ON SMEAR, UNABLE TO ESTIMATE 538*   Basic Metabolic Panel: Recent Labs  Lab 04/09/23 0624 04/10/23 0632 04/11/23 0320 04/12/23 0348 04/13/23 0332  NA 139 137 139 139 137  K 3.9 4.1 3.8 3.9 4.3  CL 106 103 104 103 107  CO2 24 25 26 27 25   GLUCOSE 94 127* 104* 114* 104*  BUN 12 11 10 13 16   CREATININE 0.75 0.64 0.60 0.61 0.52  CALCIUM 8.8* 8.9 8.9 8.7* 8.8*  MG  --  1.7 1.6* 2.0  --      Recent Results (from the past 240 hour(s))  Body fluid culture w Gram Stain     Status: None   Collection Time: 04/08/23  6:34 PM   Specimen: KNEE; Body Fluid  Result Value Ref Range Status   Specimen Description   Final    KNEE Performed at [facility], [address] [place], [place] [address]     Special Requests   Final    NONE Performed at [facility], [address] [place], [place] [address]    Gram Stain   Final    ABUNDANT WBC PRESENT, PREDOMINANTLY PMN RARE GRAM POSITIVE COCCI IN PAIRS IN CHAINS Gram Stain Report Called to,Read Back By and Verified With: RN . A. Normand Sloop [id number] @ 2236 FH    Culture   Final    FEW GROUP B STREP(S.AGALACTIAE)ISOLATED TESTING AGAINST S. AGALACTIAE NOT ROUTINELY PERFORMED DUE TO PREDICTABILITY OF AMP/PEN/VAN SUSCEPTIBILITY. Performed at [facility], [address], [place], [place] [address]    Report Status 04/10/2023 FINAL  Final  Culture, blood (Routine X 2) w Reflex to ID Panel  Status: None (Preliminary result)   Collection Time: 04/09/23  6:19 AM   Specimen: BLOOD RIGHT HAND  Result Value Ref Range Status   Specimen Description BLOOD RIGHT HAND  Final   Special Requests   Final    BOTTLES DRAWN AEROBIC ONLY Blood Culture results may not be optimal due to an inadequate volume of blood received in culture bottles   Culture   Final    NO GROWTH 4 DAYS Performed at [facility], [address], [place], [place] [address]    Report Status PENDING  Incomplete  Culture, blood (Routine X 2) w Reflex to ID Panel     Status: None (Preliminary result)   Collection Time: 04/09/23  8:06 AM   Specimen: BLOOD  Result Value Ref Range Status   Specimen Description BLOOD SITE NOT SPECIFIED  Final   Special Requests   Final    BOTTLES DRAWN AEROBIC AND ANAEROBIC Blood Culture results may not be optimal due to an inadequate volume of blood received in culture bottles   Culture   Final    NO GROWTH 4 DAYS Performed at [facility], [address], [place], [place] [address]    Report Status PENDING  Incomplete  Aerobic/Anaerobic Culture w Gram Stain (surgical/deep wound)     Status: None (Preliminary result)   Collection Time: 04/09/23  3:28 PM   Specimen: Abscess  Result Value Ref Range Status   Specimen  Description ABSCESS  Final   Special Requests right knee  Final   Gram Stain   Final    ABUNDANT WBC PRESENT, PREDOMINANTLY PMN NO ORGANISMS SEEN Performed at [facility], [address], [place], [place] [address]    Culture   Final    MODERATE GROUP B STREP(S.AGALACTIAE)ISOLATED TESTING AGAINST S. AGALACTIAE NOT ROUTINELY PERFORMED DUE TO PREDICTABILITY OF AMP/PEN/VAN SUSCEPTIBILITY. NO ANAEROBES ISOLATED; CULTURE IN PROGRESS FOR 5 DAYS    Report Status PENDING  Incomplete     Radiology Studies: No results found.  Scheduled Meds:  sodium chloride   Intravenous Once   docusate sodium  100 mg Oral BID   enoxaparin (LOVENOX) injection  40 mg Subcutaneous Daily   ferrous sulfate  325 mg Oral Daily   pantoprazole  40 mg Oral Daily   sodium chloride flush  3 mL Intravenous Q12H   Continuous Infusions:  sodium chloride     penicillin G potassium 12 Million Units in dextrose 5 % 500 mL CONTINUOUS infusion 12 Million Units (04/13/23 0051)     LOS: 4 days   Burnadette Pop, MD [facility] P9/09/2022, 10:37 AM

## 2023-04-14 ENCOUNTER — Inpatient Hospital Stay (HOSPITAL_COMMUNITY): Payer: Medicaid Other | Admitting: Anesthesiology

## 2023-04-14 ENCOUNTER — Other Ambulatory Visit: Payer: Self-pay

## 2023-04-14 ENCOUNTER — Encounter (HOSPITAL_COMMUNITY): Payer: Self-pay | Admitting: Internal Medicine

## 2023-04-14 ENCOUNTER — Inpatient Hospital Stay (HOSPITAL_COMMUNITY): Payer: Medicaid Other

## 2023-04-14 ENCOUNTER — Encounter (HOSPITAL_COMMUNITY)
Admission: EM | Disposition: A | Discharge status: Home Health | Payer: Self-pay | Source: Home / Self Care | Attending: Internal Medicine

## 2023-04-14 DIAGNOSIS — Z87891 Personal history of nicotine dependence: Secondary | ICD-10-CM

## 2023-04-14 DIAGNOSIS — M1711 Unilateral primary osteoarthritis, right knee: Secondary | ICD-10-CM

## 2023-04-14 DIAGNOSIS — J45909 Unspecified asthma, uncomplicated: Secondary | ICD-10-CM

## 2023-04-14 HISTORY — PX: TOTAL KNEE ARTHROPLASTY: SHX125

## 2023-04-14 HISTORY — PX: INCISION AND DRAINAGE ABSCESS: SHX5864

## 2023-04-14 LAB — CULTURE, BLOOD (ROUTINE X 2)
Culture: NO GROWTH
Culture: NO GROWTH

## 2023-04-14 LAB — CBC
HCT: 27.6 % — ABNORMAL LOW (ref 36.0–46.0)
Hemoglobin: 7.6 g/dL — ABNORMAL LOW (ref 12.0–15.0)
MCH: 19.3 pg — ABNORMAL LOW (ref 26.0–34.0)
MCHC: 27.5 g/dL — ABNORMAL LOW (ref 30.0–36.0)
MCV: 70.1 fL — ABNORMAL LOW (ref 80.0–100.0)
Platelets: 476 10*3/uL — ABNORMAL HIGH (ref 150–400)
RBC: 3.94 MIL/uL (ref 3.87–5.11)
RDW: 19.5 % — ABNORMAL HIGH (ref 11.5–15.5)
WBC: 8.4 10*3/uL (ref 4.0–10.5)
nRBC: 0 % (ref 0.0–0.2)

## 2023-04-14 LAB — POCT I-STAT EG7
Acid-base deficit: 3 mmol/L — ABNORMAL HIGH (ref 0.0–2.0)
Bicarbonate: 21.8 mmol/L (ref 20.0–28.0)
Calcium, Ion: 1.14 mmol/L — ABNORMAL LOW (ref 1.15–1.40)
HCT: 22 % — ABNORMAL LOW (ref 36.0–46.0)
Hemoglobin: 7.5 g/dL — ABNORMAL LOW (ref 12.0–15.0)
O2 Saturation: 73 %
Potassium: 4.2 mmol/L (ref 3.5–5.1)
Sodium: 138 mmol/L (ref 135–145)
TCO2: 23 mmol/L (ref 22–32)
pCO2, Ven: 37.4 mmHg — ABNORMAL LOW (ref 44–60)
pH, Ven: 7.373 (ref 7.25–7.43)
pO2, Ven: 40 mmHg (ref 32–45)

## 2023-04-14 LAB — AEROBIC/ANAEROBIC CULTURE W GRAM STAIN (SURGICAL/DEEP WOUND)

## 2023-04-14 LAB — SURGICAL PCR SCREEN
MRSA, PCR: NEGATIVE
Staphylococcus aureus: NEGATIVE

## 2023-04-14 LAB — PREPARE RBC (CROSSMATCH)

## 2023-04-14 SURGERY — INCISION AND DRAINAGE, ABSCESS
Anesthesia: General | Site: Knee | Laterality: Right

## 2023-04-14 MED ORDER — MIDAZOLAM HCL 2 MG/2ML IJ SOLN
1.0000 mg | INTRAMUSCULAR | Status: AC
  Administered 2023-04-14: 2 mg via INTRAVENOUS
  Filled 2023-04-14: qty 2

## 2023-04-14 MED ORDER — HYDROMORPHONE HCL 1 MG/ML IJ SOLN
INTRAMUSCULAR | Status: AC
  Administered 2023-04-14: 0.5 mg via INTRAVENOUS
  Filled 2023-04-14: qty 1

## 2023-04-14 MED ORDER — DEXAMETHASONE SODIUM PHOSPHATE 10 MG/ML IJ SOLN
10.0000 mg | Freq: Once | INTRAMUSCULAR | Status: AC
  Administered 2023-04-15: 10 mg via INTRAVENOUS
  Filled 2023-04-14: qty 1

## 2023-04-14 MED ORDER — PROPOFOL 10 MG/ML IV BOLUS
INTRAVENOUS | Status: DC | PRN
  Administered 2023-04-14: 120 mg via INTRAVENOUS

## 2023-04-14 MED ORDER — DIPHENHYDRAMINE HCL 12.5 MG/5ML PO ELIX: 12.5000 mg | ORAL_SOLUTION | ORAL | Status: DC | PRN

## 2023-04-14 MED ORDER — FENTANYL CITRATE (PF) 100 MCG/2ML IJ SOLN
INTRAMUSCULAR | Status: DC | PRN
  Administered 2023-04-14 (×3): 100 ug via INTRAVENOUS
  Administered 2023-04-14 (×3): 50 ug via INTRAVENOUS

## 2023-04-14 MED ORDER — HYDROMORPHONE HCL 1 MG/ML IJ SOLN
0.5000 mg | INTRAMUSCULAR | Status: DC | PRN
  Administered 2023-04-15 (×3): 1 mg via INTRAVENOUS
  Filled 2023-04-14 (×3): qty 1

## 2023-04-14 MED ORDER — SODIUM CHLORIDE 0.9 % IV SOLN: INTRAVENOUS | Status: DC

## 2023-04-14 MED ORDER — FENTANYL CITRATE PF 50 MCG/ML IJ SOSY
50.0000 ug | PREFILLED_SYRINGE | INTRAMUSCULAR | Status: AC
  Administered 2023-04-14: 100 ug via INTRAVENOUS
  Filled 2023-04-14: qty 2

## 2023-04-14 MED ORDER — DOCUSATE SODIUM 100 MG PO CAPS
100.0000 mg | ORAL_CAPSULE | Freq: Two times a day (BID) | ORAL | Status: DC
  Administered 2023-04-14 – 2023-04-19 (×10): 100 mg via ORAL
  Filled 2023-04-14 (×10): qty 1

## 2023-04-14 MED ORDER — ALUM & MAG HYDROXIDE-SIMETH 200-200-20 MG/5ML PO SUSP: 30.0000 mL | ORAL | Status: DC | PRN

## 2023-04-14 MED ORDER — ACETAMINOPHEN 10 MG/ML IV SOLN
INTRAVENOUS | Status: AC
  Filled 2023-04-14: qty 100

## 2023-04-14 MED ORDER — BISACODYL 10 MG RE SUPP: 10.0000 mg | Freq: Every day | RECTAL | Status: DC | PRN

## 2023-04-14 MED ORDER — TOBRAMYCIN SULFATE 1.2 G IJ SOLR
INTRAMUSCULAR | Status: AC
  Filled 2023-04-14: qty 1.2

## 2023-04-14 MED ORDER — VANCOMYCIN HCL 1000 MG IV SOLR
INTRAVENOUS | Status: AC
  Filled 2023-04-14: qty 20

## 2023-04-14 MED ORDER — CLONIDINE HCL (ANALGESIA) 100 MCG/ML EP SOLN
EPIDURAL | Status: DC | PRN
Start: 2023-04-14 — End: 2023-04-14
  Administered 2023-04-14: 100 ug

## 2023-04-14 MED ORDER — LACTATED RINGERS IV SOLN: INTRAVENOUS | Status: DC

## 2023-04-14 MED ORDER — ROPIVACAINE HCL 5 MG/ML IJ SOLN
INTRAMUSCULAR | Status: DC | PRN
Start: 2023-04-14 — End: 2023-04-14
  Administered 2023-04-14: 20 mL via PERINEURAL

## 2023-04-14 MED ORDER — ONDANSETRON HCL 4 MG/2ML IJ SOLN: 4.0000 mg | Freq: Once | INTRAMUSCULAR | Status: DC | PRN

## 2023-04-14 MED ORDER — DEXTROSE 5 % IV SOLN
INTRAVENOUS | Status: DC | PRN
  Administered 2023-04-14: 3 g via INTRAVENOUS

## 2023-04-14 MED ORDER — METHOCARBAMOL 1000 MG/10ML IJ SOLN: 500.0000 mg | Freq: Four times a day (QID) | INTRAVENOUS | Status: DC | PRN

## 2023-04-14 MED ORDER — MENTHOL 3 MG MT LOZG: 1.0000 | LOZENGE | OROMUCOSAL | Status: DC | PRN

## 2023-04-14 MED ORDER — CHLORHEXIDINE GLUCONATE 0.12 % MT SOLN
15.0000 mL | Freq: Once | OROMUCOSAL | Status: AC
  Administered 2023-04-14: 15 mL via OROMUCOSAL

## 2023-04-14 MED ORDER — LIDOCAINE HCL (PF) 2 % IJ SOLN
INTRAMUSCULAR | Status: AC
  Filled 2023-04-14: qty 5

## 2023-04-14 MED ORDER — METOCLOPRAMIDE HCL 5 MG/ML IJ SOLN: 5.0000 mg | Freq: Three times a day (TID) | INTRAMUSCULAR | Status: DC | PRN

## 2023-04-14 MED ORDER — AMISULPRIDE (ANTIEMETIC) 5 MG/2ML IV SOLN: 10.0000 mg | Freq: Once | INTRAVENOUS | Status: DC | PRN

## 2023-04-14 MED ORDER — METOCLOPRAMIDE HCL 5 MG PO TABS: 5.0000 mg | ORAL_TABLET | Freq: Three times a day (TID) | ORAL | Status: DC | PRN

## 2023-04-14 MED ORDER — ACETAMINOPHEN 325 MG PO TABS
325.0000 mg | ORAL_TABLET | Freq: Four times a day (QID) | ORAL | Status: DC | PRN
  Administered 2023-04-17 – 2023-04-18 (×5): 650 mg via ORAL
  Filled 2023-04-14 (×5): qty 2

## 2023-04-14 MED ORDER — ONDANSETRON HCL 4 MG/2ML IJ SOLN
INTRAMUSCULAR | Status: AC
  Filled 2023-04-14: qty 2

## 2023-04-14 MED ORDER — TRANEXAMIC ACID-NACL 1000-0.7 MG/100ML-% IV SOLN
INTRAVENOUS | Status: AC
  Filled 2023-04-14: qty 100

## 2023-04-14 MED ORDER — LIDOCAINE HCL (CARDIAC) PF 100 MG/5ML IV SOSY
PREFILLED_SYRINGE | INTRAVENOUS | Status: DC | PRN
  Administered 2023-04-14: 80 mg via INTRAVENOUS

## 2023-04-14 MED ORDER — SODIUM CHLORIDE 0.9% FLUSH
INTRAVENOUS | Status: DC | PRN
  Administered 2023-04-14: 30 mL

## 2023-04-14 MED ORDER — CEFAZOLIN IN SODIUM CHLORIDE 3-0.9 GM/100ML-% IV SOLN
INTRAVENOUS | Status: AC
  Filled 2023-04-14: qty 100

## 2023-04-14 MED ORDER — FENTANYL CITRATE (PF) 250 MCG/5ML IJ SOLN
INTRAMUSCULAR | Status: AC
  Filled 2023-04-14: qty 5

## 2023-04-14 MED ORDER — PHENYLEPHRINE HCL (PRESSORS) 10 MG/ML IV SOLN
INTRAVENOUS | Status: DC | PRN
Start: 2023-04-14 — End: 2023-04-14
  Administered 2023-04-14: 240 ug via INTRAVENOUS

## 2023-04-14 MED ORDER — ALBUMIN HUMAN 5 % IV SOLN
INTRAVENOUS | Status: DC | PRN
Start: 2023-04-14 — End: 2023-04-14

## 2023-04-14 MED ORDER — SODIUM CHLORIDE 0.9 % IV SOLN
10.0000 mL/h | Freq: Once | INTRAVENOUS | Status: AC
  Administered 2023-04-14: 10 mL/h via INTRAVENOUS

## 2023-04-14 MED ORDER — LACTATED RINGERS IV SOLN: INTRAVENOUS | Status: DC | PRN

## 2023-04-14 MED ORDER — TRANEXAMIC ACID-NACL 1000-0.7 MG/100ML-% IV SOLN
1000.0000 mg | Freq: Once | INTRAVENOUS | Status: AC
  Administered 2023-04-14: 1000 mg via INTRAVENOUS
  Filled 2023-04-14: qty 100

## 2023-04-14 MED ORDER — BUPIVACAINE-EPINEPHRINE 0.25% -1:200000 IJ SOLN
INTRAMUSCULAR | Status: DC | PRN
  Administered 2023-04-14: 30 mL

## 2023-04-14 MED ORDER — SODIUM CHLORIDE 0.9 % IR SOLN
Status: DC | PRN
  Administered 2023-04-14: 2000 mL

## 2023-04-14 MED ORDER — OXYCODONE HCL 5 MG PO TABS: 5.0000 mg | ORAL_TABLET | ORAL | Status: DC | PRN

## 2023-04-14 MED ORDER — ONDANSETRON HCL 4 MG/2ML IJ SOLN: 4.0000 mg | Freq: Four times a day (QID) | INTRAMUSCULAR | Status: DC | PRN

## 2023-04-14 MED ORDER — WATER FOR IRRIGATION, STERILE IR SOLN
Status: DC | PRN
  Administered 2023-04-14: 2000 mL

## 2023-04-14 MED ORDER — 0.9 % SODIUM CHLORIDE (POUR BTL) OPTIME
TOPICAL | Status: DC | PRN
  Administered 2023-04-14: 1000 mL

## 2023-04-14 MED ORDER — DEXAMETHASONE SODIUM PHOSPHATE 10 MG/ML IJ SOLN
INTRAMUSCULAR | Status: DC | PRN
  Administered 2023-04-14: 8 mg via INTRAVENOUS

## 2023-04-14 MED ORDER — METHOCARBAMOL 500 MG PO TABS
500.0000 mg | ORAL_TABLET | Freq: Four times a day (QID) | ORAL | Status: DC | PRN
  Administered 2023-04-14 – 2023-04-19 (×12): 500 mg via ORAL
  Filled 2023-04-14 (×12): qty 1

## 2023-04-14 MED ORDER — LABETALOL HCL 5 MG/ML IV SOLN
INTRAVENOUS | Status: AC
  Filled 2023-04-14: qty 4

## 2023-04-14 MED ORDER — EPINEPHRINE PF 1 MG/ML IJ SOLN
INTRAMUSCULAR | Status: AC
  Filled 2023-04-14: qty 1

## 2023-04-14 MED ORDER — SUGAMMADEX SODIUM 200 MG/2ML IV SOLN
INTRAVENOUS | Status: DC | PRN
Start: 2023-04-14 — End: 2023-04-14
  Administered 2023-04-14: 350 mg via INTRAVENOUS

## 2023-04-14 MED ORDER — DEXAMETHASONE SODIUM PHOSPHATE 10 MG/ML IJ SOLN
INTRAMUSCULAR | Status: AC
  Filled 2023-04-14: qty 1

## 2023-04-14 MED ORDER — HYDROMORPHONE HCL 1 MG/ML IJ SOLN
0.2500 mg | INTRAMUSCULAR | Status: DC | PRN
  Administered 2023-04-14 (×2): 0.5 mg via INTRAVENOUS

## 2023-04-14 MED ORDER — FENTANYL CITRATE (PF) 100 MCG/2ML IJ SOLN
INTRAMUSCULAR | Status: AC
  Filled 2023-04-14: qty 2

## 2023-04-14 MED ORDER — ACETAMINOPHEN 500 MG PO TABS: 1000.0000 mg | ORAL_TABLET | Freq: Once | ORAL | Status: DC

## 2023-04-14 MED ORDER — MAGNESIUM CITRATE PO SOLN: 1.0000 | Freq: Once | ORAL | Status: DC | PRN

## 2023-04-14 MED ORDER — PHENOL 1.4 % MT LIQD: 1.0000 | OROMUCOSAL | Status: DC | PRN

## 2023-04-14 MED ORDER — ROCURONIUM BROMIDE 100 MG/10ML IV SOLN
INTRAVENOUS | Status: DC | PRN
  Administered 2023-04-14: 60 mg via INTRAVENOUS
  Administered 2023-04-14: 40 mg via INTRAVENOUS

## 2023-04-14 MED ORDER — ACETAMINOPHEN 500 MG PO TABS
1000.0000 mg | ORAL_TABLET | Freq: Four times a day (QID) | ORAL | Status: AC
  Administered 2023-04-15 (×4): 1000 mg via ORAL
  Filled 2023-04-14 (×4): qty 2

## 2023-04-14 MED ORDER — SODIUM CHLORIDE (PF) 0.9 % IJ SOLN
INTRAMUSCULAR | Status: AC
  Filled 2023-04-14: qty 50

## 2023-04-14 MED ORDER — BUPIVACAINE LIPOSOME 1.3 % IJ SUSP
INTRAMUSCULAR | Status: DC | PRN
  Administered 2023-04-14: 20 mL

## 2023-04-14 MED ORDER — POLYETHYLENE GLYCOL 3350 17 G PO PACK: 17.0000 g | PACK | Freq: Every day | ORAL | Status: DC | PRN

## 2023-04-14 MED ORDER — LABETALOL HCL 5 MG/ML IV SOLN
INTRAVENOUS | Status: DC | PRN
Start: 2023-04-14 — End: 2023-04-14
  Administered 2023-04-14 (×2): 5 mg via INTRAVENOUS

## 2023-04-14 MED ORDER — BUPIVACAINE LIPOSOME 1.3 % IJ SUSP
INTRAMUSCULAR | Status: AC
  Filled 2023-04-14: qty 20

## 2023-04-14 MED ORDER — OXYCODONE HCL 5 MG PO TABS
10.0000 mg | ORAL_TABLET | ORAL | Status: DC | PRN
  Administered 2023-04-14 – 2023-04-19 (×17): 15 mg via ORAL
  Filled 2023-04-14 (×17): qty 3

## 2023-04-14 MED ORDER — ACETAMINOPHEN 10 MG/ML IV SOLN
1000.0000 mg | Freq: Once | INTRAVENOUS | Status: AC
  Administered 2023-04-14: 1000 mg via INTRAVENOUS

## 2023-04-14 MED ORDER — ONDANSETRON HCL 4 MG PO TABS: 4.0000 mg | ORAL_TABLET | Freq: Four times a day (QID) | ORAL | Status: DC | PRN

## 2023-04-14 MED ORDER — PROPOFOL 10 MG/ML IV BOLUS
INTRAVENOUS | Status: AC
  Filled 2023-04-14: qty 20

## 2023-04-14 MED ORDER — GENTAMICIN SULFATE 40 MG/ML IJ SOLN
800.0000 mg | Freq: Once | INTRAMUSCULAR | Status: DC
  Filled 2023-04-14: qty 20

## 2023-04-14 MED ORDER — ONDANSETRON HCL 4 MG/2ML IJ SOLN
INTRAMUSCULAR | Status: DC | PRN
  Administered 2023-04-14: 4 mg via INTRAVENOUS

## 2023-04-14 MED ORDER — FENTANYL CITRATE PF 50 MCG/ML IJ SOSY
25.0000 ug | PREFILLED_SYRINGE | INTRAMUSCULAR | Status: DC | PRN
  Administered 2023-04-14 (×2): 50 ug via INTRAVENOUS

## 2023-04-14 MED ORDER — FENTANYL CITRATE PF 50 MCG/ML IJ SOSY
PREFILLED_SYRINGE | INTRAMUSCULAR | Status: AC
  Administered 2023-04-14: 50 ug via INTRAVENOUS
  Filled 2023-04-14: qty 3

## 2023-04-14 SURGICAL SUPPLY — 61 items
APL SKNCLS STERI-STRIP NONHPOA (GAUZE/BANDAGES/DRESSINGS) ×2
BAG COUNTER SPONGE SURGICOUNT (BAG) IMPLANT
BAG SPNG CNTER NS LX DISP (BAG) ×1
BENZOIN TINCTURE PRP APPL 2/3 (GAUZE/BANDAGES/DRESSINGS) IMPLANT
BLADE SAG 18X100X1.27 (BLADE) ×1 IMPLANT
BLADE SAGITTAL 25.0X1.37X90 (BLADE) ×1 IMPLANT
BLADE SURG 15 STRL LF DISP TIS (BLADE) ×1 IMPLANT
BLADE SURG 15 STRL SS (BLADE) ×1
BNDG CMPR MED 10X6 ELC LF (GAUZE/BANDAGES/DRESSINGS) ×1
BNDG ELASTIC 6X10 VLCR STRL LF (GAUZE/BANDAGES/DRESSINGS) ×1 IMPLANT
BONE CEMENT SIMPLEX TOBRAMYCIN (Cement) ×2 IMPLANT
BOWL SMART MIX CTS (DISPOSABLE) IMPLANT
CEMENT BONE SIMPLEX TOBRAMYCIN (Cement) IMPLANT
CLSR STERI-STRIP ANTIMIC 1/2X4 (GAUZE/BANDAGES/DRESSINGS) ×2 IMPLANT
COVER SURGICAL LIGHT HANDLE (MISCELLANEOUS) ×1 IMPLANT
CUFF TOURN SGL QUICK 34 (TOURNIQUET CUFF)
CUFF TOURN SGL QUICK 42 (TOURNIQUET CUFF) IMPLANT
CUFF TRNQT CYL 34X4.125X (TOURNIQUET CUFF) ×1 IMPLANT
DRAPE U-SHAPE 47X51 STRL (DRAPES) ×1 IMPLANT
DRSG MEPILEX POST OP 4X12 (GAUZE/BANDAGES/DRESSINGS) ×1 IMPLANT
DRSG MEPILEX POST OP 4X8 (GAUZE/BANDAGES/DRESSINGS) IMPLANT
DURAPREP 26ML APPLICATOR (WOUND CARE) ×2 IMPLANT
ELECT REM PT RETURN 15FT ADLT (MISCELLANEOUS) ×1 IMPLANT
FEMORAL CRUCIATE RETAINED (Knees) IMPLANT
GLOVE BIO SURGEON STRL SZ7.5 (GLOVE) ×2 IMPLANT
GLOVE BIOGEL PI IND STRL 7.5 (GLOVE) ×1 IMPLANT
GLOVE BIOGEL PI IND STRL 8 (GLOVE) ×1 IMPLANT
GLOVE SURG SYN 7.5 E (GLOVE) ×1 IMPLANT
GLOVE SURG SYN 7.5 PF PI (GLOVE) ×1 IMPLANT
GOWN STRL REUS W/ TWL LRG LVL3 (GOWN DISPOSABLE) ×1 IMPLANT
GOWN STRL REUS W/ TWL XL LVL3 (GOWN DISPOSABLE) ×1 IMPLANT
GOWN STRL REUS W/TWL LRG LVL3 (GOWN DISPOSABLE) ×1
GOWN STRL REUS W/TWL XL LVL3 (GOWN DISPOSABLE) ×1
HANDPIECE INTERPULSE COAX TIP (DISPOSABLE) ×1
HOLDER FOLEY CATH W/STRAP (MISCELLANEOUS) IMPLANT
IMMOBILIZER KNEE 22 UNIV (SOFTGOODS) IMPLANT
INSERT TIB TRIATH KNEE 3 9 (Insert) IMPLANT
JET LAVAGE IRRISEPT WOUND (IRRIGATION / IRRIGATOR) ×1
KIT STIMULAN RAPID CURE 10CC (Orthopedic Implant) IMPLANT
KIT TURNOVER KIT A (KITS) IMPLANT
LAVAGE JET IRRISEPT WOUND (IRRIGATION / IRRIGATOR) IMPLANT
MANIFOLD NEPTUNE II (INSTRUMENTS) ×1 IMPLANT
NS IRRIG 1000ML POUR BTL (IV SOLUTION) ×1 IMPLANT
PACK TOTAL KNEE CUSTOM (KITS) ×1 IMPLANT
PIN FLUTED HEDLESS FIX 3.5X1/8 (PIN) IMPLANT
PROTECTOR NERVE ULNAR (MISCELLANEOUS) ×1 IMPLANT
SEALER BIPOLAR AQUA 6.0 (INSTRUMENTS) IMPLANT
SET HNDPC FAN SPRY TIP SCT (DISPOSABLE) ×1 IMPLANT
SMARTMIX MINI TOWER (MISCELLANEOUS) ×1
SPIKE FLUID TRANSFER (MISCELLANEOUS) ×1 IMPLANT
SUT MNCRL AB 3-0 PS2 18 (SUTURE) ×1 IMPLANT
SUT VIC AB 0 CT1 36 (SUTURE) ×1 IMPLANT
SUT VIC AB 1 CT1 36 (SUTURE) ×1 IMPLANT
SUT VIC AB 2-0 CT1 27 (SUTURE) ×1
SUT VIC AB 2-0 CT1 TAPERPNT 27 (SUTURE) ×1 IMPLANT
TOWEL GREEN STERILE FF (TOWEL DISPOSABLE) ×1 IMPLANT
TOWER SMARTMIX MINI (MISCELLANEOUS) IMPLANT
TRAY FOLEY MTR SLVR 14FR STAT (SET/KITS/TRAYS/PACK) IMPLANT
TRAY FOLEY MTR SLVR 16FR STAT (SET/KITS/TRAYS/PACK) IMPLANT
TUBE SUCTION HIGH CAP CLEAR NV (SUCTIONS) ×1 IMPLANT
WRAP KNEE MAXI GEL POST OP (GAUZE/BANDAGES/DRESSINGS) ×1 IMPLANT

## 2023-04-14 NOTE — Anesthesia Preprocedure Evaluation (Addendum)
Anesthesia Evaluation  Patient identified by MRN, date of birth, ID band Patient awake    Reviewed: Allergy & Precautions, NPO status , Patient's Chart, lab work & pertinent test results  Airway Mallampati: II  TM Distance: >3 FB Neck ROM: Full    Dental  (+) Teeth Intact, Dental Advisory Given   Pulmonary asthma , Current Smoker and Patient abstained from smoking.   Pulmonary exam normal breath sounds clear to auscultation       Cardiovascular negative cardio ROS Normal cardiovascular exam Rhythm:Regular Rate:Normal     Neuro/Psych negative neurological ROS     GI/Hepatic negative GI ROS, Neg liver ROS,,,  Endo/Other    Morbid obesity  Renal/GU negative Renal ROS     Musculoskeletal  (+) Arthritis ,  SEPTIC KNEE   Abdominal   Peds  Hematology  (+) Blood dyscrasia, anemia Plt 476k   Anesthesia Other Findings Day of surgery medications reviewed with the patient.  Reproductive/Obstetrics                             Anesthesia Physical Anesthesia Plan  ASA: 3  Anesthesia Plan: General   Post-op Pain Management: Regional block* and Tylenol PO (pre-op)*   Induction: Intravenous  PONV Risk Score and Plan: 2 and Midazolam, Dexamethasone and Ondansetron  Airway Management Planned: Oral ETT  Additional Equipment:   Intra-op Plan:   Post-operative Plan: Extubation in OR  Informed Consent: I have reviewed the patients History and Physical, chart, labs and discussed the procedure including the risks, benefits and alternatives for the proposed anesthesia with the patient or authorized representative who has indicated his/her understanding and acceptance.     Dental advisory given  Plan Discussed with: CRNA  Anesthesia Plan Comments:         Anesthesia Quick Evaluation

## 2023-04-14 NOTE — Anesthesia Procedure Notes (Signed)
Procedure Name: Intubation Date/Time: 04/14/2023 1:41 PM  Performed by: Sindy Guadeloupe, CRNAPre-anesthesia Checklist: Patient identified, Emergency Drugs available, Suction available, Patient being monitored and Timeout performed Patient Re-evaluated:Patient Re-evaluated prior to induction Oxygen Delivery Method: Circle system utilized Preoxygenation: Pre-oxygenation with 100% oxygen Induction Type: IV induction Ventilation: Mask ventilation without difficulty Laryngoscope Size: Mac and 4 Tube type: Oral Tube size: 7.0 mm Number of attempts: 1 Airway Equipment and Method: Stylet Placement Confirmation: ETT inserted through vocal cords under direct vision, positive ETCO2 and breath sounds checked- equal and bilateral Secured at: 22 cm Tube secured with: Tape Dental Injury: Teeth and Oropharynx as per pre-operative assessment

## 2023-04-14 NOTE — Discharge Instructions (Signed)

## 2023-04-14 NOTE — Op Note (Signed)
04/09/2023  7:36 AM  PATIENT:  Sharon Faulkner    PRE-OPERATIVE DIAGNOSIS:  RIGHT SEPTIC KNEE  POST-OPERATIVE DIAGNOSIS:  Same  PROCEDURE:  ARTHROSCOPY KNEE RIGHT  SURGEON:  Sheral Apley, MD  ASSISTANT: Levester Fresh, [name], he was present and scrubbed throughout the case, critical for completion in a timely fashion, and for retraction, instrumentation, and closure.   ANESTHESIA:   gen  PREOPERATIVE INDICATIONS:  Sharon Faulkner is a  47 y.o. female with a diagnosis of RIGHT SEPTIC KNEE who failed conservative measures and elected for surgical management.    The risks benefits and alternatives were discussed with the patient preoperatively including but not limited to the risks of infection, bleeding, nerve injury, cardiopulmonary complications, the need for revision surgery, among others, and the patient was willing to proceed.  OPERATIVE IMPLANTS: none  OPERATIVE FINDINGS: purulent fluid  BLOOD LOSS: min  COMPLICATIONS: none  TOURNIQUET TIME: none  OPERATIVE PROCEDURE:  Patient was identified in the preoperative holding area and site was marked by me She was transported to the operating theater and placed on the table in supine position taking care to pad all bony prominences. After a preincinduction time out anesthesia was induced. The right lower extremity was prepped and draped in normal sterile fashion and a pre-incision timeout was performed. She received ancef after culture for preoperative antibiotics.   I made an anterior medial and lateral arthroscopic portal in the right knee and inserted the arthroscope I then used a spinal needle localized and placed a superior lateral drain.  I thoroughly irrigated the knee throughout the case using a total of 12 L of saline  I used a shaver to perform a complete synovectomy of the knee and all debride all abscesses.  I examined chondral surface performing a chondroplasty of all 3 compartments she had lost near complete chondral  surfaces due to the chronic infection.  I performed a partial medial lateral meniscectomy debriding devitalized tissue there as well  After completion of this I removed arthroscopic equipment and closed her portals sterile dressing was applied  POST OPERATIVE PLAN: Weightbearing as tolerated further evaluation discussion of her MRI consider resection arthroplasty in the near future

## 2023-04-14 NOTE — Interval H&P Note (Signed)
History and Physical Interval Note:  04/14/2023 8:52 AM  Sharon Faulkner  has presented today for surgery, with the diagnosis of SEPTIC KNEE.  The various methods of treatment have been discussed with the patient and family. After consideration of risks, benefits and other options for treatment, the patient has consented to  Procedure(s): INCISION AND DRAINAGE RIGHT KNEE (Right) TOTAL KNEE ARTHROPLASTY (Right) as a surgical intervention.  The patient's history has been reviewed, patient examined, no change in status, stable for surgery.  I have reviewed the patient's chart and labs.  Questions were answered to the patient's satisfaction.     Sheral Apley

## 2023-04-14 NOTE — Anesthesia Procedure Notes (Signed)
Anesthesia Regional Block: Adductor canal block   Pre-Anesthetic Checklist: , timeout performed,  Correct Patient, Correct Site, Correct Laterality,  Correct Procedure, Correct Position, site marked,  Risks and benefits discussed,  Surgical consent,  Pre-op evaluation,  At surgeon's request and post-op pain management  Laterality: Right  Prep: chloraprep       Needles:  Injection technique: Single-shot  Needle Type: Echogenic Needle     Needle Length: 9cm  Needle Gauge: 21     Additional Needles:   Procedures:,,,, ultrasound used (permanent image in chart),,    Narrative:  Start time: 04/14/2023 1:15 PM End time: 04/14/2023 1:22 PM Injection made incrementally with aspirations every 5 mL.  Performed by: Personally  Anesthesiologist: Collene Schlichter, MD  Additional Notes: No pain on injection. No increased resistance to injection. Injection made in 5cc increments.  Good needle visualization.  Patient tolerated procedure well.

## 2023-04-14 NOTE — Progress Notes (Signed)
PHARMACY CONSULT NOTE FOR:  OUTPATIENT  PARENTERAL ANTIBIOTIC THERAPY (OPAT)  Indication: GBS R-septic knee/osteo Regimen: Rocephin 2g IV every 24 hours End date: 05/26/23 (6 weeks from OR 9/3)  IV antibiotic discharge orders are pended. To discharging provider:  please sign these orders via discharge navigator,  Select New Orders & click on the button choice - Manage This Unsigned Work.     Thank you for allowing pharmacy to be a part of this patient's care.  Georgina Pillion, PharmD, BCPS, BCIDP Infectious Diseases Clinical Pharmacist 04/16/2023 11:26 AM   **Pharmacist phone directory can now be found on [email or web address] ([email or web address]).  Listed under [facility].

## 2023-04-14 NOTE — Transfer of Care (Signed)
Immediate Anesthesia Transfer of Care Note  Patient: Sharon Faulkner  Procedure(s) Performed: Procedure(s): INCISION AND DRAINAGE RIGHT KNEE (Right) TOTAL KNEE ARTHROPLASTY (Right)  Patient Location: PACU  Anesthesia Type:General  Level of Consciousness:  sedated, patient cooperative and responds to stimulation  Airway & Oxygen Therapy:Patient Spontanous Breathing and Patient connected to face mask oxgen  Post-op Assessment:  Report given to PACU RN and Post -op Vital signs reviewed and stable  Post vital signs:  Reviewed and stable  Last Vitals:  Vitals:   04/14/23 1320 04/14/23 1325  BP: (!) 159/118   Pulse: (!) 102 82  Resp: 13 13  Temp:    SpO2: 100% 96%    Complications: No apparent anesthesia complications

## 2023-04-14 NOTE — TOC Progression Note (Signed)
Transition of Care [facility]) - Progression Note   Patient Details  Name: Sharon Faulkner MRN: [id number] Date of Birth: 07/17/1976  [facility] CM/SW Contact  Ewing Schlein, LCSW Phone Number: 04/14/2023, 3:20 PM  Clinical Narrative: CSW followed up with Pam with [facility] regarding home IV antibiotics. CSW made referral to Amy with [facility] to have the patient reviewed for charity [facility]. TOC awaiting update from [facility].    Expected Discharge Plan: Home w Home Health Services Barriers to Discharge: Continued Medical Work up, Inadequate or no insurance  Expected Discharge Plan and Services In-house Referral: Clinical Social Work Discharge Planning Services: Indigent Health Clinic, Medication Assistance, MATCH Program Post Acute Care Choice: Home Health Living arrangements for the past 2 months: Hotel/Motel  Social Determinants of Health (SDOH) Interventions SDOH Screenings   Housing: Patient Declined (04/09/2023)  Tobacco Use: High Risk (04/14/2023)   Readmission Risk Interventions     No data to display

## 2023-04-14 NOTE — Progress Notes (Signed)
PROGRESS NOTE  Sharon Faulkner  ZOX:[id number] DOB: [birth year] DOA: 04/08/2023 PCP: Patient, No Pcp Per   Brief Narrative: Patient is a 47 year old female with history of morbid obesity, asthma who presented with complaint of right knee swelling.  Orthopedics consulted, status post arthrocentesis.  Synovial fluid cultures showed strep agalactiae.  ID consulted.Currently on penicillin G.  Found to have extensive osteomyelitis, infected tissue in the knee joint that has lead to irreversible damage and almost complete cartilage degeneration.  Orthopedics closely following, planning for two-staged total knee arthroplasty with antibiotic placement.  Assessment & Plan:  Principal Problem:   Septic arthritis of knee, right (HCC) Active Problems:   Asthma   Chronic anemia   Arthritis, septic, knee (HCC)   Septic arthritis/osteomyelitis of right knee: Report of knee swelling/pain for a month.  No problem with ambulation at baseline.  Underwent arthrocentesis in the emergency department.  Arthrocentesis showed streptococcal agalactiae.  ID consulted.  No history of fever or chills. Currently on penicillin G.   MRI showed large joint effusion with high-grade synovial thickening and synovial enhancement, high-grade marrow edema .Found to have extensive osteomyelitis, infected tissue in the knee joint that has lead to irreversible damage and almost complete cartilage degeneration.  Orthopedics closely following, did arthoscopy, planning for two-stage total knee arthroplasty with antibiotic placement. Blood cultures have not shown any growth so far.  Continue pain management, supportive care. Planning for incision and drainage, total knee arthroplasty today  Chronic microcytic anemia: Hemoglobin in the range of 7.  She was given a unit of blood transfusion during this hospitalization.  Iron studies showed low iron, being given IV iron  History of asthma: Currently not in exacerbation  Morbid obesity: BMI of  46.2       DVT prophylaxis:enoxaparin (LOVENOX) injection 40 mg Start: 04/12/23 1000 SCDs Start: 04/09/23 1703 SCDs Start: 04/09/23 0146     Code Status: Full Code  Family Communication: None at bedside  Patient status:Inpatient  Patient is from :home  Anticipated discharge WJ:XBJY versus SNF  Estimated DC date: After clearance from ID/orthopedics   Consultants: ID, orthopedics  Procedures: Arthrocentesis,arthoscopy  Antimicrobials:  Anti-infectives (From admission, onward)    Start     Dose/Rate Route Frequency Ordered Stop   04/12/23 1200  penicillin G potassium 12 Million Units in dextrose 5 % 500 mL CONTINUOUS infusion        12 Million Units 41.7 mL/hr over 12 Hours Intravenous Every 12 hours 04/12/23 1105 05/21/23 2359   04/10/23 2000  penicillin G potassium 12 Million Units in dextrose 5 % 500 mL CONTINUOUS infusion  Status:  Discontinued        12 Million Units 41.7 mL/hr over 12 Hours Intravenous Every 12 hours 04/10/23 1850 04/12/23 1107   04/10/23 0600  vancomycin (VANCOCIN) IVPB 1000 mg/200 mL premix  Status:  Discontinued        1,000 mg 200 mL/hr over 60 Minutes Intravenous Every 12 hours 04/09/23 1434 04/10/23 1850   04/09/23 1800  vancomycin (VANCOREADY) IVPB 2000 mg/400 mL        2,000 mg 200 mL/hr over 120 Minutes Intravenous  Once 04/09/23 1434 04/09/23 2300   04/09/23 1130  ceFAZolin (ANCEF) IVPB 2g/100 mL premix        2 g 200 mL/hr over 30 Minutes Intravenous On call to O.R. 04/09/23 1044 04/09/23 1531   04/08/23 2330  ceFAZolin (ANCEF) IVPB 2g/100 mL premix        2 g 200 mL/hr over 30 Minutes  Intravenous  Once 04/08/23 2327 04/09/23 0036       Subjective: Patient seen and the bedside today.  Lying in bed.  Complains of pain on the right knee.  Awaiting surgery  Objective: Vitals:   04/13/23 0612 04/13/23 1404 04/13/23 2219 04/14/23 0514  BP: (!) 140/96 (!) 141/92 127/80 134/82  Pulse: 85 81 85 78  Resp: 18 18 18 18   Temp: 98.3 F  (36.8 C) 98.2 F (36.8 C) 98.5 F (36.9 C) 98 F (36.7 C)  TempSrc: Oral Oral Oral Oral  SpO2: 98% 99% 97% 98%  Weight:      Height:        Intake/Output Summary (Last 24 hours) at 04/14/2023 1102 Last data filed at 04/14/2023 0810 Gross per 24 hour  Intake 1559.33 ml  Output 1850 ml  Net -290.67 ml   Filed Weights   04/09/23 1309 04/12/23 0425 04/13/23 0500  Weight: 122.5 kg 129.7 kg 126.1 kg    Examination:  General exam: Overall comfortable, not in distress, morbidly obese HEENT: PERRL Respiratory system:  no wheezes or crackles  Cardiovascular system: S1 & S2 heard, RRR.  Gastrointestinal system: Abdomen is nondistended, soft and nontender. Central nervous system: Alert and oriented Extremities: No edema, no clubbing ,no cyanosis, right knee wrapped with dressing Skin: No rashes, no ulcers,no icterus       Data Reviewed: I have personally reviewed following labs and imaging studies  CBC: Recent Labs  Lab 04/08/23 1515 04/09/23 0624 04/10/23 0632 04/11/23 0320 04/12/23 0348 04/13/23 0332 04/14/23 0253  WBC 7.6   < > 9.3 6.6 7.4 7.7 8.4  NEUTROABS 4.9  --   --   --   --   --   --   HGB 8.9*   < > 7.2* 6.9* 7.5* 7.4* 7.6*  HCT 31.1*   < > 25.6* 24.6* 26.9* 27.1* 27.6*  MCV 67.0*   < > 67.4* 68.3* 69.0* 69.5* 70.1*  PLT 677*   < > 565* 507* PLATELET CLUMPS NOTED ON SMEAR, UNABLE TO ESTIMATE 538* 476*   < > = values in this interval not displayed.   Basic Metabolic Panel: Recent Labs  Lab 04/09/23 0624 04/10/23 0632 04/11/23 0320 04/12/23 0348 04/13/23 0332  NA 139 137 139 139 137  K 3.9 4.1 3.8 3.9 4.3  CL 106 103 104 103 107  CO2 24 25 26 27 25   GLUCOSE 94 127* 104* 114* 104*  BUN 12 11 10 13 16   CREATININE 0.75 0.64 0.60 0.61 0.52  CALCIUM 8.8* 8.9 8.9 8.7* 8.8*  MG  --  1.7 1.6* 2.0  --      Recent Results (from the past 240 hour(s))  Body fluid culture w Gram Stain     Status: None   Collection Time: 04/08/23  6:34 PM   Specimen: KNEE;  Body Fluid  Result Value Ref Range Status   Specimen Description   Final    KNEE Performed at [facility], [address] [place], [place] [address]    Special Requests   Final    NONE Performed at [facility], [address] [place], [place] [address]    Gram Stain   Final    ABUNDANT WBC PRESENT, PREDOMINANTLY PMN RARE GRAM POSITIVE COCCI IN PAIRS IN CHAINS Gram Stain Report Called to,Read Back By and Verified With: RN . A. Normand Sloop [id number] @ 2236 FH    Culture   Final    FEW GROUP  B STREP(S.AGALACTIAE)ISOLATED TESTING AGAINST S. AGALACTIAE NOT ROUTINELY PERFORMED DUE TO PREDICTABILITY OF AMP/PEN/VAN SUSCEPTIBILITY. Performed at [facility], [address], [place], [place] [address]    Report Status 04/10/2023 FINAL  Final  Culture, blood (Routine X 2) w Reflex to ID Panel     Status: None   Collection Time: 04/09/23  6:19 AM   Specimen: BLOOD RIGHT HAND  Result Value Ref Range Status   Specimen Description BLOOD RIGHT HAND  Final   Special Requests   Final    BOTTLES DRAWN AEROBIC ONLY Blood Culture results may not be optimal due to an inadequate volume of blood received in culture bottles   Culture   Final    NO GROWTH 5 DAYS Performed at [facility], [address] [place], [place] [address]    Report Status 04/14/2023 FINAL  Final  Culture, blood (Routine X 2) w Reflex to ID Panel     Status: None   Collection Time: 04/09/23  8:06 AM   Specimen: BLOOD  Result Value Ref Range Status   Specimen Description BLOOD SITE NOT SPECIFIED  Final   Special Requests   Final    BOTTLES DRAWN AEROBIC AND ANAEROBIC Blood Culture results may not be optimal due to an inadequate volume of blood received in culture bottles   Culture   Final    NO GROWTH 5 DAYS Performed at [facility], [address], [place], [place] [address]    Report Status 04/14/2023 FINAL  Final  Aerobic/Anaerobic Culture w Gram Stain (surgical/deep wound)      Status: None (Preliminary result)   Collection Time: 04/09/23  3:28 PM   Specimen: Abscess  Result Value Ref Range Status   Specimen Description ABSCESS  Final   Special Requests right knee  Final   Gram Stain   Final    ABUNDANT WBC PRESENT, PREDOMINANTLY PMN NO ORGANISMS SEEN Performed at [facility], [address] [place], [place] [address]    Culture   Final    MODERATE GROUP B STREP(S.AGALACTIAE)ISOLATED TESTING AGAINST S. AGALACTIAE NOT ROUTINELY PERFORMED DUE TO PREDICTABILITY OF AMP/PEN/VAN SUSCEPTIBILITY. NO ANAEROBES ISOLATED; CULTURE IN PROGRESS FOR 5 DAYS    Report Status PENDING  Incomplete     Radiology Studies: No results found.  Scheduled Meds:  sodium chloride   Intravenous Once   docusate sodium  100 mg Oral BID   enoxaparin (LOVENOX) injection  40 mg Subcutaneous Daily   [START ON 04/16/2023] ferrous sulfate  325 mg Oral Daily   pantoprazole  40 mg Oral Daily   sodium chloride flush  3 mL Intravenous Q12H   Continuous Infusions:  sodium chloride     ferric gluconate (FERRLECIT) IVPB 250 mg (04/13/23 1748)   penicillin G potassium 12 Million Units in dextrose 5 % 500 mL CONTINUOUS infusion 41.7 mL/hr at 04/14/23 0810     LOS: 5 days   Burnadette Pop, MD [facility] P9/10/2022, 11:02 AM

## 2023-04-14 NOTE — Progress Notes (Signed)
OT Cancellation Note  Patient Details Name: Sharon Faulkner MRN: [id number] DOB: [birth year]   Cancelled Treatment:    Reason Eval/Treat Not Completed: Patient not medically ready. Spoke with RN who expressed that pt will be going to surgery today for knee. OT will hold and likely re-evaluate once cleared by surgery with updated precautions.   Theodoro Clock 04/14/2023, 9:48 AM

## 2023-04-14 NOTE — Anesthesia Postprocedure Evaluation (Signed)
Anesthesia Post Note  Patient: Marlyce Huge  Procedure(s) Performed: INCISION AND DRAINAGE RIGHT KNEE (Right: Knee) TOTAL KNEE ARTHROPLASTY (Right: Knee)     Patient location during evaluation: PACU Anesthesia Type: General Level of consciousness: awake and alert Pain management: pain level controlled Vital Signs Assessment: post-procedure vital signs reviewed and stable Respiratory status: spontaneous breathing, nonlabored ventilation, respiratory function stable and patient connected to nasal cannula oxygen Cardiovascular status: blood pressure returned to baseline and stable Postop Assessment: no apparent nausea or vomiting Anesthetic complications: no Comments: Hgb 7.5 in PACU. Vitals stable. Pt discharged from PACU in stable condition.   No notable events documented.  Last Vitals:  Vitals:   04/14/23 1630 04/14/23 1645  BP: (!) 148/116 (!) 148/105  Pulse: (!) 101 93  Resp: 14 18  Temp:    SpO2: 99% 99%    Last Pain:  Vitals:   04/14/23 1645  TempSrc:   PainSc: 10-Worst pain ever                 Beatrix Breece L Brooklin Rieger

## 2023-04-14 NOTE — Progress Notes (Signed)
Orthopedic Tech Progress Note Patient Details:  Faten Dilling Aug 28, 1975 [id number]  Ortho Devices Type of Ortho Device: Bone foam zero knee Ortho Device/Splint Location: right Ortho Device/Splint Interventions: Ordered, Application, Adjustment   Post Interventions Patient Tolerated: Poor Instructions Provided: Adjustment of device, Care of device  [name] 04/14/2023, 4:27 PM

## 2023-04-15 ENCOUNTER — Other Ambulatory Visit: Payer: Self-pay

## 2023-04-15 DIAGNOSIS — D649 Anemia, unspecified: Secondary | ICD-10-CM

## 2023-04-15 DIAGNOSIS — J45909 Unspecified asthma, uncomplicated: Secondary | ICD-10-CM

## 2023-04-15 DIAGNOSIS — M00261 Other streptococcal arthritis, right knee: Principal | ICD-10-CM

## 2023-04-15 LAB — CBC
HCT: 23.6 % — ABNORMAL LOW (ref 36.0–46.0)
Hemoglobin: 6.6 g/dL — CL (ref 12.0–15.0)
MCH: 19.6 pg — ABNORMAL LOW (ref 26.0–34.0)
MCHC: 28 g/dL — ABNORMAL LOW (ref 30.0–36.0)
MCV: 70.2 fL — ABNORMAL LOW (ref 80.0–100.0)
Platelets: 541 10*3/uL — ABNORMAL HIGH (ref 150–400)
RBC: 3.36 MIL/uL — ABNORMAL LOW (ref 3.87–5.11)
RDW: 19.5 % — ABNORMAL HIGH (ref 11.5–15.5)
WBC: 14 10*3/uL — ABNORMAL HIGH (ref 4.0–10.5)
nRBC: 0 % (ref 0.0–0.2)

## 2023-04-15 LAB — CBC WITH DIFFERENTIAL/PLATELET
Abs Immature Granulocytes: 0.15 10*3/uL — ABNORMAL HIGH (ref 0.00–0.07)
Basophils Absolute: 0 10*3/uL (ref 0.0–0.1)
Basophils Relative: 0 %
Eosinophils Absolute: 0 10*3/uL (ref 0.0–0.5)
Eosinophils Relative: 0 %
HCT: 23.8 % — ABNORMAL LOW (ref 36.0–46.0)
Hemoglobin: 6.6 g/dL — CL (ref 12.0–15.0)
Immature Granulocytes: 1 %
Lymphocytes Relative: 6 %
Lymphs Abs: 0.7 10*3/uL (ref 0.7–4.0)
MCH: 19.5 pg — ABNORMAL LOW (ref 26.0–34.0)
MCHC: 27.7 g/dL — ABNORMAL LOW (ref 30.0–36.0)
MCV: 70.4 fL — ABNORMAL LOW (ref 80.0–100.0)
Monocytes Absolute: 0.4 10*3/uL (ref 0.1–1.0)
Monocytes Relative: 3 %
Neutro Abs: 10.5 10*3/uL — ABNORMAL HIGH (ref 1.7–7.7)
Neutrophils Relative %: 90 %
Platelets: 453 10*3/uL — ABNORMAL HIGH (ref 150–400)
RBC: 3.38 MIL/uL — ABNORMAL LOW (ref 3.87–5.11)
RDW: 20.5 % — ABNORMAL HIGH (ref 11.5–15.5)
WBC: 11.7 10*3/uL — ABNORMAL HIGH (ref 4.0–10.5)
nRBC: 0.2 % (ref 0.0–0.2)

## 2023-04-15 LAB — BASIC METABOLIC PANEL
Anion gap: 10 (ref 5–15)
BUN: 9 mg/dL (ref 6–20)
CO2: 25 mmol/L (ref 22–32)
Calcium: 8.9 mg/dL (ref 8.9–10.3)
Chloride: 100 mmol/L (ref 98–111)
Creatinine, Ser: 0.57 mg/dL (ref 0.44–1.00)
GFR, Estimated: >60 mL/min (ref >60)
Glucose, Bld: 125 mg/dL — ABNORMAL HIGH (ref 70–99)
Potassium: 4 mmol/L (ref 3.5–5.1)
Sodium: 135 mmol/L (ref 135–145)

## 2023-04-15 LAB — NO BLOOD PRODUCTS

## 2023-04-15 LAB — PREPARE RBC (CROSSMATCH)

## 2023-04-15 MED ORDER — PREDNISONE 10 MG (21) PO TBPK
20.0000 mg | ORAL_TABLET | Freq: Every evening | ORAL | Status: AC
  Administered 2023-04-15: 20 mg via ORAL

## 2023-04-15 MED ORDER — PREDNISONE 10 MG (21) PO TBPK
10.0000 mg | ORAL_TABLET | ORAL | Status: AC
  Administered 2023-04-15: 10 mg via ORAL

## 2023-04-15 MED ORDER — CELECOXIB 200 MG PO CAPS
200.0000 mg | ORAL_CAPSULE | Freq: Two times a day (BID) | ORAL | Status: DC
  Administered 2023-04-15 – 2023-04-18 (×7): 200 mg via ORAL
  Filled 2023-04-15 (×7): qty 1

## 2023-04-15 MED ORDER — PREDNISONE 10 MG (21) PO TBPK
10.0000 mg | ORAL_TABLET | Freq: Four times a day (QID) | ORAL | Status: DC
  Administered 2023-04-17 – 2023-04-19 (×8): 10 mg via ORAL

## 2023-04-15 MED ORDER — SODIUM CHLORIDE 0.9% IV SOLUTION: Freq: Once | INTRAVENOUS | Status: DC

## 2023-04-15 MED ORDER — HYDROMORPHONE HCL 1 MG/ML IJ SOLN
0.5000 mg | INTRAMUSCULAR | Status: DC | PRN
  Administered 2023-04-15 – 2023-04-19 (×14): 1 mg via INTRAVENOUS
  Filled 2023-04-15 (×15): qty 1

## 2023-04-15 MED ORDER — PREDNISONE 10 MG (21) PO TBPK
20.0000 mg | ORAL_TABLET | Freq: Every morning | ORAL | Status: AC
  Administered 2023-04-15: 20 mg via ORAL
  Filled 2023-04-15: qty 21

## 2023-04-15 MED ORDER — PREDNISONE 10 MG (21) PO TBPK
10.0000 mg | ORAL_TABLET | Freq: Three times a day (TID) | ORAL | Status: AC
  Administered 2023-04-16 (×3): 10 mg via ORAL

## 2023-04-15 MED ORDER — PREDNISONE 10 MG (21) PO TBPK
20.0000 mg | ORAL_TABLET | Freq: Every evening | ORAL | Status: AC
  Administered 2023-04-16: 20 mg via ORAL

## 2023-04-15 MED ORDER — CHLORHEXIDINE GLUCONATE CLOTH 2 % EX PADS
6.0000 | MEDICATED_PAD | Freq: Every day | CUTANEOUS | Status: DC
  Administered 2023-04-15 – 2023-04-19 (×4): 6 via TOPICAL

## 2023-04-15 NOTE — Evaluation (Signed)
Physical Therapy Evaluation Patient Details Name: Sharon Faulkner MRN: [id number] DOB: 26-Feb-1976 Today's Date: 04/15/2023  History of Present Illness  47 yo female, presents to [facility] ED 8/28 with R knee and leg pain and swelling that has been present since 7/28. Prior ED visit 7/31 Xray clear of fx but showed Effusion. Unable to draw any fluid then 8/28 R knee aspirated with small volume of purulent fluid attained. Pt transferred to [facility] S/p 8/29 R knee arthroscopic I&D which revealed chronic infection and component of osteomyelitis. s/p I&D and TKA 04/14/23.  PMH: Asthma and PE not on anticoagulation.  Clinical Impression  Pt is s/p TKA resulting in the deficits listed below (see PT Problem List). Pt reports she's in severe pain at rest in the bed and that the pain medicine is not providing relief. She declined mobility 2* pain.  Initiated instruction in TKA HEP, demonstrating on LLE.  Pt will benefit from acute skilled PT to increase their independence and safety with mobility to allow discharge.          If plan is discharge home, recommend the following: A lot of help with walking and/or transfers;A lot of help with bathing/dressing/bathroom;Assistance with cooking/housework;Assist for transportation;Help with stairs or ramp for entrance   Can travel by private vehicle        Equipment Recommendations Rolling walker (2 wheels)  Recommendations for Other Services       Functional Status Assessment Patient has had a recent decline in their functional status and demonstrates the ability to make significant improvements in function in a reasonable and predictable amount of time.     Precautions / Restrictions Precautions Precautions: Fall;Knee Precaution Booklet Issued: Yes (comment) Precaution Comments: reviewed no pillow under knee Restrictions Weight Bearing Restrictions: No RLE Weight Bearing: Weight bearing as tolerated      Mobility  Bed Mobility               General  bed mobility comments: pt stated pain is too high to attempt mobility    Transfers                        Ambulation/Gait                  Stairs            Wheelchair Mobility     Tilt Bed    Modified Rankin (Stroke Patients Only)       Balance                                             Pertinent Vitals/Pain Pain Assessment Pain Score: 10-Worst pain ever Pain Location: R knee Pain Descriptors / Indicators: Aching, Discomfort, Constant, Guarding Pain Intervention(s): Limited activity within patient's tolerance, Monitored during session, Premedicated before session, Ice applied    Home Living Family/patient expects to be discharged to:: Other (Comment) (hotel) Living Arrangements: Parent (care giver for her blind [facility] mother) Available Help at Discharge: Family;Available PRN/intermittently Type of Home: Other(Comment) ([employer] Room)           Home Equipment: Wheelchair - manual;Tub bench;Shower seat Additional Comments: sharing wheelchair with her mother    Prior Function Prior Level of Function : Needs assist             Mobility Comments: transferring to mother's wheelchair for  mobilization in room ADLs Comments: uses [employer], family for shopping, independent with bathing and dressing     Extremity/Trunk Assessment   Upper Extremity Assessment Upper Extremity Assessment: Defer to OT evaluation    Lower Extremity Assessment RLE Deficits / Details: pt reported pain is too high at rest to attempt any movement of RLE RLE: Unable to fully assess due to pain       Communication   Communication Communication: No apparent difficulties  Cognition Arousal: Alert Behavior During Therapy: WFL for tasks assessed/performed Overall Cognitive Status: Within Functional Limits for tasks assessed                                          General Comments      Exercises Total Joint  Exercises Ankle Circles/Pumps: AROM, Both, 10 reps, Supine Quad Sets: AROM, Left, Supine (x 3 reps, demonstrated on LLE as pt couldn't tolerate doing it on RLE 2* pain) Heel Slides: Limitations, Right Heel Slides Limitations: demonstrated technique using gait belt looped on R foot, pt did not attempt heel slide on R 2* pain but stated she's attempt this exercise later independently   Assessment/Plan    PT Assessment Patient needs continued PT services  PT Problem List Decreased range of motion;Decreased activity tolerance;Decreased balance;Decreased mobility;Decreased coordination;Pain       PT Treatment Interventions DME instruction;Gait training;Functional mobility training;Therapeutic activities;Therapeutic exercise;Balance training;Cognitive remediation;Patient/family education    PT Goals (Current goals can be found in the Care Plan section)  Acute Rehab PT Goals Patient Stated Goal: decrease pain PT Goal Formulation: With patient Time For Goal Achievement: 04/29/23 Potential to Achieve Goals: Fair    Frequency 7X/week     Co-evaluation               AM-PAC PT "6 Clicks" Mobility  Outcome Measure Help needed turning from your back to your side while in a flat bed without using bedrails?: Total Help needed moving from lying on your back to sitting on the side of a flat bed without using bedrails?: Total Help needed moving to and from a bed to a chair (including a wheelchair)?: Total Help needed standing up from a chair using your arms (e.g., wheelchair or bedside chair)?: Total Help needed to walk in hospital room?: Total Help needed climbing 3-5 steps with a railing? : Total 6 Click Score: 6    End of Session Equipment Utilized During Treatment: Gait belt Activity Tolerance: Patient limited by pain Patient left: in bed;with call bell/phone within reach;with bed alarm set Nurse Communication: Mobility status PT Visit Diagnosis: Unsteadiness on feet (R26.81);Other  abnormalities of gait and mobility (R26.89);Muscle weakness (generalized) (M62.81);Difficulty in walking, not elsewhere classified (R26.2);Pain Pain - Right/Left: Right Pain - part of body: Knee    Time: 8295-6213 PT Time Calculation (min) (ACUTE ONLY): 11 min   Charges:   PT Evaluation $PT Re-evaluation: 1 Re-eval   PT General Charges $$ ACUTE PT VISIT: 1 Visit         Tamala Ser PT 04/15/2023  Acute Rehabilitation Services  Office [phone]

## 2023-04-15 NOTE — Progress Notes (Signed)
Orthopedic Tech Progress Note Patient Details:  Sharon Faulkner 21-Apr-1976 [id number] CPM unable to be applied at this time due to patient's pain level.  CPM Right Knee Right Knee Flexion (Degrees): 60 Right Knee Extension (Degrees): 0  Post Interventions Patient Tolerated: Unable to use device properly Instructions Provided: Adjustment of device, Care of device  Sharon Faulkner E Sharon Faulkner 04/15/2023, 12:11 PM

## 2023-04-15 NOTE — Hospital Course (Addendum)
The patient is a 47 year old morbidly obese African-American female with a past medical history significant for but not limited to asthma as well as other comorbidities who presented with right knee swelling and pain.  Orthopedic surgery was consulted and she is status post arthrocentesis.  Synovial fluid culture showed strep agalactiae and so ID was consulted and was placed on penicillin G.  She was found to have extensive osteomyelitis and infected tissue in the right knee joint that lead to irreversible damage almost complete cartilage degeneration.  Orthopedic surgery is following closely and plan for two-stage total knee arthroplasty with antibiotic placement and she underwent incision and drainage and also had a total knee arthroplasty is postoperative day 4.    ID is recommending a prolonged course of antibiotics.  Pain is relatively still uncontrolled and will need further adjustments and is now improving fully but she still received some IV Dilaudid.  Will need to transition her off IVs to attempt to have pain control with oral so she can be discharged.  Hospitalization has been complicated by acute on chronic anemia however patient had refused blood transfusion initially but now is agreeable and is status post 2 units PRBCs. If hemoglobin stabilizes and pain is relatively well-controlled she can be discharged the next 24 to 48 hours.  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  No problem with ambulation at baseline.   -Underwent arthrocentesis in the emergency department.  Arthrocentesis showed streptococcal agalactiae.   -ID consulted.  No history of fever or chills. Currently on Penicillin G for antibiotic management however now infectious diseases are planning to change her to IV ceftriaxone on 04/16/2023 to get coverage for home and will have home teaching administration with a 6-week course duration for  antibiotics -WBC Trend and still remains slightly elevated: Recent Labs  Lab 04/13/23 0332 04/14/23 0253 04/15/23 0311 04/15/23 1803 04/16/23 0322 04/17/23 1007 04/18/23 0552  WBC 7.7 8.4 14.0* 11.7* 10.7* 11.3* 12.0*  -MRI showed large joint effusion with high-grade synovial thickening and synovial enhancement, high-grade marrow edema . -Found to have extensive osteomyelitis, infected tissue in the knee joint that has lead to irreversible damage and almost complete cartilage degeneration.   -Orthopedics closely following, did arthoscopy, planning for two-stage total knee arthroplasty with antibiotic placement. -Blood cultures have not shown any growth so far.  Continue pain management, supportive care. -Patient underwent a arthroscopy of her right knee on 04/14/2023 as well as incision and drainage of the right knee with total knee arthroplasty done by Dr. Margarita Rana. -Continues to have quite a lot of pain which is severe and has not mobilized yet. -Orthopedic surgery has adjusted her pain regimen and adjusted her hydromorphone to every 2 as needed and added Celebrex as well as a Medrol Dosepak.  Taper initiated.  Currently she is on acetaminophen at 1000 mg p.o. every 6 scheduled; IV hydromorphone 0.5 to 1 mg every 2 as needed for severe pain as well as p.o. oxycodone 5 to 10 mg every 4 as needed for moderate pain and 10 to 50 mg every 4 as needed severe pain -We will discontinue her Celecoxib 200 mg given her anemia -Patient was advised that today tomorrow will be the top of the day since her TKA since her nerve block will wear off; the weightbearing status is weightbearing as tolerated right lower extremity and are recommending leaving the dressings intact until follow-up and reinforcing dressings as  needed -Currently holding her Lovenox 40 mg daily until her hemoglobin increases but recommended SCDs and ambulation -Patient is postoperative day 4 with incision and drainage implant for  stability in the infectious diseases are recommending 6 weeks of IV penicillin versus cefazolin per awaiting to see with charity care and can be approved; PICC line to be placed and is pending  -Patient also received Tranexamic acid 1000 mg IV x 1 in the PACU -Patient having a CPM to be placed however was unable to be applied due to her pain level. -Orthopedic Surgery recommending advancing her diet and recommending upper therapy and encouraging her to present to the pain and continue with incentive spirometry as well as elevating the extremity and applying ice. -The transition of care team is arranging home health for the patient and the caseworker delivered a charity rolling walker to the patient's room; Pam with [facility] is working on antibiotics for discharge but the patient will need a allergy for the home health RN.  PT OT continue to work with the patient and she will need a rolling walker and home health and charity home health is being set up for the patient  History of Asthma -Currently not in exacerbation -Continue to Monitor respiratory status carefully  ABLA superimposed on Chronic Microcytic Anemia -Hgb/Hct Trend: Recent Labs  Lab 04/14/23 1703 04/15/23 0311 04/15/23 1803 04/16/23 0322 04/16/23 1946 04/17/23 1007 04/18/23 0552  HGB 7.5* 6.6* 6.6* 6.4* 7.1* 6.8* 8.1*  HCT 22.0* 23.6* 23.8* 23.1* 24.5* 23.8* 28.0*  MCV  --  70.2* 70.4* 71.7*  --  74.6* 75.7*  -Iron Panel done and showed Iron Level of 24, UIBC of 350, TIBC of 374, and Saturation Ratios of 6% -S/p 2 Units of pRBC's -Repeat CBC this Evening to assess stability -Continue to monitor for signs and symptoms of bleeding; patient is now on her period as well -Repeat CBC in the a.m.  Thrombocytosis, improved -Likely reactive in the setting of her septic knee -Platelet Count Trend: Recent Labs  Lab 04/13/23 0332 04/14/23 0253 04/15/23 0311 04/15/23 1803 04/16/23 0322 04/17/23 1007 04/18/23 0552  PLT 538*  476* 541* 453* 404* 403* 427*  -Continue to monitor and repeat CBC in a.m.  Hypoalbuminemia -Patient's Albumin Trend: Recent Labs  Lab 04/10/23 0632 04/11/23 0320 04/12/23 0348 04/13/23 0332 04/16/23 0322 04/17/23 1007 04/18/23 0552  ALBUMIN 2.5* 2.4* 2.9* 2.9* 2.8* 2.8* 3.0*  -Continue to Monitor and Trend and repeat CMP in the AM  Morbid Obesity -Complicates overall prognosis and care -Estimated body mass index is 52.03 kg/m as calculated from the following:   Height as of this encounter: 5\' 4"  (1.626 m).   Weight as of this encounter: 137.5 kg.  -Weight Loss and Dietary Counseling given

## 2023-04-15 NOTE — Evaluation (Signed)
Occupational Therapy Evaluation Patient Details Name: Sharon Faulkner MRN: [id number] DOB: May 19, 1976 Today's Date: 04/15/2023   History of Present Illness 47 yo female, presents to [facility] ED 8/28 with R knee and leg pain and swelling that has been present since 7/28. Prior ED visit 7/31 Xray clear of fx but showed Effusion. Unable to draw any fluid then 8/28 R knee aspirated with small volume of purulent fluid attained. Pt transferred to [facility] S/p 8/29 R knee arthroscopic I&D which revealed chronic infection and component of osteomyelitis. s/p I&D and TKA 04/14/23.  PMH: Asthma and PE not on anticoagulation.   Clinical Impression   Pt was functioning from a w/c level, but independent in self care in the month leading up to admission . She and her disabled mom, who cannot provide physical assist, are staying temporarily in a handicap accessible motel. Pt presents with R knee pain and intolerance of weight through her R LE. She stands with CGA during ADLs. Provided and instructed in use of AE for LB bathing and dressing. She needs set up to max assist for ADLs. Will follow acutely. Do not anticipate pt will need post acute OT.       If plan is discharge home, recommend the following: Assistance with cooking/housework;Assist for transportation    Functional Status Assessment  Patient has had a recent decline in their functional status and demonstrates the ability to make significant improvements in function in a reasonable and predictable amount of time.  Equipment Recommendations  BSC/3in1 (bariatric)    Recommendations for Other Services       Precautions / Restrictions Precautions Precautions: Fall;Knee Precaution Booklet Issued: Yes (comment) Precaution Comments: reviewed no pillow under knee Restrictions Weight Bearing Restrictions: No RLE Weight Bearing: Weight bearing as tolerated      Mobility Bed Mobility               General bed mobility comments: pt received in chair     Transfers Overall transfer level: Needs assistance Equipment used: Rolling walker (2 wheels) Transfers: Sit to/from Stand Sit to Stand: Contact guard assist, From elevated surface           General transfer comment: not placing weight through R LE, assist to kick R LE out during stand to sit      Balance Overall balance assessment: Needs assistance   Sitting balance-Leahy Scale: Good     Standing balance support: During functional activity, Reliant on assistive device for balance Standing balance-Leahy Scale: Good Standing balance comment: can stand with one hand stabilizing                           ADL either performed or assessed with clinical judgement   ADL Overall ADL's : Needs assistance/impaired Eating/Feeding: Independent;Sitting   Grooming: Wash/dry hands;Wash/dry face;Oral care;Sitting;Set up   Upper Body Bathing: Set up;Sitting   Lower Body Bathing: Minimal assistance;Sit to/from stand Lower Body Bathing Details (indicate cue type and reason): educated in use of reacher and long handled bath sponge Upper Body Dressing : Set up;Sitting   Lower Body Dressing: Moderate assistance;Sit to/from stand Lower Body Dressing Details (indicate cue type and reason): educated in use of reacher, long handled shoe horn and to dress R leg first Toilet Transfer: Contact guard assist   Toileting- Clothing Manipulation and Hygiene: Moderate assistance;Sit to/from stand Toileting - Clothing Manipulation Details (indicate cue type and reason): assist to manage depends, pt able to perform pericare in standing  with CGA             Vision Baseline Vision/History: 0 No visual deficits Patient Visual Report: No change from baseline       Perception         Praxis         Pertinent Vitals/Pain Pain Assessment Pain Assessment: Faces Faces Pain Scale: Hurts even more Pain Location: R knee Pain Descriptors / Indicators: Aching, Discomfort, Constant,  Guarding Pain Intervention(s): Premedicated before session, Monitored during session, Ice applied     Extremity/Trunk Assessment Upper Extremity Assessment Upper Extremity Assessment: Overall WFL for tasks assessed   Lower Extremity Assessment Lower Extremity Assessment: Defer to PT evaluation   Cervical / Trunk Assessment Cervical / Trunk Assessment: Normal   Communication Communication Communication: No apparent difficulties   Cognition Arousal: Alert Behavior During Therapy: WFL for tasks assessed/performed Overall Cognitive Status: Within Functional Limits for tasks assessed                                       General Comments       Exercises     Shoulder Instructions      Home Living Family/patient expects to be discharged to:: Other (Comment) (motel) Living Arrangements: Parent (mom cannot physically assist) Available Help at Discharge: Family;Available PRN/intermittently;Available 24 hours/day Type of Home: Other(Comment) (handicap accessible motel room)                       Home Equipment: Wheelchair - manual;Tub bench;Shower seat   Additional Comments: sharing wheelchair with her mother      Prior Functioning/Environment Prior Level of Function : Needs assist             Mobility Comments: transferring to mother's wheelchair for mobilization in room ADLs Comments: uses [employer], family for shopping, independent with bathing and dressing        OT Problem List: Impaired balance (sitting and/or standing);Decreased activity tolerance;Pain      OT Treatment/Interventions:      OT Goals(Current goals can be found in the care plan section) Acute Rehab OT Goals OT Goal Formulation: With patient Time For Goal Achievement: 04/29/23 Potential to Achieve Goals: Good ADL Goals Pt Will Perform Grooming: with modified independence;sitting;standing Pt Will Perform Lower Body Bathing: with modified independence;sit to/from  stand Pt Will Perform Lower Body Dressing: with modified independence;sit to/from stand Pt Will Transfer to Toilet: with modified independence;ambulating;bedside commode Pt Will Perform Toileting - Clothing Manipulation and hygiene: with modified independence;sit to/from stand  OT Frequency: Min 1X/week    Co-evaluation              AM-PAC OT "6 Clicks" Daily Activity     Outcome Measure Help from another person eating meals?: None Help from another person taking care of personal grooming?: A Little Help from another person toileting, which includes using toliet, bedpan, or urinal?: A Lot Help from another person bathing (including washing, rinsing, drying)?: A Lot Help from another person to put on and taking off regular upper body clothing?: A Little Help from another person to put on and taking off regular lower body clothing?: A Lot 6 Click Score: 16   End of Session Equipment Utilized During Treatment: Rolling walker (2 wheels) Nurse Communication: Other (comment) (needs purewick set up)  Activity Tolerance: Patient tolerated treatment well Patient left: in chair;with call bell/phone within reach  OT Visit Diagnosis: Unsteadiness on feet (R26.81);Other abnormalities of gait and mobility (R26.89);Pain Pain - Right/Left: Right Pain - part of body: Knee                Time: 1610-9604 OT Time Calculation (min): 48 min Charges:  OT General Charges $OT Visit: 1 Visit OT Evaluation $OT Eval Moderate Complexity: 1 Mod OT Treatments $Self Care/Home Management : 23-37 mins  Berna Spare, OTR/L Acute Rehabilitation Services Office: [phone]   Evern Bio 04/15/2023, 4:05 PM

## 2023-04-15 NOTE — Plan of Care (Signed)

## 2023-04-15 NOTE — Progress Notes (Signed)
[facility]    Date of Admission:  04/08/2023   Total days of antibiotics 8   ID: Sharon Faulkner is a 47 y.o. female with group b strep Principal Problem:   Septic arthritis of knee, right (HCC) Active Problems:   Asthma   Chronic anemia   Arthritis, septic, knee (HCC)    Subjective: Afebrile. Still having pain to left knee from recent I x D yesterday  Medications:   sodium chloride   Intravenous Once   sodium chloride   Intravenous Once   acetaminophen  1,000 mg Oral Q6H   celecoxib  200 mg Oral BID   Chlorhexidine Gluconate Cloth  6 each Topical Daily   docusate sodium  100 mg Oral BID   enoxaparin (LOVENOX) injection  40 mg Subcutaneous Daily   [START ON 04/16/2023] ferrous sulfate  325 mg Oral Daily   pantoprazole  40 mg Oral Daily   predniSONE  10 mg Oral PC lunch   predniSONE  10 mg Oral PC supper   [START ON 04/16/2023] predniSONE  10 mg Oral 3 x daily with food   [START ON 04/17/2023] predniSONE  10 mg Oral 4X daily taper   predniSONE  20 mg Oral Nightly   [START ON 04/16/2023] predniSONE  20 mg Oral Nightly   sodium chloride flush  3 mL Intravenous Q12H    Objective: Vital signs in last 24 hours: Temp:  [98.4 F (36.9 C)-99 F (37.2 C)] 98.6 F (37 C) (09/04 1012) Pulse Rate:  [82-104] 100 (09/04 1012) Resp:  [11-18] 18 (09/04 1012) BP: (126-159)/(84-118) 128/84 (09/04 1012) SpO2:  [95 %-100 %] 99 % (09/04 1012) Weight:  [126.2 kg] 126.2 kg (09/04 0500)  Physical Exam  Constitutional:  oriented to person, place, and time. appears well-developed and well-nourished. No distress.  HENT: [place]/AT, PERRLA, no scleral icterus Mouth/Throat: Oropharynx is clear and moist. No oropharyngeal exudate.  Cardiovascular: Normal rate, regular rhythm and normal heart sounds. Exam reveals no gallop and no friction rub.  No murmur heard.  Pulmonary/Chest: Effort normal and breath sounds normal. No respiratory distress.  has no wheezes.  Neck = supple, no  nuchal rigidity Abdominal: Soft. Bowel sounds are normal.  exhibits no distension. There is no tenderness.  ZOX:WRUEA knee wrapped Lymphadenopathy: no cervical adenopathy. No axillary adenopathy Neurological: alert and oriented to person, place, and time.  Skin: Skin is warm and dry. No rash noted. No erythema.  Psychiatric: a normal mood and affect.  behavior is normal.    Lab Results Recent Labs    04/13/23 0332 04/14/23 0253 04/14/23 1703 04/15/23 0311  WBC 7.7 8.4  --  14.0*  HGB 7.4* 7.6* 7.5* 6.6*  HCT 27.1* 27.6* 22.0* 23.6*  NA 137  --  138 135  K 4.3  --  4.2 4.0  CL 107  --   --  100  CO2 25  --   --  25  BUN 16  --   --  9  CREATININE 0.52  --   --  0.57   Liver Panel Recent Labs    04/13/23 0332  PROT 6.9  ALBUMIN 2.9*  AST 12*  ALT 10  ALKPHOS 64  BILITOT <0.1*   Lab Results  Component Value Date   ESRSEDRATE 100 (H) 04/08/2023    Microbiology: 8/29 group b strep - on aspirate cx Studies/Results: DG Knee Right Port  Result Date: 04/14/2023 CLINICAL DATA:  Status post total right knee arthroplasty. Osteomyelitis  of right knee region. Septic arthritis of right knee. EXAM: PORTABLE RIGHT KNEE - 1-2 VIEW COMPARISON:  Right knee radiographs 04/08/2023, MRI right knee 04/09/2023 FINDINGS: Interval total right knee arthroplasty with metallic femoral component and lower density tibial component. Antibiotic beads are seen throughout the tibial tunnel common femoral tunnel, and medial and suprapatellar compartment. Moderate joint effusion. Mild subcutaneous and intra-articular air. No cortical erosion is seen. IMPRESSION: Interval total right knee arthroplasty, presumably for resection of the prior medial greater than lateral compartment articular surface septic arthritis/osteomyelitis, with antibiotic beads in place. Moderate joint effusion. Electronically Signed   By: Neita Garnet M.D.   On: 04/14/2023 19:25     Assessment/Plan: Group b strep right knee septic  arthritis/concern for osteomyelitis = POD#1 with I x D and implant for stability - plan for 6 wk of iv penicillin vs. Cefazolin. Awaiting to see charity care/approval. Continue on penicillin presently  Will get picc line placed today  Pain management =defer to primary team to manage  [facility] Pager: [phone]  04/15/2023, 1:11 PM

## 2023-04-15 NOTE — *Deleted (Incomplete)
Meds -tylenol (cox 123) Reducing the downstream effects of prostanoids via COX-1[plt and SMCl], COX-2 [paranchymal], and COX-3 [cns] inhibition reduces pain signalling and fever  -dose  -AE - celecoxib (cox-2 selective so not affecting stomach, intestines, kidney platelets [affecting macrophages --> decrease inflammation])  -dose 200 mg bid  -AE -diphenhy gen 1 antihistamine block activation of H1Rs, reducing bronchoconstriction, vasodilation, vessel permeability, and mucus secretion part of the allergic response  - dose 12.5-25 mg prn for itching  - AE: drowsiness bc cross BBB  -dexamethasone 0.75 = 4mg  methylp = 5mg  prednision = 20mg  hydcortisone = 25 cortisone  -dose  -ae  -enoxaparin --> surgical [40 mg is used bc of literature if you do multiple dosing ] [0.5mg /kg for medical but not as ]  -dose  -ae -hydralazine [decreases blood pressure by exhibiting a peripheral-vasodilating effect through a direct relaxation of vascular smooth muscle]  -dose 5mg  injection prn for high bp  -ae: hepatotoxicity -cepacol [inhibits depolarization as a local anesthetic]  -methocarbamol [musculoskeletal pain adj]  -dose 1500mg  in 4 divided dose  -ae: Somnolence, dizziness -metoclopramide [Serotonin 5-HT4 Receptor (5-HT4R)] for n/v  -dose  -ae -ondansetron [selective 5-HT3R antagonist; antiemetic] By promoting normal gastric emptying and motility via ACh release from enteric neurons, metoclopramide suppresses vomiting   -dose  -ae -penicillin G  -dose  -ae -pred  -dose: pack so 20mg  then 10mg  x4 for tapering  -ae -txa iv  -dose  -ae

## 2023-04-15 NOTE — Op Note (Signed)
04/14/2023  7:57 AM  PATIENT:  Sharon Faulkner    PRE-OPERATIVE DIAGNOSIS:  SEPTIC KNEE  POST-OPERATIVE DIAGNOSIS:  Same  PROCEDURE:  INCISION AND DRAINAGE RIGHT KNEE, TOTAL KNEE ARTHROPLASTY  SURGEON:  Sheral Apley, MD  ASSISTANT: Levester Fresh, [name], he was present and scrubbed throughout the case, critical for completion in a timely fashion, and for retraction, instrumentation, and closure.   ANESTHESIA:   gen  PREOPERATIVE INDICATIONS:  Sharon Faulkner is a  47 y.o. female with a diagnosis of SEPTIC KNEE who failed conservative measures and elected for surgical management.    The risks benefits and alternatives were discussed with the patient preoperatively including but not limited to the risks of infection, bleeding, nerve injury, cardiopulmonary complications, the need for revision surgery, among others, and the patient was willing to proceed.  OPERATIVE IMPLANTS: stryker all poly tibia and CR femur with abx cement  OPERATIVE FINDINGS: infectious joint fluid  BLOOD LOSS: 600  COMPLICATIONS: none  TOURNIQUET TIME:  OPERATIVE PROCEDURE:  Patient was identified in the preoperative holding area and site was marked by me She was transported to the operating theater and placed on the table in supine position taking care to pad all bony prominences. After a preincinduction time out anesthesia was induced. The right lower extremity was prepped and draped in normal sterile fashion and a pre-incision timeout was performed. She received ancef for preoperative antibiotics.   Tourniquet was inflated I made an anterior incision to the level of the knee capsule venous tourniquet was noted given her habitus I elected to work without the tourniquet at this point's bleeding was worsened by the venous tourniquet I used an [employer] as well.  I made a medial parapatellar arthrotomy  I inserted an intramedullary guide she had minimal to no cartilage remaining on weightbearing surfaces and  purulent fluid in the joint  I made a distal femur cut of 8 mm  I then placed a 4-in-1 block and made and completed the distal femur cuts  Next I remove the ACL I placed the tibial guide and made a proximal tibial resection  I used the gap checker and was happy with a size 9 poly  I then placed trial implants after performing a debridement of her meniscus and synovium  I was happy with the trip trial tracking good gaps in flexion and extension  I then delivered the proximal tibia and completed tibial prep with keel punch and reamer  I then thoroughly irrigated with Irrisept and saline  I placed antibiotic beads in the femoral metaphysis as well as tibia I then used antibiotic cement to cement the above-mentioned implants into place this was a size 3 femur and a 3 x 9 patella poly tibia  Given her bleeding and infectious status I elected not to resurface the patella.  After thorough irrigation with Irrisept capsule was closed followed by the skin in layers  The aquamantis was utilized for this case to help facilitate better hemostasis as patient was felt to be at increased risk of bleeding because of preop anemia and complex case requiring increased OR time and/or exposure.   POST OPERATIVE PLAN: Weightbearing as tolerated mobilize with PT

## 2023-04-15 NOTE — Progress Notes (Signed)
Date and time results received: 04/15/23 at 0405  Test: Hemoglobin Critical Value: 6.6  Name of Provider Notified: Notified primary nurse Providence Crosby, RN), primary to contact on-call provider  Orders Received? Or Actions Taken?: primary RN will update if any new orders are received

## 2023-04-15 NOTE — Progress Notes (Signed)
PROGRESS NOTE    Sharon Faulkner  ZOX:[id number] DOB: [birth year] DOA: 04/08/2023 PCP: Patient, No Pcp Per   Brief Narrative:  The patient is a 47 year old morbidly obese African-American female with a past medical history significant for but not limited to asthma as well as other comorbidities who presented with right knee swelling and pain.  Orthopedic surgery was consulted and she is status post arthrocentesis.  Synovial fluid culture showed strep agalactiae and so ID was consulted and was placed on penicillin G.  She was found to have extensive osteomyelitis and infected tissue in the right knee joint that lead to irreversible damage almost complete cartilage degeneration.  Orthopedic surgery is following closely and plan for two-stage total knee arthroplasty with antibiotic placement and she underwent incision and drainage and also had a total knee arthroplasty is postoperative day 1.  ID is recommending a prolonged course of antibiotics.  Pain is relatively still uncontrolled and will need further adjustments.  Hospitalization has been complicated by acute on chronic anemia however patient is refused blood transfusions and wants to monitor her hemoglobin count for now.  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  No problem with ambulation at baseline.   -Underwent arthrocentesis in the emergency department.  Arthrocentesis showed streptococcal agalactiae.   -ID consulted.  No history of fever or chills. Currently on Penicillin G for antibiotic management -WBC Trend: Recent Labs  Lab 04/09/23 0624 04/10/23 8119 04/11/23 0320 04/12/23 0348 04/13/23 0332 04/14/23 0253 04/15/23 0311  WBC 6.6 9.3 6.6 7.4 7.7 8.4 14.0*  -MRI showed large joint effusion with high-grade synovial thickening and synovial enhancement, high-grade marrow edema . -Found to have extensive osteomyelitis, infected tissue in the knee joint  that has lead to irreversible damage and almost complete cartilage degeneration.   -Orthopedics closely following, did arthoscopy, planning for two-stage total knee arthroplasty with antibiotic placement. -Blood cultures have not shown any growth so far.  Continue pain management, supportive care. -Patient underwent a arthroscopy of her right knee on 04/14/2023 as well as incision and drainage of the right knee with total knee arthroplasty done by Dr. Margarita Rana. -Continues to have quite a lot of pain which is severe and has not mobilized yet. -Orthopedic surgery has adjusted her pain regimen and adjusted her hydromorphone to every 2 as needed and added Celebrex as well as a Medrol Dosepak.  Taper initiated.  Currently she is on acetaminophen at 1000 mg p.o. every 6 scheduled as well as celecoxib 201 p.o. twice daily, IV hydromorphone 0.5 to 1 mg every 2 as needed for severe pain as well as p.o. oxycodone 5 to 10 mg every 4 as needed for moderate pain and 10 to 50 mg every 4 as needed severe pain -Patient was advised that today tomorrow will be the top of the day since her TKA since her nerve block will wear off; the weightbearing status is weightbearing as tolerated right lower extremity and are recommending leaving the dressings intact until follow-up and reinforcing dressings as needed -Currently holding her Lovenox 40 mg daily until her hemoglobin increases but recommended SCDs and ambulation -Patient is postoperative day 1 with incision and drainage implant for stability in the infectious diseases are recommending 6 weeks of IV penicillin versus cefazolin per awaiting to see with charity care and can be approved; PICC line to be placed -Patient also received tranexamic acid 1000 mg IV x 1 in the PACU -Patient having  a CPM to be placed however was unable to be applied due to her pain level.  History of Asthma -Currently not in exacerbation -Continue to Monitor respiratory status carefully  ABLA  superimposed on Chronic Microcytic Anemia -Hgb/Hct Trend: Recent Labs  Lab 04/10/23 0632 04/11/23 0320 04/12/23 0348 04/13/23 0332 04/14/23 0253 04/14/23 1703 04/15/23 0311  HGB 7.2* 6.9* 7.5* 7.4* 7.6* 7.5* 6.6*  HCT 25.6* 24.6* 26.9* 27.1* 27.6* 22.0* 23.6*  MCV 67.4* 68.3* 69.0* 69.5* 70.1*  --  70.2*  -Iron Panel done and showed Iron Level of 24, UIBC of 350, TIBC of 374, and Saturation Ratios of 6% -She was attempted to be given a unit of blood during this hospitalization but has declined -Patient has declined repeat blood transfusion and wishes to repeat her blood work this evening and if it still low then she may be agreeable for blood transfusion -Continue to monitor for signs and symptoms of bleeding; no overt bleeding noted -Repeat CBC in the a.m.  Thrombocytosis -Likely reactive in the setting of her septic knee -Platelet Count Trend: Recent Labs  Lab 04/09/23 0624 04/10/23 1610 04/11/23 0320 04/12/23 0348 04/13/23 0332 04/14/23 0253 04/15/23 0311  PLT 594* 565* 507* PLATELET CLUMPS NOTED ON SMEAR, UNABLE TO ESTIMATE 538* 476* 541*  -Continue to monitor and repeat CBC in a.m.  Hypoalbuminemia -Patient's Albumin Trend: Recent Labs  Lab 04/09/23 0624 04/10/23 9604 04/11/23 0320 04/12/23 0348 04/13/23 0332  ALBUMIN 2.8* 2.5* 2.4* 2.9* 2.9*  -Continue to Monitor and Trend and repeat CMP in the AM  Morbid Obesity -Complicates overall prognosis and care -Estimated body mass index is 47.76 kg/m as calculated from the following:   Height as of this encounter: 5\' 4"  (1.626 m).   Weight as of this encounter: 126.2 kg.  -Weight Loss and Dietary Counseling given   DVT prophylaxis: SCDs Start: 04/14/23 1814 Place TED hose Start: 04/14/23 1814 enoxaparin (LOVENOX) injection 40 mg Start: 04/12/23 1000    Code Status: Full Code Family Communication: No family currently at bedside  Disposition Plan:  Level of care: Med-Surg Status is: Inpatient Remains  inpatient appropriate because: His further clinical improvement and clearance by infectious disease as well as orthopedic surgery as she will need her pain control..  PT OT recommending no follow-up   Consultants:  Orthopedic surgery Infectious diseases  Procedures:  INCISION AND DRAINAGE RIGHT KNEE, TOTAL KNEE ARTHROPLASTY Dr. Margarita Rana on 04/14/2023  Antimicrobials:  Anti-infectives (From admission, onward)    Start     Dose/Rate Route Frequency Ordered Stop   04/14/23 1357  ceFAZolin (ANCEF) 3-0.9 GM/100ML-% IVPB       Note to Pharmacy: Ramond Craver R: cabinet override      04/14/23 1357 04/15/23 0214   04/14/23 1245  gentamicin (GARAMYCIN) injection 800 mg  Status:  Discontinued        800 mg Intramuscular  Once 04/14/23 1236 04/15/23 1025   04/12/23 1200  penicillin G potassium 12 Million Units in dextrose 5 % 500 mL CONTINUOUS infusion        12 Million Units 41.7 mL/hr over 12 Hours Intravenous Every 12 hours 04/12/23 1105 05/21/23 2159   04/10/23 2000  penicillin G potassium 12 Million Units in dextrose 5 % 500 mL CONTINUOUS infusion  Status:  Discontinued        12 Million Units 41.7 mL/hr over 12 Hours Intravenous Every 12 hours 04/10/23 1850 04/12/23 1107   04/10/23 0600  vancomycin (VANCOCIN) IVPB 1000 mg/200 mL premix  Status:  Discontinued        1,000 mg 200 mL/hr over 60 Minutes Intravenous Every 12 hours 04/09/23 1434 04/10/23 1850   04/09/23 1800  vancomycin (VANCOREADY) IVPB 2000 mg/400 mL        2,000 mg 200 mL/hr over 120 Minutes Intravenous  Once 04/09/23 1434 04/09/23 2300   04/09/23 1130  ceFAZolin (ANCEF) IVPB 2g/100 mL premix        2 g 200 mL/hr over 30 Minutes Intravenous On call to O.R. 04/09/23 1044 04/09/23 1531   04/08/23 2330  ceFAZolin (ANCEF) IVPB 2g/100 mL premix        2 g 200 mL/hr over 30 Minutes Intravenous  Once 04/08/23 2327 04/09/23 0036       Subjective: Seen and examined at bedside was complaining about significant knee  pain.  Denied any chest pain or shortness of breath.  Feels okay otherwise despite her knee pain.  Continues to refuse blood transfusion and wants her blood count to be repeated this evening.  No other concerns or complaints at this time.  Objective: Vitals:   04/15/23 0500 04/15/23 0612 04/15/23 1012 04/15/23 1423  BP:  (!) 143/86 128/84 137/88  Pulse:  98 100 (!) 102  Resp:  17 18 18   Temp:  99 F (37.2 C) 98.6 F (37 C) 98.6 F (37 C)  TempSrc:  Oral    SpO2:  96% 99% 98%  Weight: 126.2 kg     Height:        Intake/Output Summary (Last 24 hours) at 04/15/2023 1727 Last data filed at 04/15/2023 1426 Gross per 24 hour  Intake 1618.35 ml  Output 4300 ml  Net -2681.65 ml   Filed Weights   04/13/23 0500 04/14/23 1141 04/15/23 0500  Weight: 126.1 kg 126.1 kg 126.2 kg   Examination: Physical Exam:  Constitutional: WN/WD morbidly obese African-American female who appears a little uncomfortable Respiratory: Diminished to auscultation bilaterally, no wheezing, rales, rhonchi or crackles. Normal respiratory effort and patient is not tachypenic. No accessory muscle use.  Unlabored breathing Cardiovascular: RRR, no murmurs / rubs / gallops. S1 and S2 auscultated.  Right knee is wrapped Abdomen: Soft, non-tender, distended secondary to body habitus. Bowel sounds positive.  GU: Deferred. Musculoskeletal: No clubbing / cyanosis of digits/nails. No joint deformity upper and lower extremities.  Skin: No rashes, lesions, ulcers on limited skin evaluation. No induration; Warm and dry.  Neurologic: CN 2-12 grossly intact with no focal deficits. Romberg sign and cerebellar reflexes not assessed.  Psychiatric: Normal judgment and insight. Alert and oriented x 3.  Anxious mood  Data Reviewed: I have personally reviewed following labs and imaging studies  CBC: Recent Labs  Lab 04/11/23 0320 04/12/23 0348 04/13/23 0332 04/14/23 0253 04/14/23 1703 04/15/23 0311  WBC 6.6 7.4 7.7 8.4  --   14.0*  HGB 6.9* 7.5* 7.4* 7.6* 7.5* 6.6*  HCT 24.6* 26.9* 27.1* 27.6* 22.0* 23.6*  MCV 68.3* 69.0* 69.5* 70.1*  --  70.2*  PLT 507* PLATELET CLUMPS NOTED ON SMEAR, UNABLE TO ESTIMATE 538* 476*  --  541*   Basic Metabolic Panel: Recent Labs  Lab 04/10/23 0632 04/11/23 0320 04/12/23 0348 04/13/23 0332 04/14/23 1703 04/15/23 0311  NA 137 139 139 137 138 135  K 4.1 3.8 3.9 4.3 4.2 4.0  CL 103 104 103 107  --  100  CO2 25 26 27 25   --  25  GLUCOSE 127* 104* 114* 104*  --  125*  BUN 11 10 13 16   --  9  CREATININE 0.64 0.60 0.61 0.52  --  0.57  CALCIUM 8.9 8.9 8.7* 8.8*  --  8.9  MG 1.7 1.6* 2.0  --   --   --    GFR: Estimated Creatinine Clearance: 113.1 mL/min (by C-G formula based on SCr of 0.57 mg/dL). Liver Function Tests: Recent Labs  Lab 04/09/23 0624 04/10/23 6045 04/11/23 0320 04/12/23 0348 04/13/23 0332  AST 13* 14* 11* 13* 12*  ALT 10 10 9 9 10   ALKPHOS 65 61 58 61 64  BILITOT 0.3 0.5 0.3 0.3 <0.1*  PROT 7.0 6.8 6.0* 6.9 6.9  ALBUMIN 2.8* 2.5* 2.4* 2.9* 2.9*   No results for input(s): "LIPASE", "AMYLASE" in the last 168 hours. No results for input(s): "AMMONIA" in the last 168 hours. Coagulation Profile: No results for input(s): "INR", "PROTIME" in the last 168 hours. Cardiac Enzymes: No results for input(s): "CKTOTAL", "CKMB", "CKMBINDEX", "TROPONINI" in the last 168 hours. BNP (last 3 results) No results for input(s): "PROBNP" in the last 8760 hours. HbA1C: No results for input(s): "HGBA1C" in the last 72 hours. CBG: No results for input(s): "GLUCAP" in the last 168 hours. Lipid Profile: No results for input(s): "CHOL", "HDL", "LDLCALC", "TRIG", "CHOLHDL", "LDLDIRECT" in the last 72 hours. Thyroid Function Tests: No results for input(s): "TSH", "T4TOTAL", "FREET4", "T3FREE", "THYROIDAB" in the last 72 hours. Anemia Panel: Recent Labs    04/13/23 1217  TIBC 374  IRON 24*   Sepsis Labs: No results for input(s): "PROCALCITON", "LATICACIDVEN" in the  last 168 hours.  Recent Results (from the past 240 hour(s))  Body fluid culture w Gram Stain     Status: None   Collection Time: 04/08/23  6:34 PM   Specimen: KNEE; Body Fluid  Result Value Ref Range Status   Specimen Description   Final    KNEE Performed at [facility], [address] [place], [place] [address]    Special Requests   Final    NONE Performed at [facility], [address] [place], [place] [address]    Gram Stain   Final    ABUNDANT WBC PRESENT, PREDOMINANTLY PMN RARE GRAM POSITIVE COCCI IN PAIRS IN CHAINS Gram Stain Report Called to,Read Back By and Verified With: RN . A. Normand Sloop [id number] @ 2236 FH    Culture   Final    FEW GROUP B STREP(S.AGALACTIAE)ISOLATED TESTING AGAINST S. AGALACTIAE NOT ROUTINELY PERFORMED DUE TO PREDICTABILITY OF AMP/PEN/VAN SUSCEPTIBILITY. Performed at [facility], [address] [place], [place] [address]    Report Status 04/10/2023 FINAL  Final  Culture, blood (Routine X 2) w Reflex to ID Panel     Status: None   Collection Time: 04/09/23  6:19 AM   Specimen: BLOOD RIGHT HAND  Result Value Ref Range Status   Specimen Description BLOOD RIGHT HAND  Final   Special Requests   Final    BOTTLES DRAWN AEROBIC ONLY Blood Culture results may not be optimal due to an inadequate volume of blood received in culture bottles   Culture   Final    NO GROWTH 5 DAYS Performed at [facility], [address] [place], [place] [address]    Report Status 04/14/2023 FINAL  Final  Culture, blood (Routine X 2) w Reflex to ID Panel     Status: None   Collection Time: 04/09/23  8:06 AM   Specimen: BLOOD  Result Value Ref Range Status   Specimen Description BLOOD SITE  NOT SPECIFIED  Final   Special Requests   Final    BOTTLES DRAWN AEROBIC AND ANAEROBIC Blood Culture results may not be optimal due to an inadequate volume of blood received in culture bottles   Culture   Final    NO GROWTH 5 DAYS Performed at [facility], [address] [place], [place] [address]    Report Status 04/14/2023 FINAL  Final  Aerobic/Anaerobic Culture w Gram Stain (surgical/deep wound)     Status: None   Collection Time: 04/09/23  3:28 PM   Specimen: Abscess  Result Value Ref Range Status   Specimen Description ABSCESS  Final   Special Requests right knee  Final   Gram Stain   Final    ABUNDANT WBC PRESENT, PREDOMINANTLY PMN NO ORGANISMS SEEN    Culture   Final    MODERATE GROUP B STREP(S.AGALACTIAE)ISOLATED TESTING AGAINST S. AGALACTIAE NOT ROUTINELY PERFORMED DUE TO PREDICTABILITY OF AMP/PEN/VAN SUSCEPTIBILITY. NO ANAEROBES ISOLATED Performed at [facility], [address], [place], [place] [address]    Report Status 04/14/2023 FINAL  Final  Surgical pcr screen     Status: None   Collection Time: 04/14/23 11:40 AM   Specimen: Nasal Mucosa; Nasal Swab  Result Value Ref Range Status   MRSA, PCR NEGATIVE NEGATIVE Final   Staphylococcus aureus NEGATIVE NEGATIVE Final    Comment: (NOTE) The Xpert SA Assay (FDA approved for NASAL specimens in patients 43 years of age and older), is one component of a comprehensive surveillance program. It is not intended to diagnose infection nor to guide or monitor treatment. Performed at [facility], [address], [place], [place] [address]      Radiology Studies: [place] EKG SITE RITE  Result Date: 04/15/2023 If Site Rite image not attached, placement could not be confirmed due to current cardiac rhythm.  DG Knee Right Port  Result Date: 04/14/2023 CLINICAL DATA:  Status post total right knee arthroplasty. Osteomyelitis of right knee region. Septic arthritis of right knee. EXAM: PORTABLE RIGHT KNEE - 1-2 VIEW COMPARISON:  Right knee radiographs 04/08/2023, MRI right knee 04/09/2023 FINDINGS: Interval total right knee arthroplasty with metallic femoral component and lower density tibial component. Antibiotic beads are seen throughout the tibial  tunnel common femoral tunnel, and medial and suprapatellar compartment. Moderate joint effusion. Mild subcutaneous and intra-articular air. No cortical erosion is seen. IMPRESSION: Interval total right knee arthroplasty, presumably for resection of the prior medial greater than lateral compartment articular surface septic arthritis/osteomyelitis, with antibiotic beads in place. Moderate joint effusion. Electronically Signed   By: Neita Garnet M.D.   On: 04/14/2023 19:25     Scheduled Meds:  sodium chloride   Intravenous Once   sodium chloride   Intravenous Once   acetaminophen  1,000 mg Oral Q6H   celecoxib  200 mg Oral BID   Chlorhexidine Gluconate Cloth  6 each Topical Daily   docusate sodium  100 mg Oral BID   enoxaparin (LOVENOX) injection  40 mg Subcutaneous Daily   [START ON 04/16/2023] ferrous sulfate  325 mg Oral Daily   pantoprazole  40 mg Oral Daily   predniSONE  10 mg Oral PC supper   [START ON 04/16/2023] predniSONE  10 mg Oral 3 x daily with food   [START ON 04/17/2023] predniSONE  10 mg Oral 4X daily taper   predniSONE  20 mg Oral Nightly   [START ON 04/16/2023] predniSONE  20 mg Oral Nightly   sodium chloride  flush  3 mL Intravenous Q12H   Continuous Infusions:  sodium chloride     sodium chloride 40 mL/hr at 04/15/23 0644   methocarbamol (ROBAXIN) IV     penicillin G potassium 12 Million Units in dextrose 5 % 500 mL CONTINUOUS infusion 12 Million Units (04/15/23 1028)    LOS: 6 days   Marguerita Merles, DO [facility] Available via Epic secure chat 7am-7pm After these hours, please refer to coverage provider listed on [email or web address] 04/15/2023, 5:27 PM

## 2023-04-15 NOTE — TOC Progression Note (Signed)
Transition of Care [facility]) - Progression Note   Patient Details  Name: Sharon Faulkner MRN: [id number] Date of Birth: 1976-04-29  Transition of Care [facility] CM/SW Contact  Ewing Schlein, LCSW Phone Number: 04/15/2023, 3:58 PM  Clinical Narrative: Patient will need a charity rolling walker, which was donated to [facility] and will be provided to the patient. CSW followed up with Pam with [facility] and was notified patient cannot afford the original antibiotic recommendation, so two other medications are being considered. [facility] also declined to accept the charity [facility] referral, so an LOG is being considered. TOC supervisor updated.  Expected Discharge Plan: Home w Home Health Services Barriers to Discharge: Continued Medical Work up, Inadequate or no insurance  Expected Discharge Plan and Services In-house Referral: Clinical Social Work Discharge Planning Services: Indigent Health Clinic, Medication Assistance, MATCH Program Post Acute Care Choice: Home Health Living arrangements for the past 2 months: Hotel/Motel           DME Arranged: Walker rolling Representative spoke with at DME Agency: [facility] walker  Social Determinants of Health (SDOH) Interventions SDOH Screenings   Housing: Patient Declined (04/09/2023)  Tobacco Use: High Risk (04/14/2023)   Readmission Risk Interventions     No data to display

## 2023-04-15 NOTE — Progress Notes (Signed)
Orthopedic Tech Progress Note Patient Details:  Sharon Faulkner 10-27-1975 [id number] Unable to apply CPM at this time due to patient's pain level.  CPM Right Knee Right Knee Flexion (Degrees): 60 Right Knee Extension (Degrees): 0  Post Interventions Patient Tolerated: Unable to use device properly Instructions Provided: Adjustment of device, Care of device  Jerren Flinchbaugh E Kayren Holck 04/15/2023, 9:31 AM

## 2023-04-15 NOTE — Progress Notes (Signed)
PHARMACY NOTE -  Penicillin G  Pharmacy has been assisting with dosing of Pen G for GBS septic arthritis/osteomyelitis. Dosage remains stable at 1 million units/hr continuous infusion and further renal adjustments per institutional Pharmacy antibiotic protocol Patient will continue the above after discharge; see OPAT note for full details   Pharmacy will sign off, following peripherally for culture results, dose adjustments, and length of therapy. Please reconsult if a change in clinical status warrants re-evaluation of dosage.  Bernadene Person, PharmD, BCPS [phone] 04/15/2023, 7:57 AM

## 2023-04-15 NOTE — Progress Notes (Signed)
Physical Therapy Treatment Patient Details Name: Sharon Faulkner MRN: [id number] DOB: 1976/05/13 Today's Date: 04/15/2023   History of Present Illness 47 yo female, presents to [facility] ED 8/28 with R knee and leg pain and swelling that has been present since 7/28. Prior ED visit 7/31 Xray clear of fx but showed Effusion. Unable to draw any fluid then 8/28 R knee aspirated with small volume of purulent fluid attained. Pt transferred to [facility] S/p 8/29 R knee arthroscopic I&D which revealed chronic infection and component of osteomyelitis. s/p I&D and TKA 04/14/23.  PMH: Asthma and PE not on anticoagulation.    PT Comments  Pt was able to transfer bed to recliner with RW and R KI. Pt reports she had an antibiotic spacer placed in R knee, however op note states pt had a TKA, will need to clarify which procedure she had. Pt puts forth good effort. She reported that she was only transferring to a WC (which she shared with her mother) for the past month due to R knee issues, prior to that she was able to ambulate independently without an assistive device.     If plan is discharge home, recommend the following: A lot of help with walking and/or transfers;A lot of help with bathing/dressing/bathroom;Assistance with cooking/housework;Assist for transportation;Help with stairs or ramp for entrance   Can travel by private vehicle        Equipment Recommendations  Rolling walker (2 wheels)    Recommendations for Other Services       Precautions / Restrictions Precautions Precautions: Fall;Knee Precaution Booklet Issued: Yes (comment) Precaution Comments: reviewed no pillow under knee Restrictions RLE Weight Bearing: Weight bearing as tolerated     Mobility  Bed Mobility Overal bed mobility: Modified Independent Bed Mobility: Supine to Sit     Supine to sit: Supervision, HOB elevated, Used rails     General bed mobility comments: HOB up, used rail, used gait belt as RLE lifter, increased time     Transfers Overall transfer level: Needs assistance Equipment used: Rolling walker (2 wheels) Transfers: Sit to/from Stand, Bed to chair/wheelchair/BSC Sit to Stand: Contact guard assist, From elevated surface Stand pivot transfers: Contact guard assist         General transfer comment: VCs hand placement, powered up from elevated bed    Ambulation/Gait                   Stairs             Wheelchair Mobility     Tilt Bed    Modified Rankin (Stroke Patients Only)       Balance   Sitting-balance support: No upper extremity supported, Feet supported Sitting balance-Leahy Scale: Good     Standing balance support: During functional activity, Reliant on assistive device for balance Standing balance-Leahy Scale: Poor                              Cognition Arousal: Alert Behavior During Therapy: WFL for tasks assessed/performed Overall Cognitive Status: Within Functional Limits for tasks assessed                                          Exercises Total Joint Exercises Ankle Circles/Pumps: AROM, Both, 10 reps, Supine Quad Sets: AROM, Left, Supine (x 3 reps, demonstrated on LLE as pt couldn't  tolerate doing it on RLE 2* pain) Heel Slides: Limitations, Right Heel Slides Limitations: demonstrated technique using gait belt looped on R foot, pt did not attempt heel slide on R 2* pain but stated she's attempt this exercise later independently    General Comments        Pertinent Vitals/Pain Pain Assessment Pain Score: 10-Worst pain ever Faces Pain Scale: Hurts even more Pain Location: R knee Pain Descriptors / Indicators: Aching, Discomfort, Constant, Guarding Pain Intervention(s): Limited activity within patient's tolerance, Monitored during session, Premedicated before session (pt declined ice)    Home Living Family/patient expects to be discharged to:: Other (Comment) (hotel) Living Arrangements: Parent (care giver  for her blind [facility] mother) Available Help at Discharge: Family;Available PRN/intermittently Type of Home: Other(Comment) ([employer] Room)           Home Equipment: Wheelchair - manual;Tub bench;Shower seat Additional Comments: sharing wheelchair with her mother    Prior Function            PT Goals (current goals can now be found in the care plan section) Acute Rehab PT Goals Patient Stated Goal: decrease pain, to be able to care for her mother who is blind and in a WC PT Goal Formulation: With patient Time For Goal Achievement: 04/24/23 Potential to Achieve Goals: Fair Progress towards PT goals: Progressing toward goals    Frequency    7X/week      PT Plan      Co-evaluation              AM-PAC PT "6 Clicks" Mobility   Outcome Measure  Help needed turning from your back to your side while in a flat bed without using bedrails?: A Little Help needed moving from lying on your back to sitting on the side of a flat bed without using bedrails?: A Little Help needed moving to and from a bed to a chair (including a wheelchair)?: A Little Help needed standing up from a chair using your arms (e.g., wheelchair or bedside chair)?: A Little Help needed to walk in hospital room?: A Lot Help needed climbing 3-5 steps with a railing? : Total 6 Click Score: 15    End of Session Equipment Utilized During Treatment: Gait belt Activity Tolerance: Patient limited by pain Patient left: with call bell/phone within reach;in chair;Other (comment) (OT in room) Nurse Communication: Mobility status PT Visit Diagnosis: Unsteadiness on feet (R26.81);Other abnormalities of gait and mobility (R26.89);Muscle weakness (generalized) (M62.81);Difficulty in walking, not elsewhere classified (R26.2);Pain Pain - Right/Left: Right Pain - part of body: Knee     Time: 1436-1500 PT Time Calculation (min) (ACUTE ONLY): 24 min  Charges:    $Therapeutic Activity: 23-37 mins PT  General Charges $$ ACUTE PT VISIT: 1 Visit                     Tamala Ser PT 04/15/2023  Acute Rehabilitation Services  Office [phone]

## 2023-04-15 NOTE — Progress Notes (Signed)
Subjective: Patient reports pain as severe. Says meds not helping. Refusing to move knee. Tolerating diet.  Urinating.   No CP, SOB.  Hasn't mobilized OOB yet. Refusing to do so. I told her that not moving will cause stiffness and scar tissue to build up and then she could end up with little to no knee ROM. Also explained to her about being anemic and again says she would prefer to wait on getting blood. Wants to "see if my body will make it itself". Lost blood in surgery due to TQ not working.  Objective:   VITALS:   Vitals:   04/14/23 2113 04/15/23 0122 04/15/23 0500 04/15/23 0612  BP: (!) 137/92 (!) 155/94  (!) 143/86  Pulse: 96 97  98  Resp: 17 17  17   Temp: 98.9 F (37.2 C) 98.8 F (37.1 C)  99 F (37.2 C)  TempSrc: Oral Oral  Oral  SpO2: 96% 98%  96%  Weight:   126.2 kg   Height:          Latest Ref Rng & Units 04/15/2023    3:11 AM 04/14/2023    5:03 PM 04/14/2023    2:53 AM  CBC  WBC 4.0 - 10.5 K/uL 14.0   8.4   Hemoglobin 12.0 - 15.0 g/dL 6.6  7.5  7.6   Hematocrit 36.0 - 46.0 % 23.6  22.0  27.6   Platelets 150 - 400 K/uL 541   476       Latest Ref Rng & Units 04/15/2023    3:11 AM 04/14/2023    5:03 PM 04/13/2023    3:32 AM  BMP  Glucose 70 - 99 mg/dL 956   387   BUN 6 - 20 mg/dL 9   16   Creatinine 5.64 - 1.00 mg/dL 3.32   9.51   Sodium 884 - 145 mmol/L 135  138  137   Potassium 3.5 - 5.1 mmol/L 4.0  4.2  4.3   Chloride 98 - 111 mmol/L 100   107   CO2 22 - 32 mmol/L 25   25   Calcium 8.9 - 10.3 mg/dL 8.9   8.8    Intake/Output      09/03 0701 09/04 0700 09/04 0701 09/05 0700   P.O.     I.V. (mL/kg) 1402.7 (11.1)    Other 0    IV Piggyback 1849.2    Total Intake(mL/kg) 3252 (25.8)    Urine (mL/kg/hr) 3150 (1)    Blood 800    Total Output 3950    Net -698.1            Physical Exam: General: NAD.  Laying in bed, calm Resp: No increased wob Cardio: regular rate and rhythm ABD soft Neurologically intact MSK Neurovascularly intact Sensation  intact distally Intact pulses distally Dorsiflexion/Plantar flexion intact Incision: dressing C/D/I KI in place  Assessment: 1 Day Post-Op  S/P Procedure(s) (LRB): INCISION AND DRAINAGE RIGHT KNEE (Right) TOTAL KNEE ARTHROPLASTY (Right) by Dr. Jewel Baize. Murphy on 04/14/23  Principal Problem:   Septic arthritis of knee, right (HCC) Active Problems:   Asthma   Chronic anemia   Arthritis, septic, knee (HCC)   Plan: Adjust Dilaudid to q2h PRN, add Celebrex and dose pack. Today and tomorrow will be the toughest days post TKA as her nerve block wears off. This is all to be expected and happens frequently in our patients.   Encouraged her to accept the blood transfusion  Advance diet Up with  therapy, encourage to push through the pain Incentive Spirometry Elevate and Apply ice  Weightbearing: WBAT RLE Insicional and dressing care: Dressings left intact until follow-up and Reinforce dressings as needed Orthopedic device(s):  CPM Showering: Keep dressing dry VTE prophylaxis: Lovenox 40mg  qd but hold until Hgb increases, SCDs, ambulation Pain control: PRN Follow - up plan:  TBD Contact information:  [name], [name]  Dispo:  TBD  based on PT evaluations, pain control, and ability to secure [facility] for IV ABX or not.      Jenne Pane, [name] Office [phone] 04/15/2023, 9:44 AM

## 2023-04-16 ENCOUNTER — Encounter (HOSPITAL_COMMUNITY): Payer: Self-pay | Admitting: Orthopedic Surgery

## 2023-04-16 ENCOUNTER — Other Ambulatory Visit: Payer: Self-pay

## 2023-04-16 DIAGNOSIS — A491 Streptococcal infection, unspecified site: Secondary | ICD-10-CM

## 2023-04-16 LAB — COMPREHENSIVE METABOLIC PANEL
ALT: 12 U/L (ref 0–44)
AST: 16 U/L (ref 15–41)
Albumin: 2.8 g/dL — ABNORMAL LOW (ref 3.5–5.0)
Alkaline Phosphatase: 58 U/L (ref 38–126)
Anion gap: 10 (ref 5–15)
BUN: 12 mg/dL (ref 6–20)
CO2: 24 mmol/L (ref 22–32)
Calcium: 9.1 mg/dL (ref 8.9–10.3)
Chloride: 104 mmol/L (ref 98–111)
Creatinine, Ser: 0.56 mg/dL (ref 0.44–1.00)
GFR, Estimated: >60 mL/min (ref >60)
Glucose, Bld: 143 mg/dL — ABNORMAL HIGH (ref 70–99)
Potassium: 4.5 mmol/L (ref 3.5–5.1)
Sodium: 138 mmol/L (ref 135–145)
Total Bilirubin: 0.2 mg/dL — ABNORMAL LOW (ref 0.3–1.2)
Total Protein: 6.7 g/dL (ref 6.5–8.1)

## 2023-04-16 LAB — CBC WITH DIFFERENTIAL/PLATELET
Abs Immature Granulocytes: 0.11 10*3/uL — ABNORMAL HIGH (ref 0.00–0.07)
Basophils Absolute: 0 10*3/uL (ref 0.0–0.1)
Basophils Relative: 0 %
Eosinophils Absolute: 0 10*3/uL (ref 0.0–0.5)
Eosinophils Relative: 0 %
HCT: 23.1 % — ABNORMAL LOW (ref 36.0–46.0)
Hemoglobin: 6.4 g/dL — CL (ref 12.0–15.0)
Immature Granulocytes: 1 %
Lymphocytes Relative: 10 %
Lymphs Abs: 1 10*3/uL (ref 0.7–4.0)
MCH: 19.9 pg — ABNORMAL LOW (ref 26.0–34.0)
MCHC: 27.7 g/dL — ABNORMAL LOW (ref 30.0–36.0)
MCV: 71.7 fL — ABNORMAL LOW (ref 80.0–100.0)
Monocytes Absolute: 0.7 10*3/uL (ref 0.1–1.0)
Monocytes Relative: 7 %
Neutro Abs: 8.9 10*3/uL — ABNORMAL HIGH (ref 1.7–7.7)
Neutrophils Relative %: 82 %
Platelets: 404 10*3/uL — ABNORMAL HIGH (ref 150–400)
RBC: 3.22 MIL/uL — ABNORMAL LOW (ref 3.87–5.11)
RDW: 20.6 % — ABNORMAL HIGH (ref 11.5–15.5)
WBC: 10.7 10*3/uL — ABNORMAL HIGH (ref 4.0–10.5)
nRBC: 0.2 % (ref 0.0–0.2)

## 2023-04-16 LAB — HEMOGLOBIN AND HEMATOCRIT, BLOOD
HCT: 24.5 % — ABNORMAL LOW (ref 36.0–46.0)
Hemoglobin: 7.1 g/dL — ABNORMAL LOW (ref 12.0–15.0)

## 2023-04-16 LAB — MAGNESIUM: Magnesium: 1.8 mg/dL (ref 1.7–2.4)

## 2023-04-16 LAB — PHOSPHORUS: Phosphorus: 3.4 mg/dL (ref 2.5–4.6)

## 2023-04-16 LAB — PREPARE RBC (CROSSMATCH)

## 2023-04-16 MED ORDER — MAGNESIUM SULFATE 2 GM/50ML IV SOLN
2.0000 g | Freq: Once | INTRAVENOUS | Status: AC
  Administered 2023-04-16: 2 g via INTRAVENOUS
  Filled 2023-04-16: qty 50

## 2023-04-16 MED ORDER — SODIUM CHLORIDE 0.9% FLUSH: 10.0000 mL | INTRAVENOUS | Status: DC | PRN

## 2023-04-16 MED ORDER — SODIUM CHLORIDE 0.9% IV SOLUTION: Freq: Once | INTRAVENOUS | Status: AC

## 2023-04-16 MED ORDER — SODIUM CHLORIDE 0.9 % IV SOLN
2.0000 g | Freq: Every day | INTRAVENOUS | Status: DC
  Administered 2023-04-16 – 2023-04-19 (×4): 2 g via INTRAVENOUS
  Filled 2023-04-16 (×4): qty 20

## 2023-04-16 NOTE — Progress Notes (Addendum)
Physical Therapy Treatment Patient Details Name: Sharon Faulkner MRN: [id number] DOB: May 25, 1976 Today's Date: 04/16/2023   History of Present Illness 47 yo female, presents to [facility] ED 8/28 with R knee and leg pain and swelling that has been present since 7/28. Prior ED visit 7/31 Xray clear of fx but showed Effusion. Unable to draw any fluid then 8/28 R knee aspirated with small volume of purulent fluid attained. Pt transferred to [facility] S/p 8/29 R knee arthroscopic I&D which revealed chronic infection and component of osteomyelitis. s/p I&D and TKA 04/14/23.  PMH: Asthma and PE not on anticoagulation.    PT Comments  Pt was able to perform supine to sit with head of bed elevated, use of bedrails, and use of gait belt as a RLE lifter. Pt prefers to wear R KI with mobility, min assist required to properly position and don KI. Contact guard assist for sit to stand from elevated bed, and to pivot to bedside commode. Increased time for all activity. Encouraged pt to perform R knee flexion exercises, she tolerated ~25* of flexion, limited by pain, importance of knee movement during healing was explained to pt.  Hgb noted to be 6.4 this morning, pt agreed to transfusion today after declining transfusion yesterday. Morning PT session was deferred 2* pt receiving transfusion. Pt did report mild lightheadedness in sitting this session. She reported her menstrual cycle has started.    If plan is discharge home, recommend the following: A lot of help with walking and/or transfers;A lot of help with bathing/dressing/bathroom;Assistance with cooking/housework;Assist for transportation;Help with stairs or ramp for entrance   Can travel by private vehicle        Equipment Recommendations  Rolling walker (2 wheels)    Recommendations for Other Services OT consult     Precautions / Restrictions Precautions Precautions: Fall;Knee Precaution Booklet Issued: Yes (comment) Precaution Comments: reviewed no pillow  under knee Restrictions Weight Bearing Restrictions: No RLE Weight Bearing: Weight bearing as tolerated     Mobility  Bed Mobility Overal bed mobility: Modified Independent Bed Mobility: Supine to Sit     Supine to sit: Supervision, HOB elevated, Used rails     General bed mobility comments: increased time, HOB up, used rails, used gait belt as RLE lifter, assist to position and don R KI    Transfers Overall transfer level: Needs assistance Equipment used: Rolling walker (2 wheels) Transfers: Sit to/from Stand, Bed to chair/wheelchair/BSC Sit to Stand: Contact guard assist, From elevated surface Stand pivot transfers: Contact guard assist         General transfer comment: pivoted bed to bedside commode with RW    Ambulation/Gait                   Stairs             Wheelchair Mobility     Tilt Bed    Modified Rankin (Stroke Patients Only)       Balance Overall balance assessment: Needs assistance   Sitting balance-Leahy Scale: Good     Standing balance support: During functional activity, Reliant on assistive device for balance Standing balance-Leahy Scale: Poor Standing balance comment: can stand with one hand stabilizing                            Cognition Arousal: Alert Behavior During Therapy: WFL for tasks assessed/performed Overall Cognitive Status: Within Functional Limits for tasks assessed  Exercises Total Joint Exercises Ankle Circles/Pumps: AROM, Both, 10 reps, Supine Heel Slides: Right, 5 reps, Supine, AAROM Heel Slides Limitations: using gait belt looped on R foot, Pt tolerated ~25* flexion AAROM, limited by pain    General Comments        Pertinent Vitals/Pain Pain Assessment Faces Pain Scale: Hurts even more Pain Location: R knee Pain Descriptors / Indicators: Aching, Discomfort, Constant, Guarding Pain Intervention(s): Limited activity  within patient's tolerance, Monitored during session, Patient requesting pain meds-RN notified, Repositioned    Home Living                          Prior Function            PT Goals (current goals can now be found in the care plan section) Acute Rehab PT Goals Patient Stated Goal: decrease pain, to be able to care for her mother who is blind and in a WC PT Goal Formulation: With patient Time For Goal Achievement: 04/24/23 Potential to Achieve Goals: Fair Progress towards PT goals: Progressing toward goals    Frequency    7X/week      PT Plan      Co-evaluation              AM-PAC PT "6 Clicks" Mobility   Outcome Measure  Help needed turning from your back to your side while in a flat bed without using bedrails?: A Little Help needed moving from lying on your back to sitting on the side of a flat bed without using bedrails?: A Little Help needed moving to and from a bed to a chair (including a wheelchair)?: A Little Help needed standing up from a chair using your arms (e.g., wheelchair or bedside chair)?: A Little Help needed to walk in hospital room?: A Lot Help needed climbing 3-5 steps with a railing? : Total 6 Click Score: 15    End of Session Equipment Utilized During Treatment: Gait belt Activity Tolerance: Patient limited by pain Patient left: with call bell/phone within reach (on bedside commode) Nurse Communication: Mobility status PT Visit Diagnosis: Unsteadiness on feet (R26.81);Other abnormalities of gait and mobility (R26.89);Muscle weakness (generalized) (M62.81);Difficulty in walking, not elsewhere classified (R26.2);Pain Pain - Right/Left: Right Pain - part of body: Knee     Time: 1610-9604 PT Time Calculation (min) (ACUTE ONLY): 27 min  Charges:    $Therapeutic Activity: 23-37 mins PT General Charges $$ ACUTE PT VISIT: 1 Visit                    Tamala Ser PT 04/16/2023  Acute Rehabilitation  Services  Office [phone]

## 2023-04-16 NOTE — Progress Notes (Signed)
Subjective: Patient reports pain as severe but slightly better than yesterday. Refusing to move knee much. Tolerating diet.  Urinating.   No CP, SOB.  Hasn't mobilized OOB much with PT.   Again explained to her about being anemic and Hgb continuing to drop this AM. Agreed to receiving blood transfusion.  Objective:   VITALS:   Vitals:   04/15/23 1423 04/15/23 2126 04/16/23 0500 04/16/23 0508  BP: 137/88 (!) 140/98  (!) 143/91  Pulse: (!) 102 93  89  Resp: 18 18  18   Temp: 98.6 F (37 C) 98.7 F (37.1 C)  98.6 F (37 C)  TempSrc:  Oral  Oral  SpO2: 98% 95%  97%  Weight:   134.7 kg   Height:          Latest Ref Rng & Units 04/16/2023    3:22 AM 04/15/2023    6:03 PM 04/15/2023    3:11 AM  CBC  WBC 4.0 - 10.5 K/uL 10.7  11.7  14.0   Hemoglobin 12.0 - 15.0 g/dL 6.4  6.6  6.6   Hematocrit 36.0 - 46.0 % 23.1  23.8  23.6   Platelets 150 - 400 K/uL 404  453  541       Latest Ref Rng & Units 04/16/2023    3:22 AM 04/15/2023    3:11 AM 04/14/2023    5:03 PM  BMP  Glucose 70 - 99 mg/dL 132  440    BUN 6 - 20 mg/dL 12  9    Creatinine 1.02 - 1.00 mg/dL 7.25  3.66    Sodium 440 - 145 mmol/L 138  135  138   Potassium 3.5 - 5.1 mmol/L 4.5  4.0  4.2   Chloride 98 - 111 mmol/L 104  100    CO2 22 - 32 mmol/L 24  25    Calcium 8.9 - 10.3 mg/dL 9.1  8.9     Intake/Output      09/04 0701 09/05 0700 09/05 0701 09/06 0700   I.V. (mL/kg) 791.7 (5.9)    Other     IV Piggyback 899.3    Total Intake(mL/kg) 1691 (12.6)    Urine (mL/kg/hr) 3200 (1)    Blood     Total Output 3200    Net -1509            Physical Exam: General: NAD.  Laying in bed, calm Resp: No increased wob Cardio: regular rate and rhythm ABD soft Neurologically intact MSK Neurovascularly intact Sensation intact distally Intact pulses distally Dorsiflexion/Plantar flexion intact Incision: dressing C/D/I   Assessment: 2 Days Post-Op  S/P Procedure(s) (LRB): INCISION AND DRAINAGE RIGHT KNEE (Right) TOTAL  KNEE ARTHROPLASTY (Right) by Dr. Jewel Baize. Eulah Pont on 04/14/23  Principal Problem:   Septic arthritis of knee, right (HCC) Active Problems:   Asthma   Chronic anemia   Arthritis, septic, knee (HCC)   Plan: Blood transfusion today  Advance diet Up with therapy, encourage to push through the pain Incentive Spirometry Elevate and Apply ice  Weightbearing: WBAT RLE Insicional and dressing care: Dressings left intact until follow-up and Reinforce dressings as needed Orthopedic device(s):  CPM Showering: Keep dressing dry VTE prophylaxis: Lovenox 40mg  qd but hold until Hgb increases, SCDs, ambulation Pain control: PRN Follow - up plan:  TBD Contact information:  [name], [name]  Dispo:  TBD  based on PT evaluations, pain control, and ability to secure [facility] for IV ABX or not.  Jenne Pane, [name] Office [phone] 04/16/2023, 7:44 AM

## 2023-04-16 NOTE — Progress Notes (Addendum)
[facility]    Date of Admission:  04/08/2023      ID: Sharon Faulkner is a 47 y.o. female with   Principal Problem:   Septic arthritis of knee, right (HCC) Active Problems:   Asthma   Chronic anemia   Arthritis, septic, knee (HCC)    Subjective: Afebrile. Less knee pain. Working with PT to help ambulate  Medications:   sodium chloride   Intravenous Once   sodium chloride   Intravenous Once   celecoxib  200 mg Oral BID   Chlorhexidine Gluconate Cloth  6 each Topical Daily   docusate sodium  100 mg Oral BID   ferrous sulfate  325 mg Oral Daily   pantoprazole  40 mg Oral Daily   [START ON 04/17/2023] predniSONE  10 mg Oral 4X daily taper   predniSONE  20 mg Oral Nightly   sodium chloride flush  3 mL Intravenous Q12H    Objective: Vital signs in last 24 hours: Temp:  [97.7 F (36.5 C)-98.7 F (37.1 C)] 98.7 F (37.1 C) (09/05 1548) Pulse Rate:  [85-93] 85 (09/05 1548) Resp:  [16-18] 18 (09/05 1548) BP: (111-143)/(70-98) 134/79 (09/05 1548) SpO2:  [94 %-99 %] 94 % (09/05 1548) Weight:  [134.7 kg] 134.7 kg (09/05 0500)  Physical Exam  Constitutional:  oriented to person, place, and time. appears well-developed and well-nourished. No distress.  HENT: [place]/AT, PERRLA, no scleral icterus Mouth/Throat: Oropharynx is clear and moist. No oropharyngeal exudate.  Cardiovascular: Normal rate, regular rhythm and normal heart sounds. Exam reveals no gallop and no friction rub.  No murmur heard.  Pulmonary/Chest: Effort normal and breath sounds normal. No respiratory distress.  has no wheezes.  Neck = supple, no nuchal rigidity Abdominal: Soft. Bowel sounds are normal.  exhibits no distension. There is no tenderness.  Lymphadenopathy: no cervical adenopathy. No axillary adenopathy Ext: right knee wrapped Neurological: alert and oriented to person, place, and time.  Skin: Skin is warm and dry. No rash noted. No erythema.  Psychiatric: a normal mood and  affect.  behavior is normal.   Lab Results Recent Labs    04/15/23 0311 04/15/23 1803 04/16/23 0322  WBC 14.0* 11.7* 10.7*  HGB 6.6* 6.6* 6.4*  HCT 23.6* 23.8* 23.1*  NA 135  --  138  K 4.0  --  4.5  CL 100  --  104  CO2 25  --  24  BUN 9  --  12  CREATININE 0.57  --  0.56   Liver Panel Recent Labs    04/16/23 0322  PROT 6.7  ALBUMIN 2.8*  AST 16  ALT 12  ALKPHOS 58  BILITOT 0.2*   Lab Results  Component Value Date   ESRSEDRATE 100 (H) 04/08/2023   Lab Results  Component Value Date   CRP 0.7 07/20/2020    Microbiology: 8/29: group b strep Studies/Results: [place] EKG SITE RITE  Result Date: 04/16/2023 If Site Rite image not attached, placement could not be confirmed due to current cardiac rhythm.  [place] EKG SITE RITE  Result Date: 04/15/2023 If Site Rite image not attached, placement could not be confirmed due to current cardiac rhythm.    Assessment/Plan: 47yo F group b strep septic arthritis s/p washout and abtx and implant.  Will place picc line order today to have it placed later today/tomorrow am.  Plan to switch to ceftriaxone, since we were able to get coverage for home. We will have home health teach administration.Marland Kitchen  Plan to discharge with 6 wk of ceftriaxone  ---------------------------------------------------  Diagnosis: Septic knee  Culture Result: group b strep  Allergies  Allergen Reactions   Shellfish Allergy Anaphylaxis and Itching   Cocos Nucifera Hives   Codeine Itching   Mango Flavor Itching    OPAT Orders Discharge antibiotics to be given via PICC line Discharge antibiotics: Per pharmacy protocol ceftriaxone 2gm IV daily  Duration: 6 wk End Date: 05/26/2023  [facility] Care Per Protocol:  Home health RN for IV administration and teaching; PICC line care and labs.    Labs weekly while on IV antibiotics: _x_ CBC with differential _x_ BMP _x_ CRP __x ESR   _x_ Please pull PIC at completion of IV antibiotics  Fax weekly  labs to [phone]  Clinic Follow Up Appt: 4-5 wk at [facility]  @ DR Pebbles Zeiders  I have personally spent 35 minutes involved in face-to-face and non-face-to-face activities for this patient on the day of the visit. Professional time spent includes the following activities: Preparing to see the patient (review of tests), Obtaining and/or reviewing separately obtained history (admission/discharge record), Performing a medically appropriate examination and/or evaluation , Ordering medications/tests/procedures, referring and communicating with other health care professionals, Documenting clinical information in the EMR, Independently interpreting results (not separately reported), Communicating results to the patient/family/caregiver, Counseling and educating the patient/family/caregiver and Care coordination (not separately reported).     [facility] Pager: [phone]  04/16/2023, 7:19 PM

## 2023-04-16 NOTE — TOC Progression Note (Signed)
Transition of Care [facility]) - Progression Note   Patient Details  Name: Sharon Faulkner MRN: [id number] Date of Birth: Nov 25, 1975  Transition of Care [facility]) CM/SW Contact  Ewing Schlein, LCSW Phone Number: 04/16/2023, 2:09 PM  Clinical Narrative: CSW delivered charity rolling walker to patient's room. CSW followed up with Pam with [employer] and patient is able to pay for the IV antibiotic, but will need an LOG for the [facility]. [facility] will be providing HHRN. TOC supervisor has completed LOG request for [facility]. CSW updated Pam with [employer]. CSW updated patient.  Expected Discharge Plan: Home w Home Health Services Barriers to Discharge: Continued Medical Work up, Inadequate or no insurance  Expected Discharge Plan and Services In-house Referral: Clinical Social Work Discharge Planning Services: Indigent Health Clinic, Medication Assistance, MATCH Program Post Acute Care Choice: Home Health Living arrangements for the past 2 months: Hotel/Motel             DME Arranged: Optometrist spoke with at DME Agency: Charity walker HH Arranged: RN, IV Antibiotics HH Agency: Surveyor, mining, Other - See comment ([facility] will provide [facility].) Date HH Agency Contacted: 04/15/23 Representative spoke with at [facility] Agency: Marchelle Folks)  Social Determinants of Health (SDOH) Interventions SDOH Screenings   Housing: Patient Declined (04/09/2023)  Tobacco Use: High Risk (04/14/2023)   Readmission Risk Interventions     No data to display

## 2023-04-16 NOTE — Progress Notes (Signed)
PROGRESS NOTE    Sharon Faulkner  NUU:[id number] DOB: [birth year] DOA: 04/08/2023 PCP: Patient, No Pcp Per   Brief Narrative:  The patient is a 47 year old morbidly obese African-American female with a past medical history significant for but not limited to asthma as well as other comorbidities who presented with right knee swelling and pain.  Orthopedic surgery was consulted and she is status post arthrocentesis.  Synovial fluid culture showed strep agalactiae and so ID was consulted and was placed on penicillin G.  She was found to have extensive osteomyelitis and infected tissue in the right knee joint that lead to irreversible damage almost complete cartilage degeneration.  Orthopedic surgery is following closely and plan for two-stage total knee arthroplasty with antibiotic placement and she underwent incision and drainage and also had a total knee arthroplasty is postoperative day 2.    ID is recommending a prolonged course of antibiotics.  Pain is relatively still uncontrolled and will need further adjustments.  Hospitalization has been complicated by acute on chronic anemia however patient is refused blood transfusions and wants to monitor her hemoglobin count for now.  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  No problem with ambulation at baseline.   -Underwent arthrocentesis in the emergency department.  Arthrocentesis showed streptococcal agalactiae.   -ID consulted.  No history of fever or chills. Currently on Penicillin G for antibiotic management however now infectious diseases are planning to change her to IV ceftriaxone on 04/16/2023 to get coverage for home and will have home teaching administration with a 6-week course duration for antibiotics -WBC Trend: Recent Labs  Lab 04/11/23 0320 04/12/23 0348 04/13/23 0332 04/14/23 0253 04/15/23 0311 04/15/23 1803 04/16/23 0322  WBC 6.6 7.4 7.7  8.4 14.0* 11.7* 10.7*  -MRI showed large joint effusion with high-grade synovial thickening and synovial enhancement, high-grade marrow edema . -Found to have extensive osteomyelitis, infected tissue in the knee joint that has lead to irreversible damage and almost complete cartilage degeneration.   -Orthopedics closely following, did arthoscopy, planning for two-stage total knee arthroplasty with antibiotic placement. -Blood cultures have not shown any growth so far.  Continue pain management, supportive care. -Patient underwent a arthroscopy of her right knee on 04/14/2023 as well as incision and drainage of the right knee with total knee arthroplasty done by Dr. Margarita Rana. -Continues to have quite a lot of pain which is severe and has not mobilized yet. -Orthopedic surgery has adjusted her pain regimen and adjusted her hydromorphone to every 2 as needed and added Celebrex as well as a Medrol Dosepak.  Taper initiated.  Currently she is on acetaminophen at 1000 mg p.o. every 6 scheduled as well as celecoxib 201 p.o. twice daily, IV hydromorphone 0.5 to 1 mg every 2 as needed for severe pain as well as p.o. oxycodone 5 to 10 mg every 4 as needed for moderate pain and 10 to 50 mg every 4 as needed severe pain -Patient was advised that today tomorrow will be the top of the day since her TKA since her nerve block will wear off; the weightbearing status is weightbearing as tolerated right lower extremity and are recommending leaving the dressings intact until follow-up and reinforcing dressings as needed -Currently holding her Lovenox 40 mg daily until her hemoglobin increases but recommended SCDs and ambulation -Patient is postoperative day 1 with incision and drainage implant for stability in the infectious diseases are recommending 6 weeks of  IV penicillin versus cefazolin per awaiting to see with charity care and can be approved; PICC line to be placed and is pending  -Patient also received Tranexamic  acid 1000 mg IV x 1 in the PACU -Patient having a CPM to be placed however was unable to be applied due to her pain level. -Orthopedic Surgery recommending advancing her diet and recommending upper therapy and encouraging her to present to the pain and continue with incentive spirometry as well as elevating the extremity and applying ice. -The transition of care team is arranging home health for the patient and the caseworker delivered a charity rolling walker to the patient's room; Pam with [facility] is working on antibiotics for discharge but the patient will need a allergy for the home health RN.  PT OT continue to work with the patient  History of Asthma -Currently not in exacerbation -Continue to Monitor respiratory status carefully  ABLA superimposed on Chronic Microcytic Anemia -Hgb/Hct Trend: Recent Labs  Lab 04/12/23 0348 04/13/23 0332 04/14/23 0253 04/14/23 1703 04/15/23 0311 04/15/23 1803 04/16/23 0322  HGB 7.5* 7.4* 7.6* 7.5* 6.6* 6.6* 6.4*  HCT 26.9* 27.1* 27.6* 22.0* 23.6* 23.8* 23.1*  MCV 69.0* 69.5* 70.1*  --  70.2* 70.4* 71.7*  -Iron Panel done and showed Iron Level of 24, UIBC of 350, TIBC of 374, and Saturation Ratios of 6% -She was attempted to be given a unit of blood during this hospitalization but had declined until this AM but has yet to be given -Continue to monitor for signs and symptoms of bleeding; no overt bleeding noted -Repeat CBC in the a.m.  Thrombocytosis -Likely reactive in the setting of her septic knee -Platelet Count Trend: Recent Labs  Lab 04/11/23 0320 04/12/23 0348 04/13/23 0332 04/14/23 0253 04/15/23 0311 04/15/23 1803 04/16/23 0322  PLT 507* PLATELET CLUMPS NOTED ON SMEAR, UNABLE TO ESTIMATE 538* 476* 541* 453* 404*  -Continue to monitor and repeat CBC in a.m.  Hypoalbuminemia -Patient's Albumin Trend: Recent Labs  Lab 04/09/23 0624 04/10/23 1610 04/11/23 0320 04/12/23 0348 04/13/23 0332 04/16/23 0322  ALBUMIN 2.8* 2.5*  2.4* 2.9* 2.9* 2.8*  -Continue to Monitor and Trend and repeat CMP in the AM  Morbid Obesity -Complicates overall prognosis and care -Estimated body mass index is 50.97 kg/m as calculated from the following:   Height as of this encounter: 5\' 4"  (1.626 m).   Weight as of this encounter: 134.7 kg.  -Weight Loss and Dietary Counseling given   DVT prophylaxis: SCDs Start: 04/14/23 1814 Place TED hose Start: 04/14/23 1814    Code Status: Full Code Family Communication: No family currently at bedside  Disposition Plan:  Level of care: Med-Surg Status is: Inpatient Remains inpatient appropriate because: Needs to have her PICC line placed and antibiotics set up for discharge along with her home health.  Anticipating discharge in next 24 to 48 hours if pain is relatively well-controlled and HH is set up   Consultants:  Infectious Diseases Orthopedic Surgery   Procedures:  INCISION AND DRAINAGE RIGHT KNEE, TOTAL KNEE ARTHROPLASTY Dr. Margarita Rana on 04/14/2023  Antimicrobials:  Anti-infectives (From admission, onward)    Start     Dose/Rate Route Frequency Ordered Stop   04/16/23 1215  cefTRIAXone (ROCEPHIN) 2 g in sodium chloride 0.9 % 100 mL IVPB        2 g 200 mL/hr over 30 Minutes Intravenous Daily 04/16/23 1119     04/14/23 1357  ceFAZolin (ANCEF) 3-0.9 GM/100ML-% IVPB  Note to Pharmacy: Ramond Craver R: cabinet override      04/14/23 1357 04/15/23 0214   04/14/23 1245  gentamicin (GARAMYCIN) injection 800 mg  Status:  Discontinued        800 mg Intramuscular  Once 04/14/23 1236 04/15/23 1025   04/12/23 1200  penicillin G potassium 12 Million Units in dextrose 5 % 500 mL CONTINUOUS infusion  Status:  Discontinued        12 Million Units 41.7 mL/hr over 12 Hours Intravenous Every 12 hours 04/12/23 1105 04/16/23 1119   04/10/23 2000  penicillin G potassium 12 Million Units in dextrose 5 % 500 mL CONTINUOUS infusion  Status:  Discontinued        12 Million Units 41.7  mL/hr over 12 Hours Intravenous Every 12 hours 04/10/23 1850 04/12/23 1107   04/10/23 0600  vancomycin (VANCOCIN) IVPB 1000 mg/200 mL premix  Status:  Discontinued        1,000 mg 200 mL/hr over 60 Minutes Intravenous Every 12 hours 04/09/23 1434 04/10/23 1850   04/09/23 1800  vancomycin (VANCOREADY) IVPB 2000 mg/400 mL        2,000 mg 200 mL/hr over 120 Minutes Intravenous  Once 04/09/23 1434 04/09/23 2300   04/09/23 1130  ceFAZolin (ANCEF) IVPB 2g/100 mL premix        2 g 200 mL/hr over 30 Minutes Intravenous On call to O.R. 04/09/23 1044 04/09/23 1531   04/08/23 2330  ceFAZolin (ANCEF) IVPB 2g/100 mL premix        2 g 200 mL/hr over 30 Minutes Intravenous  Once 04/08/23 2327 04/09/23 0036       Subjective: Seen and examined at bedside and states her pain is not as bad today.  Feels okay.  Worried about her blood count given that he continues to remain low.  Finally excepting a blood transfusion.  No nausea or vomiting.  No other concerns or complaints this time.  Objective: Vitals:   04/16/23 0500 04/16/23 0508 04/16/23 1121 04/16/23 1148  BP:  (!) 143/91 111/70 125/72  Pulse:  89 89 88  Resp:  18 16 16   Temp:  98.6 F (37 C) 98.1 F (36.7 C) 97.7 F (36.5 C)  TempSrc:  Oral Oral Oral  SpO2:  97% 97% 99%  Weight: 134.7 kg     Height:        Intake/Output Summary (Last 24 hours) at 04/16/2023 1521 Last data filed at 04/16/2023 1519 Gross per 24 hour  Intake 2860.61 ml  Output 2550 ml  Net 310.61 ml   Filed Weights   04/14/23 1141 04/15/23 0500 04/16/23 0500  Weight: 126.1 kg 126.2 kg 134.7 kg   Examination: Physical Exam:  Constitutional: WN/WD morbidly obese African-American female who appears a bit more comfortable today Respiratory: Diminished to auscultation bilaterally, no wheezing, rales, rhonchi or crackles. Normal respiratory effort and patient is not tachypenic. No accessory muscle use.  Unlabored breathing Cardiovascular: RRR, no murmurs / rubs / gallops. S1  and S2 auscultated.  Right knee is wrapped Abdomen: Soft, non-tender, distended secondary to body habitus. Bowel sounds positive.  GU: Deferred. Musculoskeletal: No clubbing / cyanosis of digits/nails. No joint deformity upper and lower extremities.  Skin: No rashes, lesions, ulcers on limited skin evaluation. No induration; Warm and dry.  Neurologic: CN 2-12 grossly intact with no focal deficits. Romberg sign and cerebellar reflexes not assessed.  Psychiatric: Normal judgment and insight. Alert and oriented x 3. Normal mood and appropriate affect.   Data Reviewed:  I have personally reviewed following labs and imaging studies  CBC: Recent Labs  Lab 04/13/23 0332 04/14/23 0253 04/14/23 1703 04/15/23 0311 04/15/23 1803 04/16/23 0322  WBC 7.7 8.4  --  14.0* 11.7* 10.7*  NEUTROABS  --   --   --   --  10.5* 8.9*  HGB 7.4* 7.6* 7.5* 6.6* 6.6* 6.4*  HCT 27.1* 27.6* 22.0* 23.6* 23.8* 23.1*  MCV 69.5* 70.1*  --  70.2* 70.4* 71.7*  PLT 538* 476*  --  541* 453* 404*   Basic Metabolic Panel: Recent Labs  Lab 04/10/23 0632 04/11/23 0320 04/12/23 0348 04/13/23 0332 04/14/23 1703 04/15/23 0311 04/16/23 0322  NA 137 139 139 137 138 135 138  K 4.1 3.8 3.9 4.3 4.2 4.0 4.5  CL 103 104 103 107  --  100 104  CO2 25 26 27 25   --  25 24  GLUCOSE 127* 104* 114* 104*  --  125* 143*  BUN 11 10 13 16   --  9 12  CREATININE 0.64 0.60 0.61 0.52  --  0.57 0.56  CALCIUM 8.9 8.9 8.7* 8.8*  --  8.9 9.1  MG 1.7 1.6* 2.0  --   --   --  1.8  PHOS  --   --   --   --   --   --  3.4   GFR: Estimated Creatinine Clearance: 117.7 mL/min (by C-G formula based on SCr of 0.56 mg/dL). Liver Function Tests: Recent Labs  Lab 04/10/23 1610 04/11/23 0320 04/12/23 0348 04/13/23 0332 04/16/23 0322  AST 14* 11* 13* 12* 16  ALT 10 9 9 10 12   ALKPHOS 61 58 61 64 58  BILITOT 0.5 0.3 0.3 <0.1* 0.2*  PROT 6.8 6.0* 6.9 6.9 6.7  ALBUMIN 2.5* 2.4* 2.9* 2.9* 2.8*   No results for input(s): "LIPASE", "AMYLASE" in  the last 168 hours. No results for input(s): "AMMONIA" in the last 168 hours. Coagulation Profile: No results for input(s): "INR", "PROTIME" in the last 168 hours. Cardiac Enzymes: No results for input(s): "CKTOTAL", "CKMB", "CKMBINDEX", "TROPONINI" in the last 168 hours. BNP (last 3 results) No results for input(s): "PROBNP" in the last 8760 hours. HbA1C: No results for input(s): "HGBA1C" in the last 72 hours. CBG: No results for input(s): "GLUCAP" in the last 168 hours. Lipid Profile: No results for input(s): "CHOL", "HDL", "LDLCALC", "TRIG", "CHOLHDL", "LDLDIRECT" in the last 72 hours. Thyroid Function Tests: No results for input(s): "TSH", "T4TOTAL", "FREET4", "T3FREE", "THYROIDAB" in the last 72 hours. Anemia Panel: No results for input(s): "VITAMINB12", "FOLATE", "FERRITIN", "TIBC", "IRON", "RETICCTPCT" in the last 72 hours.  Sepsis Labs: No results for input(s): "PROCALCITON", "LATICACIDVEN" in the last 168 hours.  Recent Results (from the past 240 hour(s))  Body fluid culture w Gram Stain     Status: None   Collection Time: 04/08/23  6:34 PM   Specimen: KNEE; Body Fluid  Result Value Ref Range Status   Specimen Description   Final    KNEE Performed at [facility], [address] [place], [place] [address]    Special Requests   Final    NONE Performed at [facility], [address] [place], [place] [address]    Gram Stain   Final    ABUNDANT WBC PRESENT, PREDOMINANTLY PMN RARE GRAM POSITIVE COCCI IN PAIRS IN CHAINS Gram Stain Report Called to,Read Back By and Verified With: RN . A. Normand Sloop [id number] @ 2236 FH    Culture  Final    FEW GROUP B STREP(S.AGALACTIAE)ISOLATED TESTING AGAINST S. AGALACTIAE NOT ROUTINELY PERFORMED DUE TO PREDICTABILITY OF AMP/PEN/VAN SUSCEPTIBILITY. Performed at [facility], [address], [place], [place] [address]    Report Status 04/10/2023 FINAL  Final  Culture, blood (Routine X 2) w Reflex to ID Panel      Status: None   Collection Time: 04/09/23  6:19 AM   Specimen: BLOOD RIGHT HAND  Result Value Ref Range Status   Specimen Description BLOOD RIGHT HAND  Final   Special Requests   Final    BOTTLES DRAWN AEROBIC ONLY Blood Culture results may not be optimal due to an inadequate volume of blood received in culture bottles   Culture   Final    NO GROWTH 5 DAYS Performed at [facility], [address] [place], [place] [address]    Report Status 04/14/2023 FINAL  Final  Culture, blood (Routine X 2) w Reflex to ID Panel     Status: None   Collection Time: 04/09/23  8:06 AM   Specimen: BLOOD  Result Value Ref Range Status   Specimen Description BLOOD SITE NOT SPECIFIED  Final   Special Requests   Final    BOTTLES DRAWN AEROBIC AND ANAEROBIC Blood Culture results may not be optimal due to an inadequate volume of blood received in culture bottles   Culture   Final    NO GROWTH 5 DAYS Performed at [facility], [address], [place], [place] [address]    Report Status 04/14/2023 FINAL  Final  Aerobic/Anaerobic Culture w Gram Stain (surgical/deep wound)     Status: None   Collection Time: 04/09/23  3:28 PM   Specimen: Abscess  Result Value Ref Range Status   Specimen Description ABSCESS  Final   Special Requests right knee  Final   Gram Stain   Final    ABUNDANT WBC PRESENT, PREDOMINANTLY PMN NO ORGANISMS SEEN    Culture   Final    MODERATE GROUP B STREP(S.AGALACTIAE)ISOLATED TESTING AGAINST S. AGALACTIAE NOT ROUTINELY PERFORMED DUE TO PREDICTABILITY OF AMP/PEN/VAN SUSCEPTIBILITY. NO ANAEROBES ISOLATED Performed at [facility], [address], [place], [place] [address]    Report Status 04/14/2023 FINAL  Final  Surgical pcr screen     Status: None   Collection Time: 04/14/23 11:40 AM   Specimen: Nasal Mucosa; Nasal Swab  Result Value Ref Range Status   MRSA, PCR NEGATIVE NEGATIVE Final   Staphylococcus aureus NEGATIVE NEGATIVE Final    Comment: (NOTE) The  Xpert SA Assay (FDA approved for NASAL specimens in patients 76 years of age and older), is one component of a comprehensive surveillance program. It is not intended to diagnose infection nor to guide or monitor treatment. Performed at [facility], [address] [place], [place] [address]     Radiology Studies: [place] EKG SITE RITE  Result Date: 04/16/2023 If Site Rite image not attached, placement could not be confirmed due to current cardiac rhythm.  [place] EKG SITE RITE  Result Date: 04/15/2023 If Site Rite image not attached, placement could not be confirmed due to current cardiac rhythm.  DG Knee Right Port  Result Date: 04/14/2023 CLINICAL DATA:  Status post total right knee arthroplasty. Osteomyelitis of right knee region. Septic arthritis of right knee. EXAM: PORTABLE RIGHT KNEE - 1-2 VIEW COMPARISON:  Right knee radiographs 04/08/2023, MRI right knee 04/09/2023 FINDINGS: Interval total right knee arthroplasty with metallic femoral component and lower  density tibial component. Antibiotic beads are seen throughout the tibial tunnel common femoral tunnel, and medial and suprapatellar compartment. Moderate joint effusion. Mild subcutaneous and intra-articular air. No cortical erosion is seen. IMPRESSION: Interval total right knee arthroplasty, presumably for resection of the prior medial greater than lateral compartment articular surface septic arthritis/osteomyelitis, with antibiotic beads in place. Moderate joint effusion. Electronically Signed   By: Neita Garnet M.D.   On: 04/14/2023 19:25    Scheduled Meds:  sodium chloride   Intravenous Once   sodium chloride   Intravenous Once   celecoxib  200 mg Oral BID   Chlorhexidine Gluconate Cloth  6 each Topical Daily   docusate sodium  100 mg Oral BID   ferrous sulfate  325 mg Oral Daily   pantoprazole  40 mg Oral Daily   predniSONE  10 mg Oral 3 x daily with food   [START ON 04/17/2023] predniSONE  10 mg Oral 4X daily taper    predniSONE  20 mg Oral Nightly   sodium chloride flush  3 mL Intravenous Q12H   Continuous Infusions:  sodium chloride     sodium chloride 40 mL/hr at 04/16/23 0418   cefTRIAXone (ROCEPHIN)  IV 2 g (04/16/23 1248)   methocarbamol (ROBAXIN) IV      LOS: 7 days   Marguerita Merles, DO [facility] Available via Epic secure chat 7am-7pm After these hours, please refer to coverage provider listed on [email or web address] 04/16/2023, 3:21 PM

## 2023-04-16 NOTE — Progress Notes (Signed)
Orthopedic Tech Progress Note Patient Details:  Sharon Faulkner 28-Mar-1976 [id number]  CPM Right Knee CPM Right Knee: Off Right Knee Flexion (Degrees): 30 Right Knee Extension (Degrees): 0  Post Interventions Patient Tolerated: Fair Instructions Provided: Adjustment of device, Care of device  [name] 04/16/2023, 6:39 PM

## 2023-04-16 NOTE — Progress Notes (Signed)
Orthopedic Tech Progress Note Patient Details:  Sharon Faulkner [birth year] [id number]  CPM Right Knee CPM Right Knee: On Right Knee Flexion (Degrees): 30 Right Knee Extension (Degrees): 0  Post Interventions Patient Tolerated: Poor Instructions Provided: Adjustment of device, Care of device  [name] 04/16/2023, 3:30 PM

## 2023-04-16 NOTE — Progress Notes (Signed)
Peripherally Inserted Central Catheter Placement  The IV Nurse has discussed with the patient and/or persons authorized to consent for the patient, the purpose of this procedure and the potential benefits and risks involved with this procedure.  The benefits include less needle sticks, lab draws from the catheter, and the patient may be discharged home with the catheter. Risks include, but not limited to, infection, bleeding, blood clot (thrombus formation), and puncture of an artery; nerve damage and irregular heartbeat and possibility to perform a PICC exchange if needed/ordered by physician.  Alternatives to this procedure were also discussed.  Bard Power PICC patient education guide, fact sheet on infection prevention and patient information card has been provided to patient /or left at bedside.    PICC Placement Documentation  PICC Single Lumen 04/16/23 Right Basilic 40 cm 1 cm (Active)  Indication for Insertion or Continuance of Line Home intravenous therapies (PICC only) 04/16/23 1610  Exposed Catheter (cm) 1 cm 04/16/23 1610  Site Assessment Clean, Dry, Intact 04/16/23 1610  Line Status Flushed;Saline locked;Blood return noted 04/16/23 1610  Dressing Type Transparent;Securing device 04/16/23 1610  Dressing Status Antimicrobial disc in place;Clean, Dry, Intact 04/16/23 1610  Line Adjustment (NICU/IV Team Only) No 04/16/23 1610  Dressing Intervention New dressing;Adhesive placed at insertion site (IV team only);Other (Comment) 04/16/23 1610  Dressing Change Due 04/23/23 04/16/23 1610       Reginia Forts Albarece 04/16/2023, 5:00 PM

## 2023-04-16 NOTE — Plan of Care (Signed)

## 2023-04-17 DIAGNOSIS — A491 Streptococcal infection, unspecified site: Secondary | ICD-10-CM

## 2023-04-17 LAB — CBC WITH DIFFERENTIAL/PLATELET
Abs Immature Granulocytes: 0.24 10*3/uL — ABNORMAL HIGH (ref 0.00–0.07)
Basophils Absolute: 0.1 10*3/uL (ref 0.0–0.1)
Basophils Relative: 0 %
Eosinophils Absolute: 0.1 10*3/uL (ref 0.0–0.5)
Eosinophils Relative: 1 %
HCT: 23.8 % — ABNORMAL LOW (ref 36.0–46.0)
Hemoglobin: 6.8 g/dL — CL (ref 12.0–15.0)
Immature Granulocytes: 2 %
Lymphocytes Relative: 18 %
Lymphs Abs: 2 10*3/uL (ref 0.7–4.0)
MCH: 21.3 pg — ABNORMAL LOW (ref 26.0–34.0)
MCHC: 28.6 g/dL — ABNORMAL LOW (ref 30.0–36.0)
MCV: 74.6 fL — ABNORMAL LOW (ref 80.0–100.0)
Monocytes Absolute: 0.9 10*3/uL (ref 0.1–1.0)
Monocytes Relative: 8 %
Neutro Abs: 8 10*3/uL — ABNORMAL HIGH (ref 1.7–7.7)
Neutrophils Relative %: 71 %
Platelets: 403 10*3/uL — ABNORMAL HIGH (ref 150–400)
RBC: 3.19 MIL/uL — ABNORMAL LOW (ref 3.87–5.11)
RDW: 23.5 % — ABNORMAL HIGH (ref 11.5–15.5)
WBC: 11.3 10*3/uL — ABNORMAL HIGH (ref 4.0–10.5)
nRBC: 0.4 % — ABNORMAL HIGH (ref 0.0–0.2)

## 2023-04-17 LAB — MAGNESIUM: Magnesium: 1.7 mg/dL (ref 1.7–2.4)

## 2023-04-17 LAB — COMPREHENSIVE METABOLIC PANEL
ALT: 13 U/L (ref 0–44)
AST: 15 U/L (ref 15–41)
Albumin: 2.8 g/dL — ABNORMAL LOW (ref 3.5–5.0)
Alkaline Phosphatase: 57 U/L (ref 38–126)
Anion gap: 11 (ref 5–15)
BUN: 17 mg/dL (ref 6–20)
CO2: 25 mmol/L (ref 22–32)
Calcium: 8.5 mg/dL — ABNORMAL LOW (ref 8.9–10.3)
Chloride: 104 mmol/L (ref 98–111)
Creatinine, Ser: 0.55 mg/dL (ref 0.44–1.00)
GFR, Estimated: >60 mL/min (ref >60)
Glucose, Bld: 116 mg/dL — ABNORMAL HIGH (ref 70–99)
Potassium: 3.6 mmol/L (ref 3.5–5.1)
Sodium: 140 mmol/L (ref 135–145)
Total Bilirubin: 0.3 mg/dL (ref 0.3–1.2)
Total Protein: 6.2 g/dL — ABNORMAL LOW (ref 6.5–8.1)

## 2023-04-17 LAB — PHOSPHORUS: Phosphorus: 3.6 mg/dL (ref 2.5–4.6)

## 2023-04-17 MED ORDER — MAGNESIUM SULFATE 2 GM/50ML IV SOLN
2.0000 g | Freq: Once | INTRAVENOUS | Status: AC
  Administered 2023-04-17: 2 g via INTRAVENOUS
  Filled 2023-04-17: qty 50

## 2023-04-17 MED ORDER — SODIUM CHLORIDE 0.9% IV SOLUTION: Freq: Once | INTRAVENOUS | Status: AC

## 2023-04-17 NOTE — TOC Progression Note (Signed)
Transition of Care [facility]) - Progression Note   Patient Details  Name: Sharon Faulkner MRN: [id number] Date of Birth: 1976/01/14  [facility]) CM/SW Contact  Ewing Schlein, LCSW Phone Number: 04/17/2023, 12:56 PM  Clinical Narrative: OPAT orders have been signed. CSW made HHPT charity referral to Amy with [facility], which was accepted pending patient being set up with a PCP. Patient agreeable to referral to a [facility] clinic. Patient is set up a [facility]. The hospital follow up is scheduled for Thursday May 07, 2023 at 9:30am. Patient will see Dr. Alvis Lemmings. Patient updated. HH orders placed [facility] for [facility] and HHPT for [facility]).  Expected Discharge Plan: Home w Home Health Services Barriers to Discharge: Continued Medical Work up, Inadequate or no insurance  Expected Discharge Plan and Services In-house Referral: Clinical Social Work Discharge Planning Services: Indigent Health Clinic, Medication Assistance, MATCH Program Post Acute Care Choice: Home Health Living arrangements for the past 2 months: Hotel/Motel           DME Arranged: Optometrist spoke with at DME Agency: Charity walker HH Arranged: RN, IV Antibiotics HH Agency: Surveyor, mining, Other - See comment ([facility] will provide [facility].) Date HH Agency Contacted: 04/15/23 Representative spoke with at [facility] Agency: Marchelle Folks)  Social Determinants of Health (SDOH) Interventions SDOH Screenings   Housing: Patient Declined (04/09/2023)  Tobacco Use: High Risk (04/14/2023)   Readmission Risk Interventions     No data to display

## 2023-04-17 NOTE — Plan of Care (Signed)

## 2023-04-17 NOTE — Progress Notes (Signed)
Occupational Therapy Treatment Patient Details Name: Sharon Faulkner MRN: [id number] DOB: [birth year] Today's Date: 04/17/2023   History of present illness 47 yr old female who presented to ED with R knee and leg pain and swelling that has been present since 7/28. Prior ED visit 7/31 x-ray was clear of fracture, but showed effusion. Unable to draw any fluid then 8/28 R knee aspirated with small volume of purulent fluid attained. Pt now s/p 8/29 R knee arthroscopic I&D which revealed chronic infection and component of osteomyelitis. s/p I&D and TKA 04/14/23.  PMH: Asthma and PE not on anticoagulation.   OT comments  The pt reported having 8/10 R knee pain at rest. Due to increased pain, pt was seen at bed level only. OT reinforced proper RLE/knee positioning in bed, as well as the importance of regularly implementing ROM/exercises. OT also further educated the pt on the use of adaptive equipment for performing lower body dressing tasks, specifically a reacher to doff socks, sock aid to don socks, and reacher to donn lower body clothing articles such as pants & underwear. Continue OT plan of care.         If plan is discharge home, recommend the following:  Assistance with cooking/housework;Assist for transportation;A little help with bathing/dressing/bathroom   Equipment Recommendations  Other (comment)    Recommendations for Other Services      Precautions / Restrictions Precautions Precautions: Fall;Knee Restrictions Weight Bearing Restrictions: No RLE Weight Bearing: Weight bearing as tolerated Other Position/Activity Restrictions: KI on out of bed       Mobility Bed Mobility      General bed mobility comments: Pt gently deferred, due to increased R knee pain           ADL either performed or assessed with clinical judgement   ADL Overall ADL's : Needs assistance/impaired Eating/Feeding: Independent;Sitting Eating/Feeding Details (indicate cue type and reason): based on  clinical judgement Grooming: Set up;Sitting         Lower Body Bathing Details (indicate cue type and reason): OT educated the pt on the use of shower seat, long handled sponge, and hand-held shower hose as equipment options for bathing tasks. Upper Body Dressing : Set up;Sitting   Lower Body Dressing: Moderate assistance;Maximal assistance Lower Body Dressing Details (indicate cue type and reason): OT provided further education to the pt with regards to performing lower body dressing. She was instructed on use of a reacher to doff socks, sock aid to don socks, and reacher to donn lower body clothing articles such as pants & underwear.                     Cognition Arousal: Alert Behavior During Therapy: WFL for tasks assessed/performed Overall Cognitive Status: Within Functional Limits for tasks assessed                      Pertinent Vitals/ Pain       Pain Assessment Pain Assessment: 0-10 Pain Score: 8  Pain Location: R knee Pain Intervention(s): RN gave pain meds during session, Patient requesting pain meds-RN notified, Monitored during session         Frequency  Min 1X/week        Progress Toward Goals  OT Goals(current goals can now be found in the care plan section)     Acute Rehab OT Goals Patient Stated Goal: decreased pain OT Goal Formulation: With patient Time For Goal Achievement: 04/29/23 Potential to Achieve Goals: Good  Plan  AM-PAC OT "6 Clicks" Daily Activity     Outcome Measure   Help from another person eating meals?: None Help from another person taking care of personal grooming?: A Little Help from another person toileting, which includes using toliet, bedpan, or urinal?: A Little Help from another person bathing (including washing, rinsing, drying)?: A Lot Help from another person to put on and taking off regular upper body clothing?: None Help from another person to put on and taking off regular lower body clothing?:  A Lot 6 Click Score: 18    End of Session Equipment Utilized During Treatment: Other (comment) (N/A)  OT Visit Diagnosis: Unsteadiness on feet (R26.81);Other abnormalities of gait and mobility (R26.89);Pain Pain - Right/Left: Right Pain - part of body: Knee   Activity Tolerance Patient limited by pain   Patient Left in bed;with call bell/phone within reach   Nurse Communication Patient requests pain meds        Time: 4332-9518 OT Time Calculation (min): 20 min  Charges: OT General Charges $OT Visit: 1 Visit OT Treatments $Self Care/Home Management : 8-22 mins     Reuben Likes, OTR/L 04/17/2023, 1:28 PM

## 2023-04-17 NOTE — Progress Notes (Signed)
PHYSICAL THERAPY  Pt declined her am PT session due to "cramps" from starting her period. Will attempt again this afternoon.  Felecia Shelling  PTA Acute  Rehabilitation Services Office M-F          [phone]

## 2023-04-17 NOTE — Progress Notes (Addendum)
15:20 Started 1 unit of PRBC transfusion. No adverse reactions noted, vital signs taken and recorded per protocol.  18:30 Pt completed blood transfusion, no reactions noted, vital signs taken and recorded.

## 2023-04-17 NOTE — Progress Notes (Incomplete)
Subjective: Patient reports pain as severe but slightly better than yesterday. Refusing to move knee much. Tolerating diet.  Urinating.   No CP, SOB.  Hasn't mobilized OOB much with PT.   Received a blood transfusion yesterday but Hbg remains low. May need another.  Objective:   VITALS:   Vitals:   04/16/23 2249 04/17/23 0500 04/17/23 0651 04/17/23 0921  BP: (!) 143/101  (!) 161/103 (!) 160/87  Pulse: 88  76 83  Resp: 18  18 18   Temp: 98.5 F (36.9 C)  98.3 F (36.8 C) 98.4 F (36.9 C)  TempSrc: Oral  Oral Oral  SpO2: 99%  99% 100%  Weight:  (!) 137.5 kg    Height:          Latest Ref Rng & Units 04/17/2023   10:07 AM 04/16/2023    7:46 PM 04/16/2023    3:22 AM  CBC  WBC 4.0 - 10.5 K/uL 11.3   10.7   Hemoglobin 12.0 - 15.0 g/dL 6.8  7.1  6.4   Hematocrit 36.0 - 46.0 % 23.8  24.5  23.1   Platelets 150 - 400 K/uL 403   404       Latest Ref Rng & Units 04/17/2023   10:07 AM 04/16/2023    3:22 AM 04/15/2023    3:11 AM  BMP  Glucose 70 - 99 mg/dL 829  562  130   BUN 6 - 20 mg/dL 17  12  9    Creatinine 0.44 - 1.00 mg/dL 8.65  7.84  6.96   Sodium 135 - 145 mmol/L 140  138  135   Potassium 3.5 - 5.1 mmol/L 3.6  4.5  4.0   Chloride 98 - 111 mmol/L 104  104  100   CO2 22 - 32 mmol/L 25  24  25    Calcium 8.9 - 10.3 mg/dL 8.5  9.1  8.9    Intake/Output      09/05 0701 09/06 0700 09/06 0701 09/07 0700   P.O. 240    I.V. (mL/kg) 427.2 (3.1)    Blood 534    IV Piggyback 502.4    Total Intake(mL/kg) 1703.6 (12.4)    Urine (mL/kg/hr) 1150 (0.3) 800 (0.9)   Stool 0    Total Output 1150 800   Net +553.6 -800        Urine Occurrence 2 x    Stool Occurrence 2 x       Physical Exam: General: NAD.  Laying in bed, calm Resp: No increased wob Cardio: regular rate and rhythm ABD soft Neurologically intact MSK Neurovascularly intact Sensation intact distally Intact pulses distally Dorsiflexion/Plantar flexion intact Incision: dressing C/D/I   Assessment: 3 Days  Post-Op  S/P Procedure(s) (LRB): INCISION AND DRAINAGE RIGHT KNEE (Right) TOTAL KNEE ARTHROPLASTY (Right) by Dr. Jewel Baize. Murphy on 04/14/23  Principal Problem:   Septic arthritis of knee, right (HCC) Active Problems:   Asthma   Chronic anemia   Arthritis, septic, knee (HCC)   Group B streptococcal infection   Plan: No more use of KI. Must be flexing knee. KI was only to be used in first 48 hours as nerve block wore off.  Advance diet Up with therapy, encourage to push through the pain Incentive Spirometry Elevate and Apply ice  Weightbearing: WBAT RLE Insicional and dressing care: Dressings left intact until follow-up and Reinforce dressings as needed Orthopedic device(s):  CPM Showering: Keep dressing dry VTE prophylaxis: Lovenox 40mg  qd but hold until Hgb increases,  SCDs, ambulation Pain control: PRN Follow - up plan:  TBD Contact information:  [name], [name]  Dispo:  TBD  based on PT evaluations which have been very limited thus far.      Jenne Pane, [name] Office [phone] 04/17/2023, 1:08 PM

## 2023-04-17 NOTE — Progress Notes (Signed)
PROGRESS NOTE    Sharon Faulkner  VHQ:[id number] DOB: 1976/05/20 DOA: 04/08/2023 PCP: Patient, No Pcp Per   Brief Narrative:  The patient is a 47 year old morbidly obese African-American female with a past medical history significant for but not limited to asthma as well as other comorbidities who presented with right knee swelling and pain.  Orthopedic surgery was consulted and she is status post arthrocentesis.  Synovial fluid culture showed strep agalactiae and so ID was consulted and was placed on penicillin G.  She was found to have extensive osteomyelitis and infected tissue in the right knee joint that lead to irreversible damage almost complete cartilage degeneration.  Orthopedic surgery is following closely and plan for two-stage total knee arthroplasty with antibiotic placement and she underwent incision and drainage and also had a total knee arthroplasty is postoperative day 2.    ID is recommending a prolonged course of antibiotics.  Pain is relatively still uncontrolled and will need further adjustments and is now improving.  Hospitalization has been complicated by acute on chronic anemia however patient had refused blood transfusion up until yesterday and has now agreeable and is transfuse 1 unit PRBCs yesterday will need to PRBCs today given that her hemoglobin is less than 7 again.  If hemoglobin stabilizes and pain is relatively well-controlled she can be discharged the next 24 to 48 hours.  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  No problem with ambulation at baseline.   -Underwent arthrocentesis in the emergency department.  Arthrocentesis showed streptococcal agalactiae.   -ID consulted.  No history of fever or chills. Currently on Penicillin G for antibiotic management however now infectious diseases are planning to change her to IV ceftriaxone on 04/16/2023 to get coverage for home and will  have home teaching administration with a 6-week course duration for antibiotics -WBC Trend: Recent Labs  Lab 04/12/23 0348 04/13/23 0332 04/14/23 0253 04/15/23 0311 04/15/23 1803 04/16/23 0322 04/17/23 1007  WBC 7.4 7.7 8.4 14.0* 11.7* 10.7* 11.3*  -MRI showed large joint effusion with high-grade synovial thickening and synovial enhancement, high-grade marrow edema . -Found to have extensive osteomyelitis, infected tissue in the knee joint that has lead to irreversible damage and almost complete cartilage degeneration.   -Orthopedics closely following, did arthoscopy, planning for two-stage total knee arthroplasty with antibiotic placement. -Blood cultures have not shown any growth so far.  Continue pain management, supportive care. -Patient underwent a arthroscopy of her right knee on 04/14/2023 as well as incision and drainage of the right knee with total knee arthroplasty done by Dr. Margarita Rana. -Continues to have quite a lot of pain which is severe and has not mobilized yet. -Orthopedic surgery has adjusted her pain regimen and adjusted her hydromorphone to every 2 as needed and added Celebrex as well as a Medrol Dosepak.  Taper initiated.  Currently she is on acetaminophen at 1000 mg p.o. every 6 scheduled as well as celecoxib 201 p.o. twice daily, IV hydromorphone 0.5 to 1 mg every 2 as needed for severe pain as well as p.o. oxycodone 5 to 10 mg every 4 as needed for moderate pain and 10 to 50 mg every 4 as needed severe pain -Patient was advised that today tomorrow will be the top of the day since her TKA since her nerve block will wear off; the weightbearing status is weightbearing as tolerated right lower extremity and are recommending leaving the dressings intact until follow-up  and reinforcing dressings as needed -Currently holding her Lovenox 40 mg daily until her hemoglobin increases but recommended SCDs and ambulation -Patient is postoperative day 1 with incision and drainage  implant for stability in the infectious diseases are recommending 6 weeks of IV penicillin versus cefazolin per awaiting to see with charity care and can be approved; PICC line to be placed and is pending  -Patient also received Tranexamic acid 1000 mg IV x 1 in the PACU -Patient having a CPM to be placed however was unable to be applied due to her pain level. -Orthopedic Surgery recommending advancing her diet and recommending upper therapy and encouraging her to present to the pain and continue with incentive spirometry as well as elevating the extremity and applying ice. -The transition of care team is arranging home health for the patient and the caseworker delivered a charity rolling walker to the patient's room; Pam with [facility] is working on antibiotics for discharge but the patient will need a allergy for the home health RN.  PT OT continue to work with the patient and she will need a rolling walker and home health and charity home health is being set up for the patient  History of Asthma -Currently not in exacerbation -Continue to Monitor respiratory status carefully  ABLA superimposed on Chronic Microcytic Anemia -Hgb/Hct Trend: Recent Labs  Lab 04/14/23 0253 04/14/23 1703 04/15/23 0311 04/15/23 1803 04/16/23 0322 04/16/23 1946 04/17/23 1007  HGB 7.6* 7.5* 6.6* 6.6* 6.4* 7.1* 6.8*  HCT 27.6* 22.0* 23.6* 23.8* 23.1* 24.5* 23.8*  MCV 70.1*  --  70.2* 70.4* 71.7*  --  74.6*  -Iron Panel done and showed Iron Level of 24, UIBC of 350, TIBC of 374, and Saturation Ratios of 6% -She was attempted to be given a unit of blood during this hospitalization but had declined until yesterday AM and was transfused 1 unit of PRBCs.  Given that blood count continues to remain low we will transfuse another 1 unit PRBCs that she is agreeable -Continue to monitor for signs and symptoms of bleeding; patient is now on her period as well -Repeat CBC in the a.m.  Thrombocytosis, improved -Likely  reactive in the setting of her septic knee -Platelet Count Trend: Recent Labs  Lab 04/12/23 0348 04/13/23 0332 04/14/23 0253 04/15/23 0311 04/15/23 1803 04/16/23 0322 04/17/23 1007  PLT PLATELET CLUMPS NOTED ON SMEAR, UNABLE TO ESTIMATE 538* 476* 541* 453* 404* 403*  -Continue to monitor and repeat CBC in a.m.  Hypoalbuminemia -Patient's Albumin Trend: Recent Labs  Lab 04/09/23 0624 04/10/23 5784 04/11/23 0320 04/12/23 0348 04/13/23 0332 04/16/23 0322 04/17/23 1007  ALBUMIN 2.8* 2.5* 2.4* 2.9* 2.9* 2.8* 2.8*  -Continue to Monitor and Trend and repeat CMP in the AM  Morbid Obesity -Complicates overall prognosis and care -Estimated body mass index is 52.03 kg/m as calculated from the following:   Height as of this encounter: 5\' 4"  (1.626 m).   Weight as of this encounter: 137.5 kg.  -Weight Loss and Dietary Counseling given   DVT prophylaxis: SCDs Start: 04/14/23 1814 Place TED hose Start: 04/14/23 1814    Code Status: Full Code Family Communication: No family currently at bedside  Disposition Plan:  Level of care: Med-Surg Status is: Inpatient Remains inpatient appropriate because: Needs further clinical improvement and stabilization of her blood count prior to discharge.  Pain will need to be relatively well-controlled as well  Consultants:  Infectious Diseases Orthopedic Surgery  Procedures:  INCISION AND DRAINAGE RIGHT KNEE, TOTAL KNEE ARTHROPLASTY  Dr. Margarita Rana on 04/14/2023   Antimicrobials:  Anti-infectives (From admission, onward)    Start     Dose/Rate Route Frequency Ordered Stop   04/16/23 1215  cefTRIAXone (ROCEPHIN) 2 g in sodium chloride 0.9 % 100 mL IVPB        2 g 200 mL/hr over 30 Minutes Intravenous Daily 04/16/23 1119     04/14/23 1357  ceFAZolin (ANCEF) 3-0.9 GM/100ML-% IVPB       Note to Pharmacy: Ramond Craver R: cabinet override      04/14/23 1357 04/15/23 0214   04/14/23 1245  gentamicin (GARAMYCIN) injection 800 mg  Status:   Discontinued        800 mg Intramuscular  Once 04/14/23 1236 04/15/23 1025   04/12/23 1200  penicillin G potassium 12 Million Units in dextrose 5 % 500 mL CONTINUOUS infusion  Status:  Discontinued        12 Million Units 41.7 mL/hr over 12 Hours Intravenous Every 12 hours 04/12/23 1105 04/16/23 1119   04/10/23 2000  penicillin G potassium 12 Million Units in dextrose 5 % 500 mL CONTINUOUS infusion  Status:  Discontinued        12 Million Units 41.7 mL/hr over 12 Hours Intravenous Every 12 hours 04/10/23 1850 04/12/23 1107   04/10/23 0600  vancomycin (VANCOCIN) IVPB 1000 mg/200 mL premix  Status:  Discontinued        1,000 mg 200 mL/hr over 60 Minutes Intravenous Every 12 hours 04/09/23 1434 04/10/23 1850   04/09/23 1800  vancomycin (VANCOREADY) IVPB 2000 mg/400 mL        2,000 mg 200 mL/hr over 120 Minutes Intravenous  Once 04/09/23 1434 04/09/23 2300   04/09/23 1130  ceFAZolin (ANCEF) IVPB 2g/100 mL premix        2 g 200 mL/hr over 30 Minutes Intravenous On call to O.R. 04/09/23 1044 04/09/23 1531   04/08/23 2330  ceFAZolin (ANCEF) IVPB 2g/100 mL premix        2 g 200 mL/hr over 30 Minutes Intravenous  Once 04/08/23 2327 04/09/23 0036       Subjective: Seen and examined at bedside and blood count had dropped again.  She is having some bleeding from her period.  Continues to have some knee pain and felt a little uncomfortable.  States that she does not really want to move given the pain.  No nausea or vomiting.  Denies any lightheadedness or dizziness.  No other concerns or complaints this time.  Objective: Vitals:   04/17/23 0921 04/17/23 1344 04/17/23 1516 04/17/23 1533  BP: (!) 160/87 (!) 143/91 132/88 130/88  Pulse: 83 86 82 80  Resp: 18 18 18 18   Temp: 98.4 F (36.9 C) 97.7 F (36.5 C) 99.3 F (37.4 C) 99.1 F (37.3 C)  TempSrc: Oral Oral Oral Oral  SpO2: 100% 96% 97% 97%  Weight:      Height:        Intake/Output Summary (Last 24 hours) at 04/17/2023 1719 Last data  filed at 04/17/2023 1542 Gross per 24 hour  Intake 1174.86 ml  Output 1300 ml  Net -125.14 ml   Filed Weights   04/15/23 0500 04/16/23 0500 04/17/23 0500  Weight: 126.2 kg 134.7 kg (!) 137.5 kg   Examination: Physical Exam:  Constitutional: WN/WD morbidly obese African-American female who is slightly uncomfortable Respiratory: Diminished to auscultation bilaterally, no wheezing, rales, rhonchi or crackles. Normal respiratory effort and patient is not tachypenic. No accessory muscle use.  Unlabored breathing Cardiovascular: RRR,  no murmurs / rubs / gallops. S1 and S2 auscultated.  Right leg is wrapped Abdomen: Soft, non-tender, distended secondary to body habitus. Bowel sounds positive.  GU: Deferred.  She is on a pure wick and has blood in the canister from her. Musculoskeletal: No clubbing / cyanosis of digits/nails.  Right leg is wrapped Skin: No rashes, lesions, ulcers on limited skin evaluation. No induration; Warm and dry.  Neurologic: CN 2-12 grossly intact with no focal deficits. Romberg sign and cerebellar reflexes not assessed.  Psychiatric: Normal judgment and insight. Alert and oriented x 3. Normal mood and appropriate affect.   Data Reviewed: I have personally reviewed following labs and imaging studies  CBC: Recent Labs  Lab 04/14/23 0253 04/14/23 1703 04/15/23 0311 04/15/23 1803 04/16/23 0322 04/16/23 1946 04/17/23 1007  WBC 8.4  --  14.0* 11.7* 10.7*  --  11.3*  NEUTROABS  --   --   --  10.5* 8.9*  --  8.0*  HGB 7.6*   < > 6.6* 6.6* 6.4* 7.1* 6.8*  HCT 27.6*   < > 23.6* 23.8* 23.1* 24.5* 23.8*  MCV 70.1*  --  70.2* 70.4* 71.7*  --  74.6*  PLT 476*  --  541* 453* 404*  --  403*   < > = values in this interval not displayed.   Basic Metabolic Panel: Recent Labs  Lab 04/11/23 0320 04/12/23 0348 04/13/23 0332 04/14/23 1703 04/15/23 0311 04/16/23 0322 04/17/23 1007  NA 139 139 137 138 135 138 140  K 3.8 3.9 4.3 4.2 4.0 4.5 3.6  CL 104 103 107  --  100  104 104  CO2 26 27 25   --  25 24 25   GLUCOSE 104* 114* 104*  --  125* 143* 116*  BUN 10 13 16   --  9 12 17   CREATININE 0.60 0.61 0.52  --  0.57 0.56 0.55  CALCIUM 8.9 8.7* 8.8*  --  8.9 9.1 8.5*  MG 1.6* 2.0  --   --   --  1.8 1.7  PHOS  --   --   --   --   --  3.4 3.6   GFR: Estimated Creatinine Clearance: 119.2 mL/min (by C-G formula based on SCr of 0.55 mg/dL). Liver Function Tests: Recent Labs  Lab 04/11/23 0320 04/12/23 0348 04/13/23 0332 04/16/23 0322 04/17/23 1007  AST 11* 13* 12* 16 15  ALT 9 9 10 12 13   ALKPHOS 58 61 64 58 57  BILITOT 0.3 0.3 <0.1* 0.2* 0.3  PROT 6.0* 6.9 6.9 6.7 6.2*  ALBUMIN 2.4* 2.9* 2.9* 2.8* 2.8*   No results for input(s): "LIPASE", "AMYLASE" in the last 168 hours. No results for input(s): "AMMONIA" in the last 168 hours. Coagulation Profile: No results for input(s): "INR", "PROTIME" in the last 168 hours. Cardiac Enzymes: No results for input(s): "CKTOTAL", "CKMB", "CKMBINDEX", "TROPONINI" in the last 168 hours. BNP (last 3 results) No results for input(s): "PROBNP" in the last 8760 hours. HbA1C: No results for input(s): "HGBA1C" in the last 72 hours. CBG: No results for input(s): "GLUCAP" in the last 168 hours. Lipid Profile: No results for input(s): "CHOL", "HDL", "LDLCALC", "TRIG", "CHOLHDL", "LDLDIRECT" in the last 72 hours. Thyroid Function Tests: No results for input(s): "TSH", "T4TOTAL", "FREET4", "T3FREE", "THYROIDAB" in the last 72 hours. Anemia Panel: No results for input(s): "VITAMINB12", "FOLATE", "FERRITIN", "TIBC", "IRON", "RETICCTPCT" in the last 72 hours. Sepsis Labs: No results for input(s): "PROCALCITON", "LATICACIDVEN" in the last 168 hours.  Recent Results (from  the past 240 hour(s))  Body fluid culture w Gram Stain     Status: None   Collection Time: 04/08/23  6:34 PM   Specimen: KNEE; Body Fluid  Result Value Ref Range Status   Specimen Description   Final    KNEE Performed at [facility], [address] [place], [place] [address]    Special Requests   Final    NONE Performed at [facility], [address] [place], [place] [address]    Gram Stain   Final    ABUNDANT WBC PRESENT, PREDOMINANTLY PMN RARE GRAM POSITIVE COCCI IN PAIRS IN CHAINS Gram Stain Report Called to,Read Back By and Verified With: RN . A. Normand Sloop [id number] @ 2236 FH    Culture   Final    FEW GROUP B STREP(S.AGALACTIAE)ISOLATED TESTING AGAINST S. AGALACTIAE NOT ROUTINELY PERFORMED DUE TO PREDICTABILITY OF AMP/PEN/VAN SUSCEPTIBILITY. Performed at [facility], [address] [place], [place] [address]    Report Status 04/10/2023 FINAL  Final  Culture, blood (Routine X 2) w Reflex to ID Panel     Status: None   Collection Time: 04/09/23  6:19 AM   Specimen: BLOOD RIGHT HAND  Result Value Ref Range Status   Specimen Description BLOOD RIGHT HAND  Final   Special Requests   Final    BOTTLES DRAWN AEROBIC ONLY Blood Culture results may not be optimal due to an inadequate volume of blood received in culture bottles   Culture   Final    NO GROWTH 5 DAYS Performed at [facility], [address] [place], [place] [address]    Report Status 04/14/2023 FINAL  Final  Culture, blood (Routine X 2) w Reflex to ID Panel     Status: None   Collection Time: 04/09/23  8:06 AM   Specimen: BLOOD  Result Value Ref Range Status   Specimen Description BLOOD SITE NOT SPECIFIED  Final   Special Requests   Final    BOTTLES DRAWN AEROBIC AND ANAEROBIC Blood Culture results may not be optimal due to an inadequate volume of blood received in culture bottles   Culture   Final    NO GROWTH 5 DAYS Performed at [facility], [address] [place], [place] [address]    Report Status 04/14/2023 FINAL  Final  Aerobic/Anaerobic Culture w Gram Stain (surgical/deep wound)     Status: None   Collection Time: 04/09/23  3:28 PM   Specimen: Abscess  Result Value Ref Range Status   Specimen Description  ABSCESS  Final   Special Requests right knee  Final   Gram Stain   Final    ABUNDANT WBC PRESENT, PREDOMINANTLY PMN NO ORGANISMS SEEN    Culture   Final    MODERATE GROUP B STREP(S.AGALACTIAE)ISOLATED TESTING AGAINST S. AGALACTIAE NOT ROUTINELY PERFORMED DUE TO PREDICTABILITY OF AMP/PEN/VAN SUSCEPTIBILITY. NO ANAEROBES ISOLATED Performed at [facility], [address] [place], [place] [address]    Report Status 04/14/2023 FINAL  Final  Surgical pcr screen     Status: None   Collection Time: 04/14/23 11:40 AM   Specimen: Nasal Mucosa; Nasal Swab  Result Value Ref Range Status   MRSA, PCR NEGATIVE NEGATIVE Final   Staphylococcus aureus NEGATIVE NEGATIVE Final    Comment: (NOTE) The Xpert SA Assay (FDA approved for NASAL specimens in patients 70 years of age and older), is one component of a comprehensive surveillance program. It is  not intended to diagnose infection nor to guide or monitor treatment. Performed at [facility], [address], [place], [place] [address]     Radiology Studies: [place] EKG SITE RITE  Result Date: 04/16/2023 If Site Rite image not attached, placement could not be confirmed due to current cardiac rhythm.   Scheduled Meds:  sodium chloride   Intravenous Once   sodium chloride   Intravenous Once   celecoxib  200 mg Oral BID   Chlorhexidine Gluconate Cloth  6 each Topical Daily   docusate sodium  100 mg Oral BID   ferrous sulfate  325 mg Oral Daily   pantoprazole  40 mg Oral Daily   predniSONE  10 mg Oral 4X daily taper   sodium chloride flush  3 mL Intravenous Q12H   Continuous Infusions:  sodium chloride     sodium chloride 40 mL/hr at 04/17/23 1542   cefTRIAXone (ROCEPHIN)  IV Stopped (04/17/23 0959)   methocarbamol (ROBAXIN) IV      LOS: 8 days   Marguerita Merles, DO [facility] Available via Epic secure chat 7am-7pm After these hours, please refer to coverage provider listed on [email or web address] 04/17/2023, 5:19 PM

## 2023-04-17 NOTE — Progress Notes (Signed)
Physical Therapy Treatment Patient Details Name: Sharon Faulkner MRN: [id number] DOB: 13-Aug-1975 Today's Date: 04/17/2023   History of Present Illness 47 yr old female who presented to ED with R knee and leg pain and swelling that has been present since 7/28. Prior ED visit 7/31 x-ray was clear of fracture, but showed effusion. Unable to draw any fluid then 8/28 R knee aspirated with small volume of purulent fluid attained. Pt now s/p 8/29 R knee arthroscopic I&D which revealed chronic infection and component of osteomyelitis. s/p I&D and TKA 04/14/23.  PMH: Asthma and PE not on anticoagulation.    PT Comments  POD # 3 General Comments: AxO x 3 OOB on BSC with NT attempting to wash up and have a BM.  Pt is wearing full pull ups due to mentration and brought her reacher from home.  "I have to do this myself".  Pt lives with and care for her Mother who is legally blind and an BKA. Assisted with posterior peri care then amb.  General transfer comment: pt able to self rise.  did not wear KI this session.  able to pullup her brief with most of her weight through L LE.  Balance was good during task.General Gait Details: pt was able to amb 12 feet x 2 with one seated rest break.  Pt reports knee pain as 12/10.  recliner following for safety. Unable to tolerate any TE's due to pain and fatigue after amb.  HgB 6.8 pt asymptomatic.  Positioned in recliner to comfort and applied ICE.     If plan is discharge home, recommend the following: A lot of help with walking and/or transfers;A lot of help with bathing/dressing/bathroom;Assistance with cooking/housework;Assist for transportation;Help with stairs or ramp for entrance   Can travel by private vehicle        Equipment Recommendations  Rolling walker (2 wheels)    Recommendations for Other Services       Precautions / Restrictions Precautions Precautions: Fall Precaution Comments: reviewed no pillow under knee Restrictions Weight Bearing Restrictions:  No RLE Weight Bearing: Weight bearing as tolerated Other Position/Activity Restrictions: did not use KI this session     Mobility  Bed Mobility               General bed mobility comments: OOB on BSC    Transfers Overall transfer level: Needs assistance Equipment used: Rolling walker (2 wheels) Transfers: Sit to/from Stand, Bed to chair/wheelchair/BSC Sit to Stand: Supervision           General transfer comment: pt able to self rise.  did not wear KI this session.  able to pullup her brief with most of her weight through L LE.  Balance was good during task.    Ambulation/Gait   Gait Distance (Feet): 24 Feet (12 feet x 2) Assistive device: Rolling walker (2 wheels) Gait Pattern/deviations: Step-to pattern Gait velocity: decreased     General Gait Details: pt was able to amb 12 feet x 2 with one seated rest break.  Pt reports knee pain as 12/10.  recliner following for safety.   Stairs Stairs:  (entry level)           Wheelchair Mobility     Tilt Bed    Modified Rankin (Stroke Patients Only)       Balance  Cognition Arousal: Alert Behavior During Therapy: WFL for tasks assessed/performed Overall Cognitive Status: Within Functional Limits for tasks assessed                                 General Comments: AxO x 3 OOB on BSC with NT attempting to wash up and have a BM.  Pt is wearing full pull ups due to mentration and brought her reacher from home.  "I have to do this myself".  Pt lives with and care for her Mother who is legally blind and an BKA.        Exercises      General Comments        Pertinent Vitals/Pain Pain Assessment Pain Assessment: 0-10 Pain Score: 10-Worst pain ever Pain Location: R knee Pain Descriptors / Indicators: Aching, Discomfort, Constant, Guarding, Operative site guarding Pain Intervention(s): Monitored during session, Premedicated  before session, Repositioned, Ice applied    Home Living                          Prior Function            PT Goals (current goals can now be found in the care plan section) Progress towards PT goals: Progressing toward goals    Frequency    7X/week      PT Plan      Co-evaluation              AM-PAC PT "6 Clicks" Mobility   Outcome Measure  Help needed turning from your back to your side while in a flat bed without using bedrails?: A Little Help needed moving from lying on your back to sitting on the side of a flat bed without using bedrails?: A Little Help needed moving to and from a bed to a chair (including a wheelchair)?: A Little Help needed standing up from a chair using your arms (e.g., wheelchair or bedside chair)?: A Little Help needed to walk in hospital room?: A Little Help needed climbing 3-5 steps with a railing? : Total 6 Click Score: 16    End of Session Equipment Utilized During Treatment: Gait belt Activity Tolerance: Patient limited by pain Patient left: with call bell/phone within reach Nurse Communication: Mobility status PT Visit Diagnosis: Unsteadiness on feet (R26.81);Other abnormalities of gait and mobility (R26.89);Muscle weakness (generalized) (M62.81);Difficulty in walking, not elsewhere classified (R26.2);Pain Pain - Right/Left: Right Pain - part of body: Knee     Time: 1410-1450 PT Time Calculation (min) (ACUTE ONLY): 40 min  Charges:    $Gait Training: 8-22 mins $Therapeutic Activity: 23-37 mins PT General Charges $$ ACUTE PT VISIT: 1 Visit                     {Dandra Velardi  PTA Acute  Rehabilitation Services Office M-F          [phone]

## 2023-04-18 LAB — COMPREHENSIVE METABOLIC PANEL
ALT: 15 U/L (ref 0–44)
AST: 12 U/L — ABNORMAL LOW (ref 15–41)
Albumin: 3 g/dL — ABNORMAL LOW (ref 3.5–5.0)
Alkaline Phosphatase: 63 U/L (ref 38–126)
Anion gap: 8 (ref 5–15)
BUN: 21 mg/dL — ABNORMAL HIGH (ref 6–20)
CO2: 26 mmol/L (ref 22–32)
Calcium: 8.6 mg/dL — ABNORMAL LOW (ref 8.9–10.3)
Chloride: 104 mmol/L (ref 98–111)
Creatinine, Ser: 0.59 mg/dL (ref 0.44–1.00)
GFR, Estimated: >60 mL/min (ref >60)
Glucose, Bld: 96 mg/dL (ref 70–99)
Potassium: 3.9 mmol/L (ref 3.5–5.1)
Sodium: 138 mmol/L (ref 135–145)
Total Bilirubin: 0.3 mg/dL (ref 0.3–1.2)
Total Protein: 6.7 g/dL (ref 6.5–8.1)

## 2023-04-18 LAB — CBC WITH DIFFERENTIAL/PLATELET
Abs Immature Granulocytes: 0.35 10*3/uL — ABNORMAL HIGH (ref 0.00–0.07)
Basophils Absolute: 0.1 10*3/uL (ref 0.0–0.1)
Basophils Relative: 1 %
Eosinophils Absolute: 0.2 10*3/uL (ref 0.0–0.5)
Eosinophils Relative: 2 %
HCT: 28 % — ABNORMAL LOW (ref 36.0–46.0)
Hemoglobin: 8.1 g/dL — ABNORMAL LOW (ref 12.0–15.0)
Immature Granulocytes: 3 %
Lymphocytes Relative: 20 %
Lymphs Abs: 2.4 10*3/uL (ref 0.7–4.0)
MCH: 21.9 pg — ABNORMAL LOW (ref 26.0–34.0)
MCHC: 28.9 g/dL — ABNORMAL LOW (ref 30.0–36.0)
MCV: 75.7 fL — ABNORMAL LOW (ref 80.0–100.0)
Monocytes Absolute: 0.8 10*3/uL (ref 0.1–1.0)
Monocytes Relative: 7 %
Neutro Abs: 8.2 10*3/uL — ABNORMAL HIGH (ref 1.7–7.7)
Neutrophils Relative %: 67 %
Platelets: 427 10*3/uL — ABNORMAL HIGH (ref 150–400)
RBC: 3.7 MIL/uL — ABNORMAL LOW (ref 3.87–5.11)
RDW: 24.8 % — ABNORMAL HIGH (ref 11.5–15.5)
WBC: 12 10*3/uL — ABNORMAL HIGH (ref 4.0–10.5)
nRBC: 0.5 % — ABNORMAL HIGH (ref 0.0–0.2)

## 2023-04-18 LAB — PHOSPHORUS: Phosphorus: 3.7 mg/dL (ref 2.5–4.6)

## 2023-04-18 LAB — MAGNESIUM: Magnesium: 2 mg/dL (ref 1.7–2.4)

## 2023-04-18 MED ORDER — PANTOPRAZOLE SODIUM 40 MG PO TBEC
40.0000 mg | DELAYED_RELEASE_TABLET | Freq: Two times a day (BID) | ORAL | Status: DC
  Administered 2023-04-18 – 2023-04-19 (×2): 40 mg via ORAL
  Filled 2023-04-18 (×2): qty 1

## 2023-04-18 MED ORDER — ENOXAPARIN SODIUM 40 MG/0.4ML IJ SOSY
40.0000 mg | PREFILLED_SYRINGE | INTRAMUSCULAR | Status: DC
  Administered 2023-04-18 – 2023-04-19 (×2): 40 mg via SUBCUTANEOUS
  Filled 2023-04-18 (×2): qty 0.4

## 2023-04-18 NOTE — TOC Progression Note (Signed)
Transition of Care [facility]) - Progression Note    Patient Details  Name: Cleota Donaway MRN: [id number] Date of Birth: Jul 27, 1976  Transition of Care [facility]) CM/SW Contact  Adrian Prows, RN Phone Number: 04/18/2023, 12:27 PM  Clinical Narrative:    Anticipated pt d/c today wit home abx; per Dr Marland Mcalpine likely d/c tomorrow; Elita Quick at [facility] notified; [facility] is following.   Expected Discharge Plan: Home w Home Health Services Barriers to Discharge: Continued Medical Work up, Inadequate or no insurance  Expected Discharge Plan and Services In-house Referral: Clinical Social Work Discharge Planning Services: Indigent Health Clinic, Medication Assistance, MATCH Program Post Acute Care Choice: Home Health Living arrangements for the past 2 months: Hotel/Motel                 DME Arranged: Education officer, community spoke with at DME Agency: Charity walker HH Arranged: RN, IV Antibiotics HH Agency: Surveyor, mining, Other - See comment ([facility] will provide [facility].) Date HH Agency Contacted: 04/15/23   Representative spoke with at [facility] Agency: Marchelle Folks)   Social Determinants of Health (SDOH) Interventions SDOH Screenings   Housing: Patient Declined (04/09/2023)  Tobacco Use: High Risk (04/14/2023)    Readmission Risk Interventions     No data to display

## 2023-04-18 NOTE — Plan of Care (Signed)

## 2023-04-18 NOTE — Plan of Care (Signed)

## 2023-04-18 NOTE — Progress Notes (Signed)
Physical Therapy Treatment Patient Details Name: Sharon Faulkner MRN: [id number] DOB: 06/23/1976 Today's Date: 04/18/2023   History of Present Illness 47 yr old female who presented to ED with R knee and leg pain and swelling that has been present since 7/28. Prior ED visit 7/31 x-ray was clear of fracture, but showed effusion. Unable to draw any fluid then 8/28 R knee aspirated with small volume of purulent fluid attained. Pt now s/p 8/29 R knee arthroscopic I&D which revealed chronic infection and component of osteomyelitis. s/p I&D and TKA 04/14/23.  PMH: Asthma and PE not on anticoagulation.    PT Comments  Pt agreeable to working with therapy. Encouraged pt to progress activity (walk to/from bathroom instead of using bsc). Pt prefers to perform mobility tasks in her own preferred manner. She tends to self limit activity. She reports pain and mobility are a little better than yesterday. After ambulating a short distance, she worked on knee flexion ROM a bit. Will continue to follow and progress activity as tolerated.     If plan is discharge home, recommend the following: A lot of help with walking and/or transfers;A lot of help with bathing/dressing/bathroom;Assistance with cooking/housework;Assist for transportation;Help with stairs or ramp for entrance   Can travel by private vehicle        Equipment Recommendations  Rolling walker (2 wheels)    Recommendations for Other Services       Precautions / Restrictions Precautions Precautions: Fall Precaution Comments: reviewed no pillow under knee with patient Restrictions Weight Bearing Restrictions: No RLE Weight Bearing: Weight bearing as tolerated     Mobility  Bed Mobility Overal bed mobility: Modified Independent Bed Mobility: Supine to Sit     Supine to sit: Modified independent (Device/Increase time), HOB elevated     General bed mobility comments: Pt used gait belt as leg lifter. She prefers to do things in her own manner.     Transfers Overall transfer level: Needs assistance Equipment used: Rolling walker (2 wheels) Transfers: Sit to/from Stand Sit to Stand: Supervision           General transfer comment: Supv for safety. Pt prefers to do things in her own preferred manner (leaves gait belt loop hooked around foot).    Ambulation/Gait Ambulation/Gait assistance: Supervision Gait Distance (Feet): 15 Feet (x2) Assistive device: Rolling walker (2 wheels) Gait Pattern/deviations: Step-to pattern, Antalgic, Decreased stance time - right       General Gait Details: Supv for safety. Pt walked to and from bathroom with RW and gait belt loop hooked around her foot (pt prefers to do things in her own preferred manner).   Stairs             Wheelchair Mobility     Tilt Bed    Modified Rankin (Stroke Patients Only)       Balance Overall balance assessment: Needs assistance         Standing balance support: During functional activity, Reliant on assistive device for balance, Bilateral upper extremity supported Standing balance-Leahy Scale: Fair                              Cognition Arousal: Alert Behavior During Therapy: WFL for tasks assessed/performed Overall Cognitive Status: Within Functional Limits for tasks assessed  General Comments: "I have to do this myself".  Pt lives with and care for her mother.        Exercises Total Joint Exercises Knee Flexion: AROM, 10 reps, Seated Goniometric ROM: ~10-50 degrees (seated)    General Comments        Pertinent Vitals/Pain Pain Assessment Pain Assessment: Faces Faces Pain Scale: Hurts whole lot Pain Location: R knee Pain Intervention(s): Limited activity within patient's tolerance, Monitored during session, Ice applied, Repositioned    Home Living                          Prior Function            PT Goals (current goals can now be found in the  care plan section) Progress towards PT goals: Progressing toward goals    Frequency    7X/week      PT Plan      Co-evaluation              AM-PAC PT "6 Clicks" Mobility   Outcome Measure  Help needed turning from your back to your side while in a flat bed without using bedrails?: None Help needed moving from lying on your back to sitting on the side of a flat bed without using bedrails?: A Little Help needed moving to and from a bed to a chair (including a wheelchair)?: A Little Help needed standing up from a chair using your arms (e.g., wheelchair or bedside chair)?: A Little Help needed to walk in hospital room?: A Little Help needed climbing 3-5 steps with a railing? : A Lot 6 Click Score: 18    End of Session Equipment Utilized During Treatment: Gait belt Activity Tolerance: Patient limited by pain Patient left: in chair;with call bell/phone within reach   PT Visit Diagnosis: Unsteadiness on feet (R26.81);Other abnormalities of gait and mobility (R26.89);Muscle weakness (generalized) (M62.81);Difficulty in walking, not elsewhere classified (R26.2);Pain Pain - Right/Left: Right Pain - part of body: Knee     Time: 1050-1058 PT Time Calculation (min) (ACUTE ONLY): 8 min  Charges:    $Gait Training: 8-22 mins PT General Charges $$ ACUTE PT VISIT: 1 Visit              Faye Ramsay, PT Acute Rehabilitation  Office: [phone]

## 2023-04-18 NOTE — Progress Notes (Signed)
Subjective: Patient reports pain as severe but improving. Still does not want to move knee much. Very drowsy this morning.   Tolerating diet.  Urinating.   No CP, SOB.  Hasn't mobilized OOB much with PT.   Objective:   VITALS:   Vitals:   04/17/23 1830 04/17/23 2111 04/18/23 0631 04/18/23 1000  BP: 136/86 125/84 (!) 161/99 (!) 143/87  Pulse: 78 75 64 73  Resp: 16 18 19 16   Temp: 98.9 F (37.2 C) 97.9 F (36.6 C) 98.4 F (36.9 C)   TempSrc: Oral     SpO2: 100% 96% 99% 96%  Weight:      Height:          Latest Ref Rng & Units 04/18/2023    5:52 AM 04/17/2023   10:07 AM 04/16/2023    7:46 PM  CBC  WBC 4.0 - 10.5 K/uL 12.0  11.3    Hemoglobin 12.0 - 15.0 g/dL 8.1  6.8  7.1   Hematocrit 36.0 - 46.0 % 28.0  23.8  24.5   Platelets 150 - 400 K/uL 427  403        Latest Ref Rng & Units 04/18/2023    5:52 AM 04/17/2023   10:07 AM 04/16/2023    3:22 AM  BMP  Glucose 70 - 99 mg/dL 96  086  578   BUN 6 - 20 mg/dL 21  17  12    Creatinine 0.44 - 1.00 mg/dL 4.69  6.29  5.28   Sodium 135 - 145 mmol/L 138  140  138   Potassium 3.5 - 5.1 mmol/L 3.9  3.6  4.5   Chloride 98 - 111 mmol/L 104  104  104   CO2 22 - 32 mmol/L 26  25  24    Calcium 8.9 - 10.3 mg/dL 8.6  8.5  9.1    Intake/Output      09/06 0701 09/07 0700 09/07 0701 09/08 0700   P.O. 1140 120   I.V. (mL/kg) 742.5 (5.4)    Blood 380    IV Piggyback 103.6    Total Intake(mL/kg) 2366.1 (17.2) 120 (0.9)   Urine (mL/kg/hr) 800 (0.2)    Stool     Total Output 800    Net +1566.1 +120        Urine Occurrence 2 x    Stool Occurrence 1 x       Physical Exam: General: NAD.  Laying in bed, calm, very drowsy this morning Resp: No increased wob Cardio: regular rate and rhythm ABD soft Neurologically intact MSK Neurovascularly intact Sensation intact distally Intact pulses distally Dorsiflexion/Plantar flexion intact Incision: dressing C/D/I   Assessment: 4 Days Post-Op  S/P Procedure(s) (LRB): INCISION AND  DRAINAGE RIGHT KNEE (Right) TOTAL KNEE ARTHROPLASTY (Right) by Dr. Jewel Baize. Murphy on 04/14/23  Principal Problem:   Septic arthritis of knee, right (HCC) Active Problems:   Asthma   Chronic anemia   Arthritis, septic, knee (HCC)   Group B streptococcal infection   Plan: Blood transfusion yesterday. Hgb 8.1 today.   Advance diet Up with therapy, encourage to push through the pain Incentive Spirometry Elevate and Apply ice  Weightbearing: WBAT RLE Insicional and dressing care: Dressings left intact until follow-up and Reinforce dressings as needed Orthopedic device(s):  CPM Showering: Keep dressing dry VTE prophylaxis: Lovenox 40mg  qd but hold until Hgb increases, SCDs, ambulation Pain control: PRN. Avoid over sedation.  Follow - up plan:  TBD Contact information:  After hours and holidays please  check [email or web address] for group call information for [facility]   Dispo:  TBD  based on PT evaluations, pain control, and ability to secure [facility] for IV ABX or not.   Vernetta Honey, [name] Office [phone] 04/18/2023, 10:15 AM

## 2023-04-18 NOTE — Progress Notes (Signed)
Physical Therapy Treatment Patient Details Name: Sharon Faulkner MRN: [id number] DOB: Sep 22, 1975 Today's Date: 04/18/2023   History of Present Illness 47 yr old female who presented to ED with R knee and leg pain and swelling that has been present since 7/28. Prior ED visit 7/31 x-ray was clear of fracture, but showed effusion. Unable to draw any fluid then 8/28 R knee aspirated with small volume of purulent fluid attained. Pt now s/p 8/29 R knee arthroscopic I&D which revealed chronic infection and component of osteomyelitis. s/p I&D and TKA 04/14/23.  PMH: Asthma and PE not on anticoagulation.    PT Comments  Progressing with mobility.     If plan is discharge home, recommend the following: Assistance with cooking/housework;Assist for transportation;Help with stairs or ramp for entrance;A little help with walking and/or transfers;A little help with bathing/dressing/bathroom   Can travel by private vehicle        Equipment Recommendations  Rolling walker (2 wheels)    Recommendations for Other Services       Precautions / Restrictions Precautions Precautions: Fall Precaution Comments: reviewed no pillow under knee Restrictions Weight Bearing Restrictions: No RLE Weight Bearing: Weight bearing as tolerated     Mobility  Bed Mobility Overal bed mobility: Modified Independent Bed Mobility: Supine to Sit     Supine to sit: Modified independent (Device/Increase time), HOB elevated     General bed mobility comments: oob in recliner    Transfers Overall transfer level: Needs assistance Equipment used: Rolling walker (2 wheels) Transfers: Sit to/from Stand Sit to Stand: Supervision           General transfer comment: Supv for safety.    Ambulation/Gait Ambulation/Gait assistance: Supervision Gait Distance (Feet): 75 Feet Assistive device: Rolling walker (2 wheels) Gait Pattern/deviations: Step-to pattern, Antalgic, Decreased stance time - right       General Gait  Details: Supv for safety. Slow gait speed. Pt denied dizziness but reports fatigue.   Stairs             Wheelchair Mobility     Tilt Bed    Modified Rankin (Stroke Patients Only)       Balance Overall balance assessment: Needs assistance         Standing balance support: During functional activity, Reliant on assistive device for balance, Bilateral upper extremity supported Standing balance-Leahy Scale: Fair                              Cognition Arousal: Alert Behavior During Therapy: WFL for tasks assessed/performed Overall Cognitive Status: Within Functional Limits for tasks assessed                                 General Comments: "I have to do this myself".  Pt lives with and care for her mother.        Exercises     General Comments        Pertinent Vitals/Pain Pain Assessment Pain Assessment: Faces Faces Pain Scale: Hurts even more Pain Location: R knee Pain Descriptors / Indicators: Aching, Discomfort, Constant, Guarding, Operative site guarding Pain Intervention(s): Limited activity within patient's tolerance, Monitored during session, Ice applied, Repositioned    Home Living                          Prior Function  PT Goals (current goals can now be found in the care plan section) Progress towards PT goals: Progressing toward goals    Frequency    7X/week      PT Plan      Co-evaluation              AM-PAC PT "6 Clicks" Mobility   Outcome Measure  Help needed turning from your back to your side while in a flat bed without using bedrails?: None Help needed moving from lying on your back to sitting on the side of a flat bed without using bedrails?: A Little Help needed moving to and from a bed to a chair (including a wheelchair)?: A Little Help needed standing up from a chair using your arms (e.g., wheelchair or bedside chair)?: A Little Help needed to walk in hospital  room?: A Little Help needed climbing 3-5 steps with a railing? : A Lot 6 Click Score: 18    End of Session Equipment Utilized During Treatment: Gait belt Activity Tolerance: Patient tolerated treatment well Patient left: in chair;with call bell/phone within reach;with family/visitor present   PT Visit Diagnosis: Unsteadiness on feet (R26.81);Other abnormalities of gait and mobility (R26.89);Muscle weakness (generalized) (M62.81);Difficulty in walking, not elsewhere classified (R26.2);Pain Pain - Right/Left: Right Pain - part of body: Knee     Time: 5784-6962 PT Time Calculation (min) (ACUTE ONLY): 26 min  Charges:    $Gait Training: 23-37 mins PT General Charges $$ ACUTE PT VISIT: 1 Visit                         Faye Ramsay, PT Acute Rehabilitation  Office: [phone]

## 2023-04-18 NOTE — Progress Notes (Signed)
PROGRESS NOTE    Sharon Faulkner  VHQ:[id number] DOB: March 18, 1976 DOA: 04/08/2023 PCP: Patient, No Pcp Per   Brief Narrative:  The patient is a 47 year old morbidly obese African-American female with a past medical history significant for but not limited to asthma as well as other comorbidities who presented with right knee swelling and pain.  Orthopedic surgery was consulted and she is status post arthrocentesis.  Synovial fluid culture showed strep agalactiae and so ID was consulted and was placed on penicillin G.  She was found to have extensive osteomyelitis and infected tissue in the right knee joint that lead to irreversible damage almost complete cartilage degeneration.  Orthopedic surgery is following closely and plan for two-stage total knee arthroplasty with antibiotic placement and she underwent incision and drainage and also had a total knee arthroplasty is postoperative day 4.    ID is recommending a prolonged course of antibiotics.  Pain is relatively still uncontrolled and will need further adjustments and is now improving fully but she still received some IV Dilaudid.  Will need to transition her off IVs to attempt to have pain control with oral so she can be discharged.  Hospitalization has been complicated by acute on chronic anemia however patient had refused blood transfusion initially but now is agreeable and is status post 2 units PRBCs. If hemoglobin stabilizes and pain is relatively well-controlled she can be discharged the next 24 to 48 hours.  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  No problem with ambulation at baseline.   -Underwent arthrocentesis in the emergency department.  Arthrocentesis showed streptococcal agalactiae.   -ID consulted.  No history of fever or chills. Currently on Penicillin G for antibiotic management however now infectious diseases are planning to change her to  IV ceftriaxone on 04/16/2023 to get coverage for home and will have home teaching administration with a 6-week course duration for antibiotics -WBC Trend and still remains slightly elevated: Recent Labs  Lab 04/13/23 0332 04/14/23 0253 04/15/23 0311 04/15/23 1803 04/16/23 0322 04/17/23 1007 04/18/23 0552  WBC 7.7 8.4 14.0* 11.7* 10.7* 11.3* 12.0*  -MRI showed large joint effusion with high-grade synovial thickening and synovial enhancement, high-grade marrow edema . -Found to have extensive osteomyelitis, infected tissue in the knee joint that has lead to irreversible damage and almost complete cartilage degeneration.   -Orthopedics closely following, did arthoscopy, planning for two-stage total knee arthroplasty with antibiotic placement. -Blood cultures have not shown any growth so far.  Continue pain management, supportive care. -Patient underwent a arthroscopy of her right knee on 04/14/2023 as well as incision and drainage of the right knee with total knee arthroplasty done by Dr. Margarita Rana. -Continues to have quite a lot of pain which is severe and has not mobilized yet. -Orthopedic surgery has adjusted her pain regimen and adjusted her hydromorphone to every 2 as needed and added Celebrex as well as a Medrol Dosepak.  Taper initiated.  Currently she is on acetaminophen at 1000 mg p.o. every 6 scheduled; IV hydromorphone 0.5 to 1 mg every 2 as needed for severe pain as well as p.o. oxycodone 5 to 10 mg every 4 as needed for moderate pain and 10 to 50 mg every 4 as needed severe pain -We will discontinue her Celecoxib 200 mg given her anemia -Patient was advised that today tomorrow will be the top of the day since her TKA since her nerve block will wear off;  the weightbearing status is weightbearing as tolerated right lower extremity and are recommending leaving the dressings intact until follow-up and reinforcing dressings as needed -Currently holding her Lovenox 40 mg daily until her  hemoglobin increases but recommended SCDs and ambulation -Patient is postoperative day 4 with incision and drainage implant for stability in the infectious diseases are recommending 6 weeks of IV penicillin versus cefazolin per awaiting to see with charity care and can be approved; PICC line to be placed and is pending  -Patient also received Tranexamic acid 1000 mg IV x 1 in the PACU -Patient having a CPM to be placed however was unable to be applied due to her pain level. -Orthopedic Surgery recommending advancing her diet and recommending upper therapy and encouraging her to present to the pain and continue with incentive spirometry as well as elevating the extremity and applying ice. -The transition of care team is arranging home health for the patient and the caseworker delivered a charity rolling walker to the patient's room; Pam with [facility] is working on antibiotics for discharge but the patient will need a allergy for the home health RN.  PT OT continue to work with the patient and she will need a rolling walker and home health and charity home health is being set up for the patient  History of Asthma -Currently not in exacerbation -Continue to Monitor respiratory status carefully  ABLA superimposed on Chronic Microcytic Anemia -Hgb/Hct Trend: Recent Labs  Lab 04/14/23 1703 04/15/23 0311 04/15/23 1803 04/16/23 0322 04/16/23 1946 04/17/23 1007 04/18/23 0552  HGB 7.5* 6.6* 6.6* 6.4* 7.1* 6.8* 8.1*  HCT 22.0* 23.6* 23.8* 23.1* 24.5* 23.8* 28.0*  MCV  --  70.2* 70.4* 71.7*  --  74.6* 75.7*  -Iron Panel done and showed Iron Level of 24, UIBC of 350, TIBC of 374, and Saturation Ratios of 6% -S/p 2 Units of pRBC's -Repeat CBC this Evening to assess stability -Continue to monitor for signs and symptoms of bleeding; patient is now on her period as well -Repeat CBC in the a.m.  Thrombocytosis, improved -Likely reactive in the setting of her septic knee -Platelet Count Trend: Recent  Labs  Lab 04/13/23 0332 04/14/23 0253 04/15/23 0311 04/15/23 1803 04/16/23 0322 04/17/23 1007 04/18/23 0552  PLT 538* 476* 541* 453* 404* 403* 427*  -Continue to monitor and repeat CBC in a.m.  Hypoalbuminemia -Patient's Albumin Trend: Recent Labs  Lab 04/10/23 0632 04/11/23 0320 04/12/23 0348 04/13/23 0332 04/16/23 0322 04/17/23 1007 04/18/23 0552  ALBUMIN 2.5* 2.4* 2.9* 2.9* 2.8* 2.8* 3.0*  -Continue to Monitor and Trend and repeat CMP in the AM  Morbid Obesity -Complicates overall prognosis and care -Estimated body mass index is 52.03 kg/m as calculated from the following:   Height as of this encounter: 5\' 4"  (1.626 m).   Weight as of this encounter: 137.5 kg.  -Weight Loss and Dietary Counseling given   DVT prophylaxis: enoxaparin (LOVENOX) injection 40 mg Start: 04/18/23 1400 SCDs Start: 04/14/23 1814 Place TED hose Start: 04/14/23 1814    Code Status: Full Code Family Communication: No family currently at bedside  Disposition Plan:  Level of care: Med-Surg Status is: Inpatient Remains inpatient appropriate because: Will monitor overnight anticipate discharging home in next 24 to 48 hours if hemoglobin has stabilized and pain is well-controlled on orals   Consultants:  Orthopedic Surgery Infectious Diseases  Procedures:  INCISION AND DRAINAGE RIGHT KNEE, TOTAL KNEE ARTHROPLASTY Dr. Margarita Rana on 04/14/2023   Antimicrobials:  Anti-infectives (From admission, onward)  Start     Dose/Rate Route Frequency Ordered Stop   04/16/23 1215  cefTRIAXone (ROCEPHIN) 2 g in sodium chloride 0.9 % 100 mL IVPB        2 g 200 mL/hr over 30 Minutes Intravenous Daily 04/16/23 1119     04/14/23 1357  ceFAZolin (ANCEF) 3-0.9 GM/100ML-% IVPB       Note to Pharmacy: Ramond Craver R: cabinet override      04/14/23 1357 04/15/23 0214   04/14/23 1245  gentamicin (GARAMYCIN) injection 800 mg  Status:  Discontinued        800 mg Intramuscular  Once 04/14/23 1236  04/15/23 1025   04/12/23 1200  penicillin G potassium 12 Million Units in dextrose 5 % 500 mL CONTINUOUS infusion  Status:  Discontinued        12 Million Units 41.7 mL/hr over 12 Hours Intravenous Every 12 hours 04/12/23 1105 04/16/23 1119   04/10/23 2000  penicillin G potassium 12 Million Units in dextrose 5 % 500 mL CONTINUOUS infusion  Status:  Discontinued        12 Million Units 41.7 mL/hr over 12 Hours Intravenous Every 12 hours 04/10/23 1850 04/12/23 1107   04/10/23 0600  vancomycin (VANCOCIN) IVPB 1000 mg/200 mL premix  Status:  Discontinued        1,000 mg 200 mL/hr over 60 Minutes Intravenous Every 12 hours 04/09/23 1434 04/10/23 1850   04/09/23 1800  vancomycin (VANCOREADY) IVPB 2000 mg/400 mL        2,000 mg 200 mL/hr over 120 Minutes Intravenous  Once 04/09/23 1434 04/09/23 2300   04/09/23 1130  ceFAZolin (ANCEF) IVPB 2g/100 mL premix        2 g 200 mL/hr over 30 Minutes Intravenous On call to O.R. 04/09/23 1044 04/09/23 1531   04/08/23 2330  ceFAZolin (ANCEF) IVPB 2g/100 mL premix        2 g 200 mL/hr over 30 Minutes Intravenous  Once 04/08/23 2327 04/09/23 0036       Subjective: Seen and examined at bedside and states that her "leg was cold".  Also was complaining of significant knee pain and states that she needed some IV meds to help for given the extreme pain.  No nausea or vomiting.  Happy that her blood count is slightly improved now.  No nausea or vomiting.  No other concerns or complaints this time.  Objective: Vitals:   04/18/23 1000 04/18/23 1049 04/18/23 1432 04/18/23 1435  BP: (!) 143/87 (!) 142/94  (!) 148/91  Pulse: 73 68 74 73  Resp: 16 18  20   Temp:  98.1 F (36.7 C)  98.3 F (36.8 C)  TempSrc:  Oral  Oral  SpO2: 96% 100% 99% 99%  Weight:      Height:        Intake/Output Summary (Last 24 hours) at 04/18/2023 1559 Last data filed at 04/18/2023 1305 Gross per 24 hour  Intake 2387.24 ml  Output --  Net 2387.24 ml   Filed Weights   04/15/23 0500  04/16/23 0500 04/17/23 0500  Weight: 126.2 kg 134.7 kg (!) 137.5 kg   Examination: Physical Exam:  Constitutional: WN/WD obese African-American female who appears calm but slightly uncomfortable.  Respiratory: Diminished to auscultation bilaterally, no wheezing, rales, rhonchi or crackles. Normal respiratory effort and patient is not tachypenic. No accessory muscle use.  Unlabored breathing Cardiovascular: RRR, no murmurs / rubs / gallops. S1 and S2 auscultated. No extremity edema.  Abdomen: Soft, non-tender, distended secondary to body  habitus. Bowel sounds positive.  GU: Deferred. Musculoskeletal: No clubbing / cyanosis of digits/nails. No joint deformity upper and lower extremities.  Skin: No rashes, lesions, ulcers on limited skin evaluation and right knee has an incision which is covered. No induration; Warm and dry.  Neurologic: CN 2-12 grossly intact with no focal deficits. Romberg sign and cerebellar reflexes not assessed.  Psychiatric: Normal judgment and insight. Alert and oriented x 3. Normal mood and appropriate affect.   Data Reviewed: I have personally reviewed following labs and imaging studies  CBC: Recent Labs  Lab 04/15/23 0311 04/15/23 1803 04/16/23 0322 04/16/23 1946 04/17/23 1007 04/18/23 0552  WBC 14.0* 11.7* 10.7*  --  11.3* 12.0*  NEUTROABS  --  10.5* 8.9*  --  8.0* 8.2*  HGB 6.6* 6.6* 6.4* 7.1* 6.8* 8.1*  HCT 23.6* 23.8* 23.1* 24.5* 23.8* 28.0*  MCV 70.2* 70.4* 71.7*  --  74.6* 75.7*  PLT 541* 453* 404*  --  403* 427*   Basic Metabolic Panel: Recent Labs  Lab 04/12/23 0348 04/13/23 0332 04/14/23 1703 04/15/23 0311 04/16/23 0322 04/17/23 1007 04/18/23 0552  NA 139 137 138 135 138 140 138  K 3.9 4.3 4.2 4.0 4.5 3.6 3.9  CL 103 107  --  100 104 104 104  CO2 27 25  --  25 24 25 26   GLUCOSE 114* 104*  --  125* 143* 116* 96  BUN 13 16  --  9 12 17  21*  CREATININE 0.61 0.52  --  0.57 0.56 0.55 0.59  CALCIUM 8.7* 8.8*  --  8.9 9.1 8.5* 8.6*  MG 2.0   --   --   --  1.8 1.7 2.0  PHOS  --   --   --   --  3.4 3.6 3.7   GFR: Estimated Creatinine Clearance: 119.2 mL/min (by C-G formula based on SCr of 0.59 mg/dL). Liver Function Tests: Recent Labs  Lab 04/12/23 0348 04/13/23 0332 04/16/23 0322 04/17/23 1007 04/18/23 0552  AST 13* 12* 16 15 12*  ALT 9 10 12 13 15   ALKPHOS 61 64 58 57 63  BILITOT 0.3 <0.1* 0.2* 0.3 0.3  PROT 6.9 6.9 6.7 6.2* 6.7  ALBUMIN 2.9* 2.9* 2.8* 2.8* 3.0*   No results for input(s): "LIPASE", "AMYLASE" in the last 168 hours. No results for input(s): "AMMONIA" in the last 168 hours. Coagulation Profile: No results for input(s): "INR", "PROTIME" in the last 168 hours. Cardiac Enzymes: No results for input(s): "CKTOTAL", "CKMB", "CKMBINDEX", "TROPONINI" in the last 168 hours. BNP (last 3 results) No results for input(s): "PROBNP" in the last 8760 hours. HbA1C: No results for input(s): "HGBA1C" in the last 72 hours. CBG: No results for input(s): "GLUCAP" in the last 168 hours. Lipid Profile: No results for input(s): "CHOL", "HDL", "LDLCALC", "TRIG", "CHOLHDL", "LDLDIRECT" in the last 72 hours. Thyroid Function Tests: No results for input(s): "TSH", "T4TOTAL", "FREET4", "T3FREE", "THYROIDAB" in the last 72 hours. Anemia Panel: No results for input(s): "VITAMINB12", "FOLATE", "FERRITIN", "TIBC", "IRON", "RETICCTPCT" in the last 72 hours. Sepsis Labs: No results for input(s): "PROCALCITON", "LATICACIDVEN" in the last 168 hours.  Recent Results (from the past 240 hour(s))  Body fluid culture w Gram Stain     Status: None   Collection Time: 04/08/23  6:34 PM   Specimen: KNEE; Body Fluid  Result Value Ref Range Status   Specimen Description   Final    KNEE Performed at [facility], [address] [place], [place] [address]  Special Requests   Final    NONE Performed at [facility], [address] [place], [place] [address]    Gram Stain   Final    ABUNDANT WBC PRESENT,  PREDOMINANTLY PMN RARE GRAM POSITIVE COCCI IN PAIRS IN CHAINS Gram Stain Report Called to,Read Back By and Verified With: RN . A. Normand Sloop [id number] @ 2236 FH    Culture   Final    FEW GROUP B STREP(S.AGALACTIAE)ISOLATED TESTING AGAINST S. AGALACTIAE NOT ROUTINELY PERFORMED DUE TO PREDICTABILITY OF AMP/PEN/VAN SUSCEPTIBILITY. Performed at [facility], [address] [place], [place] [address]    Report Status 04/10/2023 FINAL  Final  Culture, blood (Routine X 2) w Reflex to ID Panel     Status: None   Collection Time: 04/09/23  6:19 AM   Specimen: BLOOD RIGHT HAND  Result Value Ref Range Status   Specimen Description BLOOD RIGHT HAND  Final   Special Requests   Final    BOTTLES DRAWN AEROBIC ONLY Blood Culture results may not be optimal due to an inadequate volume of blood received in culture bottles   Culture   Final    NO GROWTH 5 DAYS Performed at [facility], [address], [place], [place] [address]    Report Status 04/14/2023 FINAL  Final  Culture, blood (Routine X 2) w Reflex to ID Panel     Status: None   Collection Time: 04/09/23  8:06 AM   Specimen: BLOOD  Result Value Ref Range Status   Specimen Description BLOOD SITE NOT SPECIFIED  Final   Special Requests   Final    BOTTLES DRAWN AEROBIC AND ANAEROBIC Blood Culture results may not be optimal due to an inadequate volume of blood received in culture bottles   Culture   Final    NO GROWTH 5 DAYS Performed at [facility], [address] [place], [place] [address]    Report Status 04/14/2023 FINAL  Final  Aerobic/Anaerobic Culture w Gram Stain (surgical/deep wound)     Status: None   Collection Time: 04/09/23  3:28 PM   Specimen: Abscess  Result Value Ref Range Status   Specimen Description ABSCESS  Final   Special Requests right knee  Final   Gram Stain   Final    ABUNDANT WBC PRESENT, PREDOMINANTLY PMN NO ORGANISMS SEEN    Culture   Final    MODERATE GROUP B  STREP(S.AGALACTIAE)ISOLATED TESTING AGAINST S. AGALACTIAE NOT ROUTINELY PERFORMED DUE TO PREDICTABILITY OF AMP/PEN/VAN SUSCEPTIBILITY. NO ANAEROBES ISOLATED Performed at [facility], [address], [place], [place] [address]    Report Status 04/14/2023 FINAL  Final  Surgical pcr screen     Status: None   Collection Time: 04/14/23 11:40 AM   Specimen: Nasal Mucosa; Nasal Swab  Result Value Ref Range Status   MRSA, PCR NEGATIVE NEGATIVE Final   Staphylococcus aureus NEGATIVE NEGATIVE Final    Comment: (NOTE) The Xpert SA Assay (FDA approved for NASAL specimens in patients 69 years of age and older), is one component of a comprehensive surveillance program. It is not intended to diagnose infection nor to guide or monitor treatment. Performed at [facility], [address], [place], [place] [address]     Radiology Studies: No results found.  Scheduled Meds:  sodium chloride   Intravenous Once   sodium chloride   Intravenous Once   Chlorhexidine Gluconate Cloth  6 each Topical Daily   docusate sodium  100 mg Oral BID   enoxaparin (LOVENOX) injection  40 mg Subcutaneous Q24H   ferrous sulfate  325 mg Oral Daily   pantoprazole  40 mg Oral BID   predniSONE  10 mg Oral 4X daily taper   sodium chloride flush  3 mL Intravenous Q12H   Continuous Infusions:  sodium chloride     sodium chloride 40 mL/hr at 04/18/23 1305   cefTRIAXone (ROCEPHIN)  IV Stopped (04/18/23 1026)   methocarbamol (ROBAXIN) IV      LOS: 9 days   Marguerita Merles, DO [facility] Available via Epic secure chat 7am-7pm After these hours, please refer to coverage provider listed on [email or web address] 04/18/2023, 3:59 PM

## 2023-04-19 DIAGNOSIS — M86159 Other acute osteomyelitis, unspecified femur: Secondary | ICD-10-CM

## 2023-04-19 LAB — CBC WITH DIFFERENTIAL/PLATELET
Abs Immature Granulocytes: 0.13 10*3/uL — ABNORMAL HIGH (ref 0.00–0.07)
Basophils Absolute: 0 10*3/uL (ref 0.0–0.1)
Basophils Relative: 0 %
Eosinophils Absolute: 0.3 10*3/uL (ref 0.0–0.5)
Eosinophils Relative: 3 %
HCT: 25.5 % — ABNORMAL LOW (ref 36.0–46.0)
Hemoglobin: 7.2 g/dL — ABNORMAL LOW (ref 12.0–15.0)
Immature Granulocytes: 1 %
Lymphocytes Relative: 15 %
Lymphs Abs: 1.5 10*3/uL (ref 0.7–4.0)
MCH: 21.5 pg — ABNORMAL LOW (ref 26.0–34.0)
MCHC: 28.2 g/dL — ABNORMAL LOW (ref 30.0–36.0)
MCV: 76.1 fL — ABNORMAL LOW (ref 80.0–100.0)
Monocytes Absolute: 0.7 10*3/uL (ref 0.1–1.0)
Monocytes Relative: 7 %
Neutro Abs: 7.4 10*3/uL (ref 1.7–7.7)
Neutrophils Relative %: 74 %
Platelets: 372 10*3/uL (ref 150–400)
RBC: 3.35 MIL/uL — ABNORMAL LOW (ref 3.87–5.11)
RDW: 25 % — ABNORMAL HIGH (ref 11.5–15.5)
WBC: 10 10*3/uL (ref 4.0–10.5)
nRBC: 0.4 % — ABNORMAL HIGH (ref 0.0–0.2)

## 2023-04-19 LAB — COMPREHENSIVE METABOLIC PANEL
ALT: 13 U/L (ref 0–44)
AST: 13 U/L — ABNORMAL LOW (ref 15–41)
Albumin: 2.8 g/dL — ABNORMAL LOW (ref 3.5–5.0)
Alkaline Phosphatase: 57 U/L (ref 38–126)
Anion gap: 8 (ref 5–15)
BUN: 20 mg/dL (ref 6–20)
CO2: 26 mmol/L (ref 22–32)
Calcium: 8.4 mg/dL — ABNORMAL LOW (ref 8.9–10.3)
Chloride: 104 mmol/L (ref 98–111)
Creatinine, Ser: 0.58 mg/dL (ref 0.44–1.00)
GFR, Estimated: >60 mL/min (ref >60)
Glucose, Bld: 104 mg/dL — ABNORMAL HIGH (ref 70–99)
Potassium: 4.3 mmol/L (ref 3.5–5.1)
Sodium: 138 mmol/L (ref 135–145)
Total Bilirubin: 0.2 mg/dL — ABNORMAL LOW (ref 0.3–1.2)
Total Protein: 6.3 g/dL — ABNORMAL LOW (ref 6.5–8.1)

## 2023-04-19 LAB — MAGNESIUM: Magnesium: 1.9 mg/dL (ref 1.7–2.4)

## 2023-04-19 LAB — PHOSPHORUS: Phosphorus: 3.7 mg/dL (ref 2.5–4.6)

## 2023-04-19 MED ORDER — SODIUM CHLORIDE 0.9 % IV SOLN
250.0000 mg | Freq: Once | INTRAVENOUS | Status: AC
  Administered 2023-04-19: 250 mg via INTRAVENOUS
  Filled 2023-04-19: qty 20

## 2023-04-19 MED ORDER — METHOCARBAMOL 500 MG PO TABS: 500.0000 mg | ORAL_TABLET | Freq: Four times a day (QID) | ORAL | 0 refills | Status: DC | PRN

## 2023-04-19 MED ORDER — HEPARIN SOD (PORK) LOCK FLUSH 100 UNIT/ML IV SOLN
250.0000 [IU] | INTRAVENOUS | Status: AC | PRN
  Administered 2023-04-19: 250 [IU]

## 2023-04-19 MED ORDER — ALBUTEROL SULFATE HFA 108 (90 BASE) MCG/ACT IN AERS: 2.0000 | INHALATION_SPRAY | Freq: Four times a day (QID) | RESPIRATORY_TRACT | 3 refills | Status: DC | PRN

## 2023-04-19 MED ORDER — CEFTRIAXONE IV (FOR PTA / DISCHARGE USE ONLY): 2.0000 g | INTRAVENOUS | 0 refills | Status: AC

## 2023-04-19 MED ORDER — ASPIRIN 81 MG PO TBEC: 81.0000 mg | DELAYED_RELEASE_TABLET | Freq: Two times a day (BID) | ORAL | 0 refills | Status: DC

## 2023-04-19 MED ORDER — POLYETHYLENE GLYCOL 3350 17 G PO PACK: 17.0000 g | PACK | Freq: Every day | ORAL | 0 refills | Status: DC | PRN

## 2023-04-19 MED ORDER — OXYCODONE HCL 5 MG PO TABS: 10.0000 mg | ORAL_TABLET | Freq: Four times a day (QID) | ORAL | 0 refills | Status: AC | PRN

## 2023-04-19 MED ORDER — ACETAMINOPHEN 500 MG PO TABS: 1000.0000 mg | ORAL_TABLET | Freq: Three times a day (TID) | ORAL | Status: AC

## 2023-04-19 MED ORDER — NAPROXEN SODIUM 220 MG PO TABS: 220.0000 mg | ORAL_TABLET | Freq: Two times a day (BID) | ORAL | Status: DC

## 2023-04-19 MED ORDER — DOCUSATE SODIUM 100 MG PO CAPS: 100.0000 mg | ORAL_CAPSULE | Freq: Two times a day (BID) | ORAL | Status: DC

## 2023-04-19 MED ORDER — FERROUS SULFATE 325 (65 FE) MG PO TABS: 325.0000 mg | ORAL_TABLET | ORAL | 3 refills | Status: DC

## 2023-04-19 NOTE — TOC Transition Note (Addendum)
Transition of Care [facility]) - CM/SW Discharge Note   Patient Details  Name: Sharon Faulkner MRN: [id number] Date of Birth: [birth year]  Transition of Care [facility]) CM/SW Contact:  Darleene Cleaver, LCSW Phone Number: 04/19/2023, 10:01 AM   Clinical Narrative:     CSW spoke with Pam at [facility], the home infusion team.  Per Pam home IV antibiotics are mixed and ready to be delivered once she is ready for discharge.  Home health nursing has been set up with [facility] and will start either Monday or Tuesday.  HH PT will be provided by [facility] under charity.  CSW updated bedside nurse and attending physician.   Final next level of care: Home w Home Health Services Barriers to Discharge: Barriers Resolved   Patient Goals and CMS Choice CMS Medicare.gov Compare Post Acute Care list provided to:: Patient Choice offered to / list presented to : Patient  Discharge Placement  Home with home IV antibiotics and home health nursing through [facility].                    Patient and family notified of of transfer: 04/19/23  Discharge Plan and Services Additional resources added to the After Visit Summary for   In-house Referral: Clinical Social Work Discharge Planning Services: Indigent Health Clinic, Medication Assistance, MATCH Program Post Acute Care Choice: Home Health          DME Arranged: IV pump/equipment DME Agency: Other - Comment [facility]) Date DME Agency Contacted: 04/19/23 Time DME Agency Contacted: 1000 Representative spoke with at DME Agency: Pam HH Arranged: RN HH Agency: [facility] Date HH Agency Contacted: 04/19/23 Time HH Agency Contacted: 1001 Representative spoke with at [facility] Agency: Pam  Social Determinants of Health (SDOH) Interventions SDOH Screenings   Housing: Patient Declined (04/09/2023)  Tobacco Use: High Risk (04/14/2023)     Readmission Risk Interventions     No data to display

## 2023-04-19 NOTE — Progress Notes (Signed)
Subjective: Patient reports pain as moderate to severe. Tolerating diet.  Urinating.   No CP, SOB.  Still not mobilizing as much as she should be at this point but trying to improve.  Received 2 blood transfusions. Hgb 7.2 today.  Objective:   VITALS:   Vitals:   04/18/23 1435 04/19/23 0500 04/19/23 0521 04/19/23 1256  BP: (!) 148/91  (!) 150/96 (!) 145/82  Pulse: 73  60 81  Resp: 20  17 18   Temp: 98.3 F (36.8 C)  98.1 F (36.7 C)   TempSrc: Oral     SpO2: 99%  100% 95%  Weight:  126 kg    Height:          Latest Ref Rng & Units 04/19/2023    3:05 AM 04/18/2023    5:52 AM 04/17/2023   10:07 AM  CBC  WBC 4.0 - 10.5 K/uL 10.0  12.0  11.3   Hemoglobin 12.0 - 15.0 g/dL 7.2  8.1  6.8   Hematocrit 36.0 - 46.0 % 25.5  28.0  23.8   Platelets 150 - 400 K/uL 372  427  403       Latest Ref Rng & Units 04/19/2023    3:05 AM 04/18/2023    5:52 AM 04/17/2023   10:07 AM  BMP  Glucose 70 - 99 mg/dL 161  96  096   BUN 6 - 20 mg/dL 20  21  17    Creatinine 0.44 - 1.00 mg/dL 0.45  4.09  8.11   Sodium 135 - 145 mmol/L 138  138  140   Potassium 3.5 - 5.1 mmol/L 4.3  3.9  3.6   Chloride 98 - 111 mmol/L 104  104  104   CO2 22 - 32 mmol/L 26  26  25    Calcium 8.9 - 10.3 mg/dL 8.4  8.6  8.5    Intake/Output      09/07 0701 09/08 0700 09/08 0701 09/09 0700   P.O. 720    I.V. (mL/kg) 1297.3 (10.3)    Blood     IV Piggyback 100    Total Intake(mL/kg) 2117.3 (16.8)    Urine (mL/kg/hr)     Total Output     Net +2117.3         Urine Occurrence 4 x       Physical Exam: General: NAD. Sitting up in bed, calm, just came from bathroom Resp: No increased wob Cardio: regular rate and rhythm ABD soft Neurologically intact MSK Neurovascularly intact Sensation intact distally Intact pulses distally Dorsiflexion/Plantar flexion intact Incision: dressing C/D/I   Assessment: 5 Days Post-Op  S/P Procedure(s) (LRB): INCISION AND DRAINAGE RIGHT KNEE (Right) TOTAL KNEE ARTHROPLASTY  (Right) by Dr. Jewel Baize. Eulah Pont on 04/14/23  Principal Problem:   Septic arthritis of knee, 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 (HCC) Active Problems:   Asthma   Acute on chronic anemia   Concern for acute osteomyelitis of femoal condyle and tibial plateau (HCC)   Obesity, Class III, BMI 40-49.9 (morbid obesity) (HCC)   Plan: Advance diet Up with therapy, encourage to push through the pain Incentive Spirometry Elevate and Apply ice  Weightbearing: WBAT RLE Insicional and dressing care: Dressings left intact until follow-up and Reinforce dressings as needed Orthopedic device(s):  CPM Showering: Keep dressing dry VTE prophylaxis: Lovenox 40mg  qd, can d/c home on ASA 81mg  bid x 30 days, SCDs, ambulation Pain control: PRN Follow - up plan:  in our office in 1 week Contact information:  [name], [name]  Dispo: Home likely today with [facility] and IV ABX set up. Will also need HHPT if unable to go to OPPT.      Jenne Pane, [name] Office [phone] 04/19/2023, 2:06 PM

## 2023-04-19 NOTE — Discharge Summary (Signed)
Physician Discharge Summary   Patient: Sharon Faulkner MRN: [id number] DOB: [birth year]  Admit date:     04/08/2023  Discharge date: 04/19/23  Discharge Physician: Alberteen Sam   PCP: Hoy Register MD    Recommendations at discharge:  Follow up with Orthopedics Dr. Eulah Pont in 1 week for septic arthritis and TKA Follow up with Infectious Disease Dr. Drue Second in 4-5 weeks for septic arthritis on prolonged home IV antibiotics Follow up with new PCP Dr. Alvis Lemmings in 2 weeks for septic arthritis, anemia Dr. Alvis Lemmings: Please check CBC in 2 weeks     Discharge Diagnoses: Principal Problem:   Septic arthritis of knee, 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 (HCC) Active Problems:   Concern for acute osteomyelitis of femoal condyle and tibial plateau (HCC)   Asthma   Acute on chronic anemia   Obesity, Class III, BMI 40-49.9 (morbid obesity) [facility])      Hospital Course: Sharon Faulkner is a 47 y.o. F with MO, asthma, untreated HTN and hx provoked PE no longer on [facility] who presented with right knee swelling and pain.  Found to have GBS septic arthritis with osteomyelitis.  Now s/p right TKA by Dr. Eulah Pont.           Septic arthritis Patient was admitted and underwent arthrocentesis growing group B strep.  ID and orthopedics were consulted, she was started on IV antibiotics.  MRI of the right knee showed extensive synovial thickening and enhancement, as well as possible osteomyelitis of the distal femur and proximal tibia.  Orthopedics took the patient to the OR for irrigation and debridement of the right knee as well as total knee arthroplasty and antibiotic bead placement  On the day of discharge, the patient was eager for discharge, work with physical therapy, and was mobilizing appropriately.  She was instructed on analgesic safety and her Home Health and infusion services were confirmed.  Discharged with ID and Ortho follow up.    Anemia The patient has chronic iron deficiency  anemia, presumably due to menstrual bleeding.  While here her hemoglobin drifted as low as 6.4.  She initially refused transfusion, and then later accepted at.    Her hemoglobin was around 7.2 at the time of discharge and she reported she was asymptomatic.  Given 2 doses of Ferrlecit 250 mg and discharged on oral iron.  Plan for close follow up with PCP in 2 weeks.         The [facility] Controlled Substances Registry was reviewed for this patient prior to discharge.  Consultants: Orthopedics Infectious disease  Procedures performed:  Arthroscopy of the right knee Incision and drainage right knee Total knee arthroplasty  Disposition: Home health Diet recommendation:  Regular diet  DISCHARGE MEDICATION: Allergies as of 04/19/2023       Reactions   Shellfish Allergy Anaphylaxis, Itching   Cocos Nucifera Hives   Codeine Itching   Mango Flavor Itching        Medication List     STOP taking these medications    diphenhydramine-acetaminophen 25-500 MG Tabs tablet Commonly known as: TYLENOL PM   oxyCODONE-acetaminophen 5-325 MG tablet Commonly known as: PERCOCET/ROXICET       TAKE these medications    acetaminophen 500 MG tablet Commonly known as: TYLENOL Take 2 tablets (1,000 mg total) by mouth in the morning, at noon, and at bedtime.   albuterol 108 (90 Base) MCG/ACT inhaler Commonly known as: VENTOLIN HFA Inhale 2 puffs into the lungs every 6 (six) hours as needed  for wheezing or shortness of breath.   aspirin EC 81 MG tablet Take 1 tablet (81 mg total) by mouth 2 (two) times daily. To prevent blood clots for 30 days after surgery.   cefTRIAXone IVPB Commonly known as: ROCEPHIN Inject 2 g into the vein daily. Indication:  GBS R-septic knee/osteo First Dose: Yes Last Day of Therapy:  05/26/23 Labs - Once weekly:  CBC/D and BMP, Labs - Once weekly: ESR and CRP Method of administration: IV Push Method of administration may be changed at the  discretion of home infusion pharmacist based upon assessment of the patient and/or caregiver's ability to self-administer the medication ordered.   docusate sodium 100 MG capsule Commonly known as: COLACE Take 1 capsule (100 mg total) by mouth 2 (two) times daily.   ferrous sulfate 325 (65 FE) MG tablet Take 1 tablet (325 mg total) by mouth every other day. What changed: when to take this   methocarbamol 500 MG tablet Commonly known as: ROBAXIN Take 1 tablet (500 mg total) by mouth every 6 (six) hours as needed for muscle spasms.   naproxen sodium 220 MG tablet Commonly known as: ALEVE Take 1 tablet (220 mg total) by mouth 2 (two) times daily with a meal. What changed:  when to take this reasons to take this   oxyCODONE 5 MG immediate release tablet Commonly known as: Oxy IR/ROXICODONE Take 2-3 tablets (10-15 mg total) by mouth every 6 (six) hours as needed for up to 7 days for severe pain.   polyethylene glycol 17 g packet Commonly known as: MIRALAX / GLYCOLAX Take 17 g by mouth daily as needed for mild constipation.               Durable Medical Equipment  (From admission, onward)           Start     Ordered   04/15/23 1552  For home use only DME Walker rolling  Once       Question Answer Comment  Walker: With 5 Inch Wheels   Patient needs a walker to treat with the following condition Knee pain      04/15/23 1552              Discharge Care Instructions  (From admission, onward)           Start     Ordered   04/19/23 0000  Change dressing on IV access line weekly and PRN  (Home infusion instructions - [facility] )        04/19/23 1354            Follow-up Information     [facility] Follow up.   Why: [facility] will provide the IV antibiotics in the home after discharge.        [facility] Follow up.   Why: [facility] will provide weekly nursing for PICC line maintenance and weekly labs.        [facility] Follow up on 05/07/2023.   Why: Your hospital follow up is scheduled for Thursday May 07, 2023 at 9:30am. You will see Dr. Alvis Lemmings. Please arrive at least 20 minutes before your scheduled appointment. Contact information: [address] [place] [address] [phone]        [facility] Follow up.   Why: Iantha Fallen will provide charity PT after discharge. Contact information: [address] [place] [address] [phone]  Judyann Munson, MD. Schedule an appointment as soon as possible for a visit in 1 month(s).   Specialty: Infectious Diseases Contact information: [address] [place] [address] [phone]         Sheral Apley, MD. Schedule an appointment as soon as possible for a visit in 1 week(s).   Specialty: Orthopedic Surgery Contact information: [address] [place] [address] [phone]                 Discharge Instructions     [facility] pharmacist to adjust dose for Vancomycin, Aminoglycosides and other anti-infective therapies as requested by physician.   Complete by: As directed    [facility] to provide Cath Flo 2mg    Complete by: As directed    Administer for PICC line occlusion and as ordered by physician for other access device issues.   Anaphylaxis Kit: Provided to treat any anaphylactic reaction to the medication being provided to the patient if First Dose or when requested by physician   Complete by: As directed    Epinephrine 1mg /ml vial / amp: Administer 0.3mg  (0.88ml) subcutaneously once for moderate to severe anaphylaxis, nurse to call physician and pharmacy when reaction occurs and call 911 if needed for immediate care   Diphenhydramine 50mg /ml IV vial: Administer 25-50mg  IV/IM PRN for first dose reaction, rash, itching, mild reaction, nurse to call physician and pharmacy  when reaction occurs   Sodium Chloride 0.9% NS IV: Administer if needed for hypovolemic blood pressure drop or as ordered by physician after call to physician with anaphylactic reaction   Change dressing on IV access line weekly and PRN   Complete by: As directed    Discharge instructions   Complete by: As directed    **IMPORTANT DISCHARGE INSTRUCTIONS**   From Dr. Maryfrances Bunnell: You were admitted for septic arthritis This means there was an infection IN the joint There may also have been some infection in the bone, but this was removed we believe when Dr. Eulah Pont did the joint replacement  You should take antibiotics for 6 weeks The antibiotic is ceftriaxone/Rocephin and you take it once daily  For pain: Take acetaminophen/Tylenol 1000 mg three times daily  In addition, take naproxen/Aleve 220 mg twice daily (this is an NSAID, like ibuprofen)  Then take oxycodone 10 or 15 mg (2 or 3 tabs) up to four times daily In the last 48 hours, you were taking 3 tabs 3-4 times daily here Do not take more than this, do not drive after taking, and keep in a secure place, as this is a dangerous medicine  Acetaminophen (which is Tylenol), Aleve and oxycodone all work differently, so you can take them together  Take Tylenol and Aleve to minimize how much oxycodone you have to take  Go see Dr. Eulah Pont in 2 weeks Call his office (see below) for an appointment  Also, call the Infectious Disease doctor, Dr. Drue Second (see below in the To Do section) for an appointnment close to the end of your treatment on Oct 15  Go to your new primary care appointment at the date and time scheduled  IMPORTANT: Take an iron supplement every other day Ferrous sulfate 325 mg is the iron supplement And it works better if taken every other day Have your new primary care doctor check your blood levels in 1-2 weeks   Flush IV access with Sodium Chloride 0.9% and Heparin 10 units/ml or 100 units/ml  Complete by: As  directed    Home infusion instructions - [facility]   Complete by: As directed    Instructions: Flush IV access with Sodium Chloride 0.9% and Heparin 10units/ml or 100units/ml   Change dressing on IV access line: Weekly and PRN   Instructions Cath Flo 2mg : Administer for PICC Line occlusion and as ordered by physician for other access device   [facility] pharmacist to adjust dose for: Vancomycin, Aminoglycosides and other anti-infective therapies as requested by physician   Increase activity slowly   Complete by: As directed    Method of administration may be changed at the discretion of home infusion pharmacist based upon assessment of the patient and/or caregiver's ability to self-administer the medication ordered   Complete by: As directed        Discharge Exam: Filed Weights   04/16/23 0500 04/17/23 0500 04/19/23 0500  Weight: 134.7 kg (!) 137.5 kg 126 kg    General: Pt is alert, awake, not in acute distress Cardiovascular: RRR, nl S1-S2, no murmurs appreciated.   No LE edema.   Respiratory: Normal respiratory rate and rhythm.  CTAB without rales or wheezes. Abdominal: Abdomen soft and non-tender.  No distension or HSM.   MSK: The incision dressings look clean dry and intact without surrounding erythema, redness. Neuro/Psych: Strength symmetric in upper and lower extremities.  Judgment and insight appear normal.   Condition at discharge: fair  The results of significant diagnostics from this hospitalization (including imaging, microbiology, ancillary and laboratory) are listed below for reference.   Imaging Studies: [place] EKG SITE RITE  Result Date: 04/16/2023 If Site Rite image not attached, placement could not be confirmed due to current cardiac rhythm.  [place] EKG SITE RITE  Result Date: 04/15/2023 If Site Rite image not attached, placement could not be confirmed due to current cardiac rhythm.  DG Knee Right Port  Result Date: 04/14/2023 CLINICAL  DATA:  Status post total right knee arthroplasty. Osteomyelitis of right knee region. Septic arthritis of right knee. EXAM: PORTABLE RIGHT KNEE - 1-2 VIEW COMPARISON:  Right knee radiographs 04/08/2023, MRI right knee 04/09/2023 FINDINGS: Interval total right knee arthroplasty with metallic femoral component and lower density tibial component. Antibiotic beads are seen throughout the tibial tunnel common femoral tunnel, and medial and suprapatellar compartment. Moderate joint effusion. Mild subcutaneous and intra-articular air. No cortical erosion is seen. IMPRESSION: Interval total right knee arthroplasty, presumably for resection of the prior medial greater than lateral compartment articular surface septic arthritis/osteomyelitis, with antibiotic beads in place. Moderate joint effusion. Electronically Signed   By: Neita Garnet M.D.   On: 04/14/2023 19:25   MR KNEE RIGHT W WO CONTRAST  Result Date: 04/09/2023 CLINICAL DATA:  Septic arthritis suspected. Knee swelling for 1 month. In emergency department arthrocentesis was performed and fluid showed numerous white blood cells and Gram-positive cocci unchanged. Scheduled for operating room washout. No known injury. EXAM: MRI OF THE RIGHT KNEE WITHOUT AND WITH CONTRAST TECHNIQUE: Multiplanar, multisequence MR imaging of the right knee was performed both before and after administration of intravenous contrast. Patient unable to straighten leg and could not be plate and correcting anatomical position. CONTRAST:  10mL GADAVIST GADOBUTROL 1 MMOL/ML IV SOLN COMPARISON:  Right knee radiographs 04/08/2023 and 03/11/2023 FINDINGS: MENISCI Medial meniscus: There is high-grade attenuation of the medial meniscus with only a small portion of the predominantly peripheral third of the meniscal triangle remaining within the body, anterior horn, and posterior horn. Lateral meniscus: Complex tearing throughout  the majority of the posterior horn and body of the lateral meniscus  predominantly involving the middle and central thirds of the meniscal triangle. The anterior horn of the lateral meniscus is not well visualized. LIGAMENTS Cruciates: The ACL and PCL are intact. Collaterals: Joint effusion and overlying medial knee soft tissue swelling cause medial bowing of the medial collateral ligament (coronal series 6, image 20). There is mild intermediate T2 signal within the ligament likely from the surrounding foot and edema. No ligament tear is seen. The fibular collateral ligament, biceps femoris tendon, iliotibial band, and popliteus tendon are intact. CARTILAGE Patellofemoral: There is high-grade thinning of the inferior medial and inferolateral trochlear cartilage. High-grade thinning of the mid to inferior aspect of the lateral patellar facet cartilage. Medial: There is full-thickness cartilage loss within the far medial aspect of the weight-bearing medial femoral condyle where there is new cortical erosion as seen on yesterday's radiograph, new from 03/11/2023 radiographs. High-grade thinning of the rest of the medial compartment cartilage, greatest within the weight-bearing medial femoral condyle. Lateral: High-grade thinning throughout the weight-bearing lateral femoral condyle. Full-thickness cartilage loss throughout the mid AP dimension of the far lateral aspect of the lateral tibial plateau (sagittal series 4, image 26). Joint: Large joint effusion with high-grade synovial thickening and synovial enhancement. Scattered intermediate T2 signal synovitis.Normal Hoffa's fat pad. No plical thickening. Popliteal Fossa:  No Baker's cyst. Extensor Mechanism:  Intact quadriceps tendon and patellar tendon. Bones: There is high-grade marrow edema and enhancement throughout the entire medial femoral condyle and medial tibial plateau. There is cortical erosion within the medial aspect of the weight-bearing medial femoral condyle greater than the medial tibial plateau, as seen on yesterday's  radiographs. There is more mild subchondral marrow edema enhancement within the lateral femoral condyle and lateral tibial plateau which is also suspicious for osteomyelitis. Other: None. IMPRESSION: 1. Large joint effusion with high-grade synovial thickening and synovial enhancement. There is also high-grade marrow edema and enhancement throughout the entire medial femoral condyle and medial tibial plateau with cortical erosion within the medial aspect of the weight-bearing medial femoral condyle greater than the medial tibial plateau. More mild subchondral marrow edema enhancement within the lateral femoral condyle and lateral tibial plateau. Findings are highly suspicious for a septic arthritis and osteomyelitis. 2. Complex tearing throughout the majority of the posterior horn and body of the lateral meniscus. 3. High-grade attenuation of the medial meniscus with only a small portion of the predominantly peripheral third of the meniscal triangle remaining within the body, anterior horn, and posterior horn. 4. Tricompartmental cartilage degenerative changes, greatest within the medial compartment. Electronically Signed   By: Neita Garnet M.D.   On: 04/09/2023 10:41   US Venous Img Lower Unilateral Right  Result Date: 04/08/2023 CLINICAL DATA:  Right lower extremity swelling EXAM: Right LOWER EXTREMITY VENOUS DOPPLER ULTRASOUND TECHNIQUE: Gray-scale sonography with compression, as well as color and duplex ultrasound, were performed to evaluate the deep venous system(s) from the level of the common femoral vein through the popliteal and proximal calf veins. COMPARISON:  None Available. FINDINGS: VENOUS Normal compressibility of the common femoral, superficial femoral, and popliteal veins, as well as the visualized calf veins. Visualized portions of profunda femoral vein and great saphenous vein unremarkable. No filling defects to suggest DVT on grayscale or color Doppler imaging. Doppler waveforms show normal  direction of venous flow, normal respiratory plasticity and response to augmentation. Limited views of the contralateral common femoral vein are unremarkable. OTHER There is a oblong fluid  collection noted anterior to the knee in the area of pain measuring 4.7 x 1.4 x 5.0 cm. No abnormal blood flow on color Doppler. Limitations: none IMPRESSION: No evidence of right lower extremity DVT. Fluid collection anterior to the right knee in the area of pain as per the sonographer measuring 4.7 x 1.4 x 5.0 cm. This a differential. Please correlate for any signs of infection or trauma. Further workup as clinically appropriate or follow-up. Electronically Signed   By: Karen Kays M.D.   On: 04/08/2023 17:07   DG Knee Complete 4 Views Right  Result Date: 04/08/2023 CLINICAL DATA:  Right knee pain for 1 month.  No known injury. EXAM: RIGHT KNEE - COMPLETE 4+ VIEW COMPARISON:  Radiographs 03/11/2023 and 08/07/2020 FINDINGS: Suboptimal positioning, especially on the lateral view. The images are not labeled as having been obtained erect. There is new medial compartment joint space narrowing compared with the prior study of 4 weeks ago, and this is associated with probable erosive changes peripherally in the medial femoral condyle and medial tibial plateau. The lateral and patellofemoral joint spaces appear preserved. Persistent moderate to large knee joint effusion, similar to the recent prior study. No evidence of foreign body or soft tissue emphysema. IMPRESSION: New medial compartment joint space narrowing with probable erosive changes peripherally in the medial femoral condyle and medial tibial plateau. Persistent moderate to large knee joint effusion. Findings are suspicious for septic arthritis. Correlate clinically and consider further evaluation with MRI and arthrocentesis. Electronically Signed   By: Carey Bullocks M.D.   On: 04/08/2023 16:21    Microbiology: Results for orders placed or performed during the  hospital encounter of 04/08/23  Body fluid culture w Gram Stain     Status: None   Collection Time: 04/08/23  6:34 PM   Specimen: KNEE; Body Fluid  Result Value Ref Range Status   Specimen Description   Final    KNEE Performed at [facility], [address] [place], [place] [address]    Special Requests   Final    NONE Performed at [facility], [address] [place], [place] [address]    Gram Stain   Final    ABUNDANT WBC PRESENT, PREDOMINANTLY PMN RARE GRAM POSITIVE COCCI IN PAIRS IN CHAINS Gram Stain Report Called to,Read Back By and Verified With: RN . A. Normand Sloop [id number] @ 2236 FH    Culture   Final    FEW GROUP B STREP(S.AGALACTIAE)ISOLATED TESTING AGAINST S. AGALACTIAE NOT ROUTINELY PERFORMED DUE TO PREDICTABILITY OF AMP/PEN/VAN SUSCEPTIBILITY. Performed at [facility], [address], [place], [place] [address]    Report Status 04/10/2023 FINAL  Final  Culture, blood (Routine X 2) w Reflex to ID Panel     Status: None   Collection Time: 04/09/23  6:19 AM   Specimen: BLOOD RIGHT HAND  Result Value Ref Range Status   Specimen Description BLOOD RIGHT HAND  Final   Special Requests   Final    BOTTLES DRAWN AEROBIC ONLY Blood Culture results may not be optimal due to an inadequate volume of blood received in culture bottles   Culture   Final    NO GROWTH 5 DAYS Performed at [facility], [address] [place], [place] [address]    Report Status 04/14/2023 FINAL  Final  Culture, blood (Routine X 2) w Reflex to ID Panel     Status: None   Collection Time: 04/09/23  8:06 AM  Specimen: BLOOD  Result Value Ref Range Status   Specimen Description BLOOD SITE NOT SPECIFIED  Final   Special Requests   Final    BOTTLES DRAWN AEROBIC AND ANAEROBIC Blood Culture results may not be optimal due to an inadequate volume of blood received in culture bottles   Culture   Final    NO GROWTH 5 DAYS Performed at [facility], [address] [place], [place] [address]    Report Status 04/14/2023 FINAL  Final  Aerobic/Anaerobic Culture w Gram Stain (surgical/deep wound)     Status: None   Collection Time: 04/09/23  3:28 PM   Specimen: Abscess  Result Value Ref Range Status   Specimen Description ABSCESS  Final   Special Requests right knee  Final   Gram Stain   Final    ABUNDANT WBC PRESENT, PREDOMINANTLY PMN NO ORGANISMS SEEN    Culture   Final    MODERATE GROUP B STREP(S.AGALACTIAE)ISOLATED TESTING AGAINST S. AGALACTIAE NOT ROUTINELY PERFORMED DUE TO PREDICTABILITY OF AMP/PEN/VAN SUSCEPTIBILITY. NO ANAEROBES ISOLATED Performed at [facility], [address], [place], [place] [address]    Report Status 04/14/2023 FINAL  Final  Surgical pcr screen     Status: None   Collection Time: 04/14/23 11:40 AM   Specimen: Nasal Mucosa; Nasal Swab  Result Value Ref Range Status   MRSA, PCR NEGATIVE NEGATIVE Final   Staphylococcus aureus NEGATIVE NEGATIVE Final    Comment: (NOTE) The Xpert SA Assay (FDA approved for NASAL specimens in patients 27 years of age and older), is one component of a comprehensive surveillance program. It is not intended to diagnose infection nor to guide or monitor treatment. Performed at [facility], [address] [place], [place] [address]     Labs: CBC: Recent Labs  Lab 04/15/23 1803 04/16/23 0322 04/16/23 1946 04/17/23 1007 04/18/23 0552 04/19/23 0305  WBC 11.7* 10.7*  --  11.3* 12.0* 10.0  NEUTROABS 10.5* 8.9*  --  8.0* 8.2* 7.4  HGB 6.6* 6.4* 7.1* 6.8* 8.1* 7.2*  HCT 23.8* 23.1* 24.5* 23.8* 28.0* 25.5*  MCV 70.4* 71.7*  --  74.6* 75.7* 76.1*  PLT 453* 404*  --  403* 427* 372   Basic Metabolic Panel: Recent Labs  Lab 04/15/23 0311 04/16/23 0322 04/17/23 1007 04/18/23 0552 04/19/23 0305  NA 135 138 140 138 138  K 4.0 4.5 3.6 3.9 4.3  CL 100 104 104 104 104  CO2 25 24 25 26 26   GLUCOSE 125* 143* 116* 96 104*  BUN 9 12 17  21* 20  CREATININE  0.57 0.56 0.55 0.59 0.58  CALCIUM 8.9 9.1 8.5* 8.6* 8.4*  MG  --  1.8 1.7 2.0 1.9  PHOS  --  3.4 3.6 3.7 3.7   Liver Function Tests: Recent Labs  Lab 04/13/23 0332 04/16/23 0322 04/17/23 1007 04/18/23 0552 04/19/23 0305  AST 12* 16 15 12* 13*  ALT 10 12 13 15 13   ALKPHOS 64 58 57 63 57  BILITOT <0.1* 0.2* 0.3 0.3 0.2*  PROT 6.9 6.7 6.2* 6.7 6.3*  ALBUMIN 2.9* 2.8* 2.8* 3.0* 2.8*   CBG: No results for input(s): "GLUCAP" in the last 168 hours.  Discharge time spent: approximately 35 minutes spent on discharge counseling, evaluation of patient on day of discharge, and coordination of discharge planning with nursing, social work, pharmacy and case management  Signed: Alberteen Sam, MD [facility] 04/19/2023

## 2023-04-19 NOTE — Progress Notes (Signed)
Physical Therapy Treatment Patient Details Name: Sharon Faulkner MRN: [id number] DOB: 01/14/1976 Today's Date: 04/19/2023   History of Present Illness 47 yr old female who presented to ED with R knee and leg pain and swelling that has been present since 7/28. Prior ED visit 7/31 x-ray was clear of fracture, but showed effusion. Unable to draw any fluid then 8/28 R knee aspirated with small volume of purulent fluid attained. Pt now s/p 8/29 R knee arthroscopic I&D which revealed chronic infection and component of osteomyelitis. s/p I&D and TKA 04/14/23.  PMH: Asthma and PE not on anticoagulation.    PT Comments  Supv-Mod Ind with mobility. Pt tolerated activity well-reports moderate pain and fatigue. She feels ready to d/c home on today. Encouraged ambulation and for her to continue working on knee flexion ROM as tolerated. All PT education completed.     If plan is discharge home, recommend the following: Assistance with cooking/housework;Assist for transportation;Help with stairs or ramp for entrance;A little help with walking and/or transfers;A little help with bathing/dressing/bathroom   Can travel by private vehicle        Equipment Recommendations  Rolling walker (2 wheels)    Recommendations for Other Services       Precautions / Restrictions Precautions Precautions: Fall Precaution Comments: reviewed no pillow under knee Restrictions Weight Bearing Restrictions: No RLE Weight Bearing: Weight bearing as tolerated     Mobility  Bed Mobility               General bed mobility comments: oob in recliner    Transfers Overall transfer level: Needs assistance Equipment used: Rolling walker (2 wheels) Transfers: Sit to/from Stand Sit to Stand: Modified independent (Device/Increase time)                Ambulation/Gait Ambulation/Gait assistance: Supervision Gait Distance (Feet): 40 Feet   Gait Pattern/deviations: Step-to pattern       General Gait Details: Supv  for safety. Slow gait speed. Pt denied dizziness but reports fatigue.   Stairs             Wheelchair Mobility     Tilt Bed    Modified Rankin (Stroke Patients Only)       Balance Overall balance assessment: Needs assistance         Standing balance support: During functional activity, Reliant on assistive device for balance, Bilateral upper extremity supported Standing balance-Leahy Scale: Fair                              Cognition Arousal: Alert Behavior During Therapy: WFL for tasks assessed/performed Overall Cognitive Status: Within Functional Limits for tasks assessed                                 General Comments: "I have to do this myself".  Pt lives with and care for her mother.        Exercises      General Comments        Pertinent Vitals/Pain Pain Assessment Pain Assessment: Faces Faces Pain Scale: Hurts even more Pain Location: R knee/thigh Pain Descriptors / Indicators: Aching, Discomfort, Constant, Guarding, Operative site guarding Pain Intervention(s): Monitored during session, Repositioned    Home Living                          Prior Function  PT Goals (current goals can now be found in the care plan section) Progress towards PT goals: Progressing toward goals    Frequency    7X/week      PT Plan      Co-evaluation              AM-PAC PT "6 Clicks" Mobility   Outcome Measure  Help needed turning from your back to your side while in a flat bed without using bedrails?: None Help needed moving from lying on your back to sitting on the side of a flat bed without using bedrails?: None Help needed moving to and from a bed to a chair (including a wheelchair)?: None Help needed standing up from a chair using your arms (e.g., wheelchair or bedside chair)?: None Help needed to walk in hospital room?: A Little Help needed climbing 3-5 steps with a railing? : A Little 6  Click Score: 22    End of Session Equipment Utilized During Treatment: Gait belt Activity Tolerance: Patient tolerated treatment well Patient left: in chair;with call bell/phone within reach;with nursing/sitter in room   PT Visit Diagnosis: Unsteadiness on feet (R26.81);Other abnormalities of gait and mobility (R26.89);Muscle weakness (generalized) (M62.81);Difficulty in walking, not elsewhere classified (R26.2);Pain Pain - Right/Left: Right Pain - part of body: Knee     Time: 1135-1202 PT Time Calculation (min) (ACUTE ONLY): 27 min  Charges:    $Gait Training: 23-37 mins PT General Charges $$ ACUTE PT VISIT: 1 Visit                         Faye Ramsay, PT Acute Rehabilitation  Office: [phone]

## 2023-04-20 LAB — TYPE AND SCREEN
ABO/RH(D): O POS
Antibody Screen: NEGATIVE
Unit division: 0
Unit division: 0

## 2023-04-20 LAB — BPAM RBC
Blood Product Expiration Date: 202410022359
Blood Product Expiration Date: 202410042359
ISSUE DATE / TIME: 202409051110
ISSUE DATE / TIME: 202409061511
Unit Type and Rh: 5100
Unit Type and Rh: 5100

## 2023-05-07 ENCOUNTER — Ambulatory Visit: Payer: Medicaid Other | Attending: Family Medicine | Admitting: Family Medicine

## 2023-05-07 ENCOUNTER — Encounter: Payer: Self-pay | Admitting: Family Medicine

## 2023-05-07 VITALS — BP 165/102 | HR 93 | Ht 64.0 in | Wt 273.4 lb

## 2023-05-07 DIAGNOSIS — M009 Pyogenic arthritis, unspecified: Secondary | ICD-10-CM | POA: Diagnosis not present

## 2023-05-07 DIAGNOSIS — I10 Essential (primary) hypertension: Secondary | ICD-10-CM

## 2023-05-07 DIAGNOSIS — D509 Iron deficiency anemia, unspecified: Secondary | ICD-10-CM | POA: Diagnosis not present

## 2023-05-07 DIAGNOSIS — Z13228 Encounter for screening for other metabolic disorders: Secondary | ICD-10-CM | POA: Diagnosis not present

## 2023-05-07 MED ORDER — FERROUS SULFATE 325 (65 FE) MG PO TABS: 325.0000 mg | ORAL_TABLET | ORAL | 3 refills | Status: AC

## 2023-05-07 MED ORDER — AMLODIPINE BESYLATE 5 MG PO TABS: 5.0000 mg | ORAL_TABLET | Freq: Every day | ORAL | 1 refills | Status: DC

## 2023-05-07 MED ORDER — DULOXETINE HCL 60 MG PO CPEP: 60.0000 mg | ORAL_CAPSULE | Freq: Every day | ORAL | 3 refills | Status: DC

## 2023-05-07 NOTE — Progress Notes (Signed)
Subjective:  Patient ID: Sharon Faulkner, female    DOB: [birth year]  Age: 47 y.o. MRN: [id number]  CC: Hospitalization Follow-up (Swelling in right leg/Elevated BP/Medication for pain.)   HPI Sharon Faulkner is a 47 y.o. year old female with a history of Obesity, asthma, previous PE (completed anticoagulation) hypertension here to establish care.  Interval History: Discussed the use of AI scribe software for clinical note transcription with the patient, who gave verbal consent to proceed.  One month ago she was hospitalized for septic arthritis of the right knee with Osteomyelitis(secondary to group B strep)  s/p irrigation and debridement and right total knee arthroplasty and she presents with ongoing pain and swelling in the right knee. She reports an allergic reaction to hydrocodone, prescribed by the orthopedic surgeon, manifesting as severe itching and hives. She has been managing the pain with Tylenol and methocarbamol, but reports that the Tylenol is minimally effective. She is currently on IV antibiotics and methocarbamol and take Tylenol as needed.  The patient also reports significant swelling in the right leg, which has not improved in the month since surgery. She expresses frustration with the slow recovery process and the impact on her mobility and quality of life. She also reports back pain, which she attributes to her weight and the strain of walking with the painful, swollen knee.  In addition to the knee issues, the patient had anemia for which she received replacement therapy during hospitalization and at the time of discharge Hgb was 7.2, for which she was prescribed iron sulfate. However, she reports that the prescription was not sent to the pharmacy as expected.  She has since seen her orthopedic for hospital follow-up but has an upcoming appointment with infectious disease.       Past Medical History:  Diagnosis Date   Asthma     Past Surgical History:   Procedure Laterality Date   INCISION AND DRAINAGE ABSCESS Right 04/14/2023   Procedure: INCISION AND DRAINAGE RIGHT KNEE;  Surgeon: Sheral Apley, MD;  Location: [facility];  Service: Orthopedics;  Laterality: Right;   KNEE ARTHROSCOPY Right 04/09/2023   Procedure: ARTHROSCOPY KNEE RIGHT;  Surgeon: Sheral Apley, MD;  Location: [facility] OR;  Service: Orthopedics;  Laterality: Right;   TOTAL KNEE ARTHROPLASTY Right 04/14/2023   Procedure: TOTAL KNEE ARTHROPLASTY;  Surgeon: Sheral Apley, MD;  Location: [facility];  Service: Orthopedics;  Laterality: Right;    No family history on file.  Social History   Socioeconomic History   Marital status: Single    Spouse name: Not on file   Number of children: Not on file   Years of education: Not on file   Highest education level: Not on file  Occupational History   Not on file  Tobacco Use   Smoking status: Every Day    Current packs/day: 0.50    Types: Cigarettes   Smokeless tobacco: Never  Substance and Sexual Activity   Alcohol use: Not on file    Comment: occasional   Drug use: No   Sexual activity: Yes  Other Topics Concern   Not on file  Social History Narrative   Not on file   Social Determinants of Health   Financial Resource Strain: Not on file  Food Insecurity: Not on file  Transportation Needs: Not on file  Physical Activity: Not on file  Stress: Not on file  Social Connections: Not on file    Allergies  Allergen Reactions   Shellfish Allergy Anaphylaxis  and Itching   Cocos Nucifera Hives   Codeine Itching   Mango Flavor Itching    Outpatient Medications Prior to Visit  Medication Sig Dispense Refill   acetaminophen (TYLENOL) 500 MG tablet Take 2 tablets (1,000 mg total) by mouth in the morning, at noon, and at bedtime.     albuterol (VENTOLIN HFA) 108 (90 Base) MCG/ACT inhaler Inhale 2 puffs into the lungs every 6 (six) hours as needed for wheezing or shortness of breath. 8 g 3   aspirin EC 81 MG tablet Take 1  tablet (81 mg total) by mouth 2 (two) times daily. To prevent blood clots for 30 days after surgery. 60 tablet 0   cefTRIAXone (ROCEPHIN) IVPB Inject 2 g into the vein daily. Indication:  GBS R-septic knee/osteo First Dose: Yes Last Day of Therapy:  05/26/23 Labs - Once weekly:  CBC/D and BMP, Labs - Once weekly: ESR and CRP Method of administration: IV Push Method of administration may be changed at the discretion of home infusion pharmacist based upon assessment of the patient and/or caregiver's ability to self-administer the medication ordered. 40 Units 0   methocarbamol (ROBAXIN) 500 MG tablet Take 1 tablet (500 mg total) by mouth every 6 (six) hours as needed for muscle spasms. 30 tablet 0   naproxen sodium (ALEVE) 220 MG tablet Take 1 tablet (220 mg total) by mouth 2 (two) times daily with a meal. (Patient not taking: Reported on 05/07/2023)     docusate sodium (COLACE) 100 MG capsule Take 1 capsule (100 mg total) by mouth 2 (two) times daily. (Patient not taking: Reported on 05/07/2023)     ferrous sulfate 325 (65 FE) MG tablet Take 1 tablet (325 mg total) by mouth every other day. (Patient not taking: Reported on 05/07/2023) 30 tablet 3   polyethylene glycol (MIRALAX / GLYCOLAX) 17 g packet Take 17 g by mouth daily as needed for mild constipation. (Patient not taking: Reported on 05/07/2023) 14 each 0   No facility-administered medications prior to visit.     ROS Review of Systems  Constitutional:  Negative for activity change and appetite change.  HENT:  Negative for sinus pressure and sore throat.   Respiratory:  Negative for chest tightness, shortness of breath and wheezing.   Cardiovascular:  Negative for chest pain and palpitations.  Gastrointestinal:  Negative for abdominal distention, abdominal pain and constipation.  Genitourinary: Negative.   Musculoskeletal:        See HPI  Psychiatric/Behavioral:  Negative for behavioral problems and dysphoric mood.     Objective:  BP  (!) 165/102   Pulse 93   Ht 5\' 4"  (1.626 m)   Wt 273 lb 6.4 oz (124 kg)   LMP 03/09/2023 (Approximate) Comment: 04/09/23 Neg pregnancy test  SpO2 98%   BMI 46.93 kg/m      05/07/2023   10:38 AM 05/07/2023    9:35 AM 04/19/2023   12:56 PM  BP/Weight  Systolic BP 165 174 145  Diastolic BP 102 125 82  Wt. (Lbs)  273.4   BMI  46.93 kg/m2       Physical Exam Constitutional:      Appearance: She is well-developed. She is obese.  Cardiovascular:     Rate and Rhythm: Normal rate.     Heart sounds: Normal heart sounds. No murmur heard. Pulmonary:     Effort: Pulmonary effort is normal.     Breath sounds: Normal breath sounds. No wheezing or rales.  Chest:  Chest wall: No tenderness.  Abdominal:     General: Bowel sounds are normal. There is no distension.     Palpations: Abdomen is soft. There is no mass.     Tenderness: There is no abdominal tenderness.  Musculoskeletal:     Right lower leg: Edema present.     Comments: Negative Denna Haggard' sign Steri-Strips in place and anterior aspect of right knee Decreased range of motion of right knee. Left lower extremity is normal.  Neurological:     Mental Status: She is alert and oriented to person, place, and time.  Psychiatric:        Mood and Affect: Mood normal.        Latest Ref Rng & Units 04/19/2023    3:05 AM 04/18/2023    5:52 AM 04/17/2023   10:07 AM  CMP  Glucose 70 - 99 mg/dL 161  96  096   BUN 6 - 20 mg/dL 20  21  17    Creatinine 0.44 - 1.00 mg/dL 0.45  4.09  8.11   Sodium 135 - 145 mmol/L 138  138  140   Potassium 3.5 - 5.1 mmol/L 4.3  3.9  3.6   Chloride 98 - 111 mmol/L 104  104  104   CO2 22 - 32 mmol/L 26  26  25    Calcium 8.9 - 10.3 mg/dL 8.4  8.6  8.5   Total Protein 6.5 - 8.1 g/dL 6.3  6.7  6.2   Total Bilirubin 0.3 - 1.2 mg/dL 0.2  0.3  0.3   Alkaline Phos 38 - 126 U/L 57  63  57   AST 15 - 41 U/L 13  12  15    ALT 0 - 44 U/L 13  15  13      Lipid Panel     Component Value Date/Time   TRIG 91  07/16/2020 1138    CBC    Component Value Date/Time   WBC 10.0 04/19/2023 0305   RBC 3.35 (L) 04/19/2023 0305   HGB 7.2 (L) 04/19/2023 0305   HCT 25.5 (L) 04/19/2023 0305   PLT 372 04/19/2023 0305   MCV 76.1 (L) 04/19/2023 0305   MCH 21.5 (L) 04/19/2023 0305   MCHC 28.2 (L) 04/19/2023 0305   RDW 25.0 (H) 04/19/2023 0305   LYMPHSABS 1.5 04/19/2023 0305   MONOABS 0.7 04/19/2023 0305   EOSABS 0.3 04/19/2023 0305   BASOSABS 0.0 04/19/2023 0305    No results found for: "HGBA1C"  Assessment & Plan:      Right knee septic arthritis Status post right knee I&D, TKR On IV Rocephin.  Allergic reaction to hydrocodone. Pain managed with Tylenol and methocarbamol, but with limited efficacy. Swelling in the leg post-surgery. -Negative Homans' sign Counseled on symptoms suggestive of DVT -Discontinue hydrocodone due to allergic reaction. -Start duloxetine for pain management. -Continue methocarbamol and Tylenol as needed. -Continue aspirin for blood clot prevention. -Advise elevation of leg to help with swelling. -Order blood tests to monitor anemia and infection status.  Hypertension Blood pressure elevated. -Start amlodipine 5mg  daily. -Recheck blood pressure in one month. -Counseled on blood pressure goal of less than 130/80, low-sodium, DASH diet, medication compliance, 150 minutes of moderate intensity exercise per week. Discussed medication compliance, adverse effects.   Anemia Iron deficiency noted in hospital, but no iron supplement prescription received. -Prescribe ferrous sulfate for iron deficiency. -Order repeat blood tests to monitor anemia.  General Health Maintenance -Order cholesterol check due to fasting state. -Follow-up appointment in one  month.          Meds ordered this encounter  Medications   amLODipine (NORVASC) 5 MG tablet    Sig: Take 1 tablet (5 mg total) by mouth daily.    Dispense:  90 tablet    Refill:  1   DULoxetine (CYMBALTA) 60 MG  capsule    Sig: Take 1 capsule (60 mg total) by mouth daily.    Dispense:  30 capsule    Refill:  3   ferrous sulfate 325 (65 FE) MG tablet    Sig: Take 1 tablet (325 mg total) by mouth every other day.    Dispense:  30 tablet    Refill:  3    Follow-up: Return in about 1 month (around 06/06/2023) for Blood Pressure follow-up with PCP.       Hoy Register, MD, FAAFP. [facility] [place], [place] [phone]   05/07/2023, 3:46 PM

## 2023-05-07 NOTE — Patient Instructions (Signed)
Managing Your Hypertension Hypertension, also called high blood pressure, is when the force of the blood pressing against the walls of the arteries is too strong. Arteries are blood vessels that carry blood from your heart throughout your body. Hypertension forces the heart to work harder to pump blood and may cause the arteries to become narrow or stiff. Understanding blood pressure readings A blood pressure reading includes a higher number over a lower number: The first, or top, number is called the systolic pressure. It is a measure of the pressure in your arteries as your heart beats. The second, or bottom number, is called the diastolic pressure. It is a measure of the pressure in your arteries as the heart relaxes. For most people, a normal blood pressure is below 120/80. Your personal target blood pressure may vary depending on your medical conditions, your age, and other factors. Blood pressure is classified into four stages. Based on your blood pressure reading, your health care provider may use the following stages to determine what type of treatment you need, if any. Systolic pressure and diastolic pressure are measured in a unit called millimeters of mercury (mmHg). Normal Systolic pressure: below 120. Diastolic pressure: below 80. Elevated Systolic pressure: 120-129. Diastolic pressure: below 80. Hypertension stage 1 Systolic pressure: 130-139. Diastolic pressure: 80-89. Hypertension stage 2 Systolic pressure: 140 or above. Diastolic pressure: 90 or above. How can this condition affect me? Managing your hypertension is very important. Over time, hypertension can damage the arteries and decrease blood flow to parts of the body, including the brain, heart, and kidneys. Having untreated or uncontrolled hypertension can lead to: A heart attack. A stroke. A weakened blood vessel (aneurysm). Heart failure. Kidney damage. Eye damage. Memory and concentration problems. Vascular  dementia. What actions can I take to manage this condition? Hypertension can be managed by making lifestyle changes and possibly by taking medicines. Your health care provider will help you make a plan to bring your blood pressure within a normal range. You may be referred for counseling on a healthy diet and physical activity. Nutrition  Eat a diet that is high in fiber and potassium, and low in salt (sodium), added sugar, and fat. An example eating plan is called the DASH diet. DASH stands for Dietary Approaches to Stop Hypertension. To eat this way: Eat plenty of fresh fruits and vegetables. Try to fill one-half of your plate at each meal with fruits and vegetables. Eat whole grains, such as whole-wheat pasta, brown rice, or whole-grain bread. Fill about one-fourth of your plate with whole grains. Eat low-fat dairy products. Avoid fatty cuts of meat, processed or cured meats, and poultry with skin. Fill about one-fourth of your plate with lean proteins such as fish, chicken without skin, beans, eggs, and tofu. Avoid pre-made and processed foods. These tend to be higher in sodium, added sugar, and fat. Reduce your daily sodium intake. Many people with hypertension should eat less than 1,500 mg of sodium a day. Lifestyle  Work with your health care provider to maintain a healthy body weight or to lose weight. Ask what an ideal weight is for you. Get at least 30 minutes of exercise that causes your heart to beat faster (aerobic exercise) most days of the week. Activities may include walking, swimming, or biking. Include exercise to strengthen your muscles (resistance exercise), such as weight lifting, as part of your weekly exercise routine. Try to do these types of exercises for 30 minutes at least 3 days a week. Do   not use any products that contain nicotine or tobacco. These products include cigarettes, chewing tobacco, and vaping devices, such as e-cigarettes. If you need help quitting, ask your  health care provider. Control any long-term (chronic) conditions you have, such as high cholesterol or diabetes. Identify your sources of stress and find ways to manage stress. This may include meditation, deep breathing, or making time for fun activities. Alcohol use Do not drink alcohol if: Your health care provider tells you not to drink. You are pregnant, may be pregnant, or are planning to become pregnant. If you drink alcohol: Limit how much you have to: 0-1 drink a day for women. 0-2 drinks a day for men. Know how much alcohol is in your drink. In the [place] one drink equals one 12 oz bottle of beer (355 mL), one 5 oz glass of wine (148 mL), or one 1 oz glass of hard liquor (44 mL). Medicines Your health care provider may prescribe medicine if lifestyle changes are not enough to get your blood pressure under control and if: Your systolic blood pressure is 130 or higher. Your diastolic blood pressure is 80 or higher. Take medicines only as told by your health care provider. Follow the directions carefully. Blood pressure medicines must be taken as told by your health care provider. The medicine does not work as well when you skip doses. Skipping doses also puts you at risk for problems. Monitoring Before you monitor your blood pressure: Do not smoke, drink caffeinated beverages, or exercise within 30 minutes before taking a measurement. Use the bathroom and empty your bladder (urinate). Sit quietly for at least 5 minutes before taking measurements. Monitor your blood pressure at home as told by your health care provider. To do this: Sit with your back straight and supported. Place your feet flat on the floor. Do not cross your legs. Support your arm on a flat surface, such as a table. Make sure your upper arm is at heart level. Each time you measure, take two or three readings one minute apart and record the results. You may also need to have your blood pressure checked regularly by  your health care provider. General information Talk with your health care provider about your diet, exercise habits, and other lifestyle factors that may be contributing to hypertension. Review all the medicines you take with your health care provider because there may be side effects or interactions. Keep all follow-up visits. Your health care provider can help you create and adjust your plan for managing your high blood pressure. Where to find more information National Heart, Lung, and Blood Institute: www.nhlbi.nih.gov American Heart Association: www.heart.org Contact a health care provider if: You think you are having a reaction to medicines you have taken. You have repeated (recurrent) headaches. You feel dizzy. You have swelling in your ankles. You have trouble with your vision. Get help right away if: You develop a severe headache or confusion. You have unusual weakness or numbness, or you feel faint. You have severe pain in your chest or abdomen. You vomit repeatedly. You have trouble breathing. These symptoms may be an emergency. Get help right away. Call 911. Do not wait to see if the symptoms will go away. Do not drive yourself to the hospital. Summary Hypertension is when the force of blood pumping through your arteries is too strong. If this condition is not controlled, it may put you at risk for serious complications. Your personal target blood pressure may vary depending on your medical conditions,   your age, and other factors. For most people, a normal blood pressure is less than 120/80. Hypertension is managed by lifestyle changes, medicines, or both. Lifestyle changes to help manage hypertension include losing weight, eating a healthy, low-sodium diet, exercising more, stopping smoking, and limiting alcohol. This information is not intended to replace advice given to you by your health care provider. Make sure you discuss any questions you have with your health care  provider. Document Revised: 04/11/2021 Document Reviewed: 04/11/2021 Elsevier Patient Education  2024 Elsevier Inc.  

## 2023-05-08 LAB — CMP14+EGFR
ALT: 9 [IU]/L (ref 0–32)
AST: 14 [IU]/L (ref 0–40)
Albumin: 4.3 g/dL (ref 3.9–4.9)
Alkaline Phosphatase: 95 [IU]/L (ref 44–121)
BUN/Creatinine Ratio: 14 (ref 9–23)
BUN: 8 mg/dL (ref 6–24)
Bilirubin Total: 0.3 mg/dL (ref 0.0–1.2)
CO2: 23 mmol/L (ref 20–29)
Calcium: 9.4 mg/dL (ref 8.7–10.2)
Chloride: 101 mmol/L (ref 96–106)
Creatinine, Ser: 0.59 mg/dL (ref 0.57–1.00)
Globulin, Total: 2.7 g/dL (ref 1.5–4.5)
Glucose: 97 mg/dL (ref 70–99)
Potassium: 3.8 mmol/L (ref 3.5–5.2)
Sodium: 141 mmol/L (ref 134–144)
Total Protein: 7 g/dL (ref 6.0–8.5)
eGFR: 111 mL/min/{1.73_m2} (ref >59)

## 2023-05-08 LAB — CBC WITH DIFFERENTIAL/PLATELET
Basophils Absolute: 0 10*3/uL (ref 0.0–0.2)
Basos: 1 %
EOS (ABSOLUTE): 0.5 10*3/uL — ABNORMAL HIGH (ref 0.0–0.4)
Eos: 7 %
Hematocrit: 36.6 % (ref 34.0–46.6)
Hemoglobin: 10.6 g/dL — ABNORMAL LOW (ref 11.1–15.9)
Immature Grans (Abs): 0 10*3/uL (ref 0.0–0.1)
Immature Granulocytes: 0 %
Lymphocytes Absolute: 1.3 10*3/uL (ref 0.7–3.1)
Lymphs: 18 %
MCH: 22.5 pg — ABNORMAL LOW (ref 26.6–33.0)
MCHC: 29 g/dL — ABNORMAL LOW (ref 31.5–35.7)
MCV: 78 fL — ABNORMAL LOW (ref 79–97)
Monocytes Absolute: 0.5 10*3/uL (ref 0.1–0.9)
Monocytes: 7 %
Neutrophils Absolute: 4.9 10*3/uL (ref 1.4–7.0)
Neutrophils: 67 %
Platelets: 366 10*3/uL (ref 150–450)
RBC: 4.72 x10E6/uL (ref 3.77–5.28)
RDW: 22 % — ABNORMAL HIGH (ref 11.7–15.4)
WBC: 7.3 10*3/uL (ref 3.4–10.8)

## 2023-05-08 LAB — LP+NON-HDL CHOLESTEROL
Cholesterol, Total: 139 mg/dL (ref 100–199)
HDL: 59 mg/dL (ref >39)
LDL Chol Calc (NIH): 59 mg/dL (ref 0–99)
Total Non-HDL-Chol (LDL+VLDL): 80 mg/dL (ref 0–129)
Triglycerides: 118 mg/dL (ref 0–149)
VLDL Cholesterol Cal: 21 mg/dL (ref 5–40)

## 2023-05-08 LAB — HEMOGLOBIN A1C
Est. average glucose Bld gHb Est-mCnc: 97 mg/dL
Hgb A1c MFr Bld: 5 % (ref 4.8–5.6)

## 2023-05-21 ENCOUNTER — Other Ambulatory Visit: Payer: Self-pay

## 2023-05-21 ENCOUNTER — Telehealth: Payer: Self-pay

## 2023-05-21 ENCOUNTER — Encounter: Payer: Self-pay | Admitting: Internal Medicine

## 2023-05-21 ENCOUNTER — Ambulatory Visit (INDEPENDENT_AMBULATORY_CARE_PROVIDER_SITE_OTHER): Payer: Medicaid Other | Admitting: Internal Medicine

## 2023-05-21 VITALS — BP 115/82 | HR 94 | Temp 98.2°F

## 2023-05-21 DIAGNOSIS — M00261 Other streptococcal arthritis, right knee: Secondary | ICD-10-CM

## 2023-05-21 MED ORDER — CEFADROXIL 500 MG PO CAPS: 1000.0000 mg | ORAL_CAPSULE | Freq: Two times a day (BID) | ORAL | 1 refills | Status: DC

## 2023-05-21 NOTE — Progress Notes (Signed)
Labs drawn via PICC line per Dr. Drue Second. Line flushed with 10 mL normal saline, heparin locked per patient request, and clamped. Patient tolerated procedure well.   Sandie Ano, RN

## 2023-05-21 NOTE — Telephone Encounter (Signed)
Per Dr. Drue Second [facility] can pull picc after last dose on 10/15. Community message forwarded to [employer] team. Patient aware of plan. Juanita Laster, RMA

## 2023-05-21 NOTE — Patient Instructions (Signed)
Start taking on 10/16. Cefadroxil 500mg  take 2 tabs twice a day until I see you in 6 wk

## 2023-05-21 NOTE — Progress Notes (Signed)
Patient ID: Sharon Faulkner, female   DOB: 1976-07-27, 47 y.o.   MRN: [id number]  HPI Sharon Faulkner is a 47yo F with group b strep septic arthritis/pji She has not missed dose, including doing infusion during visit. Doing it correctly with good adherence with doing infusion. Ros:- has nausea with duloxetine - self- d/c'd  Outpatient Encounter Medications as of 05/21/2023  Medication Sig   acetaminophen (TYLENOL) 500 MG tablet Take 2 tablets (1,000 mg total) by mouth in the morning, at noon, and at bedtime.   albuterol (VENTOLIN HFA) 108 (90 Base) MCG/ACT inhaler Inhale 2 puffs into the lungs every 6 (six) hours as needed for wheezing or shortness of breath.   amLODipine (NORVASC) 5 MG tablet Take 1 tablet (5 mg total) by mouth daily.   aspirin EC 81 MG tablet Take 1 tablet (81 mg total) by mouth 2 (two) times daily. To prevent blood clots for 30 days after surgery.   cefTRIAXone (ROCEPHIN) IVPB Inject 2 g into the vein daily. Indication:  GBS R-septic knee/osteo First Dose: Yes Last Day of Therapy:  05/26/23 Labs - Once weekly:  CBC/D and BMP, Labs - Once weekly: ESR and CRP Method of administration: IV Push Method of administration may be changed at the discretion of home infusion pharmacist based upon assessment of the patient and/or caregiver's ability to self-administer the medication ordered.   ferrous sulfate 325 (65 FE) MG tablet Take 1 tablet (325 mg total) by mouth every other day.   methocarbamol (ROBAXIN) 500 MG tablet Take 1 tablet (500 mg total) by mouth every 6 (six) hours as needed for muscle spasms.   DULoxetine (CYMBALTA) 60 MG capsule Take 1 capsule (60 mg total) by mouth daily. (Patient not taking: Reported on 05/21/2023)   naproxen sodium (ALEVE) 220 MG tablet Take 1 tablet (220 mg total) by mouth 2 (two) times daily with a meal. (Patient not taking: Reported on 05/07/2023)   No facility-administered encounter medications on file as of 05/21/2023.     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 appears well-developed and well-nourished. No distress.  HENT: [place]/AT, PERRLA, no scleral icterus Mouth/Throat: Oropharynx is clear and moist. No oropharyngeal exudate.  Cardiovascular: Normal rate, regular rhythm and normal heart sounds. Exam reveals no gallop and no friction rub.  No murmur heard.  Pulmonary/Chest: Effort normal and breath sounds normal. No respiratory distress.  has no wheezes.  Neck = supple, no nuchal rigidity Abdominal: Soft. Bowel sounds are normal.  exhibits no distension. There is no tenderness.  Lymphadenopathy: no cervical adenopathy. No axillary adenopathy Neurological: alert and oriented to person, place, and time.  Skin: Skin is warm and dry. No rash noted. No erythema.  Psychiatric: a normal mood and affect.  behavior is normal.    CBC Lab Results  Component Value Date   WBC 7.3 05/07/2023   RBC 4.72 05/07/2023   HGB 10.6 (L) 05/07/2023   HCT 36.6 05/07/2023   PLT 366 05/07/2023   MCV 78 (L) 05/07/2023   MCH 22.5 (L) 05/07/2023    MCHC 29.0 (L) 05/07/2023   RDW 22.0 (H) 05/07/2023   LYMPHSABS 1.3 05/07/2023   MONOABS 0.7 04/19/2023   EOSABS 0.5 (H) 05/07/2023    BMET Lab Results  Component Value Date   NA 141 05/07/2023   K 3.8 05/07/2023   CL 101 05/07/2023   CO2 23 05/07/2023   GLUCOSE 97 05/07/2023   BUN 8 05/07/2023   CREATININE 0.59 05/07/2023   CALCIUM 9.4 05/07/2023   GFRNONAA >60 04/19/2023    Lab Results  Component Value Date   ESRSEDRATE 33 (H) 05/21/2023   Lab Results  Component Value Date   CRP 88.8 (H) 05/21/2023     Assessment and Plan  Plan for 6 months of total treatment since surgery. Currently 6 wk post. Will get labs today;after finishiing IV abtx, plan to transition to oral abtx, will send in rx for cefadroxil to complete 6 month course  Rtc in 6 wk

## 2023-05-22 LAB — CBC WITH DIFFERENTIAL/PLATELET
Absolute Monocytes: 522 {cells}/uL (ref 200–950)
Basophils Absolute: 44 {cells}/uL (ref 0–200)
Basophils Relative: 0.5 %
Eosinophils Absolute: 531 {cells}/uL — ABNORMAL HIGH (ref 15–500)
Eosinophils Relative: 6.1 %
HCT: 32.1 % — ABNORMAL LOW (ref 35.0–45.0)
Hemoglobin: 9.3 g/dL — ABNORMAL LOW (ref 11.7–15.5)
Lymphs Abs: 1531 {cells}/uL (ref 850–3900)
MCH: 22.2 pg — ABNORMAL LOW (ref 27.0–33.0)
MCHC: 29 g/dL — ABNORMAL LOW (ref 32.0–36.0)
MCV: 76.8 fL — ABNORMAL LOW (ref 80.0–100.0)
MPV: 9.9 fL (ref 7.5–12.5)
Monocytes Relative: 6 %
Neutro Abs: 6073 {cells}/uL (ref 1500–7800)
Neutrophils Relative %: 69.8 %
Platelets: 378 10*3/uL (ref 140–400)
RBC: 4.18 10*6/uL (ref 3.80–5.10)
RDW: 19.8 % — ABNORMAL HIGH (ref 11.0–15.0)
Total Lymphocyte: 17.6 %
WBC: 8.7 10*3/uL (ref 3.8–10.8)

## 2023-05-22 LAB — BASIC METABOLIC PANEL
BUN: 8 mg/dL (ref 7–25)
CO2: 27 mmol/L (ref 20–32)
Calcium: 8.9 mg/dL (ref 8.6–10.2)
Chloride: 104 mmol/L (ref 98–110)
Creat: 0.54 mg/dL (ref 0.50–0.99)
Glucose, Bld: 100 mg/dL — ABNORMAL HIGH (ref 65–99)
Potassium: 4.2 mmol/L (ref 3.5–5.3)
Sodium: 140 mmol/L (ref 135–146)

## 2023-05-22 LAB — C-REACTIVE PROTEIN: CRP: 88.8 mg/L — ABNORMAL HIGH (ref <8.0)

## 2023-05-22 LAB — SEDIMENTATION RATE: Sed Rate: 33 mm/h — ABNORMAL HIGH (ref 0–20)

## 2023-06-08 ENCOUNTER — Ambulatory Visit: Payer: Medicaid Other | Attending: Family Medicine | Admitting: Family Medicine

## 2023-06-08 ENCOUNTER — Encounter: Payer: Self-pay | Admitting: Family Medicine

## 2023-06-08 VITALS — BP 141/87 | HR 91 | Ht 64.0 in | Wt 273.8 lb

## 2023-06-08 DIAGNOSIS — I10 Essential (primary) hypertension: Secondary | ICD-10-CM

## 2023-06-08 DIAGNOSIS — M009 Pyogenic arthritis, unspecified: Secondary | ICD-10-CM | POA: Diagnosis not present

## 2023-06-08 NOTE — Patient Instructions (Signed)
VISIT SUMMARY:  During today's visit, we discussed your ongoing leg pain, which has been causing significant discomfort and affecting your sleep and mobility. We also reviewed your high blood pressure, which has shown improvement with your current medication. Additionally, we talked about your general health maintenance needs, including upcoming routine screenings.  YOUR PLAN:  -CHRONIC PAIN: Chronic pain is long-lasting pain that can affect your daily activities and quality of life. We will refer you to a pain management specialist for further evaluation and treatment options since you have had adverse reactions to several pain medications.  -HYPERTENSION: Hypertension, or high blood pressure, is a condition where the force of the blood against your artery walls is too high. Your blood pressure has improved with amlodipine, and you should continue taking this medication. We will recheck your blood pressure at your next visit.  -GENERAL HEALTH MAINTENANCE: Routine health maintenance is important for early detection and prevention of diseases. You are due for a Pap smear and colonoscopy, but we will schedule these in 3 months once your leg pain has improved.  INSTRUCTIONS:  Please follow up with the pain management specialist as soon as possible for your chronic leg pain. Continue taking amlodipine for your high blood pressure and monitor your blood pressure regularly. We will recheck your blood pressure at your next visit. Once your leg pain has improved, we will schedule a complete physical, including a Pap smear and colonoscopy, in 3 months.

## 2023-06-08 NOTE — Progress Notes (Signed)
Subjective:  Patient ID: Sharon Faulkner, female    DOB: June 27, 1976  Age: 47 y.o. MRN: [id number]  CC: Hypertension (Referral to pain management)   HPI Sharon Faulkner is a 47 y.o. 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  She presents with ongoing discomfort despite attempts at pain management. She describes the pain as an 'aching' sensation that worsens after being up all day. The pain is so severe that it disrupts her sleep, causing her to describe her leg as 'running around the bed.' The pain also affects her mobility, causing her to shift her weight onto her other leg, which in turn causes hip and back pain.  The patient has tried various pain medications, including codeine and hydrocodone, but has had adverse reactions to both. She has also tried methocarbamol which has been ineffective.  At her last visit I had placed her on Duloxetine but she found this to be intolerable due to side effects such as nausea. She continues to follow-up with her orthopedic and she did have a visit with infectious disease on 05/2023 and still remains on her antibiotic infusion.  At her last visit amlodipine was initiated for blood pressure management and her blood pressure has improved today  Past Medical History:  Diagnosis Date   Asthma     Past Surgical History:  Procedure Laterality Date   INCISION AND DRAINAGE ABSCESS Right 04/14/2023   Procedure: INCISION AND DRAINAGE RIGHT KNEE;  Surgeon: Sheral Apley, MD;  Location: [facility] ORS;  Service: Orthopedics;  Laterality: Right;   KNEE ARTHROSCOPY Right 04/09/2023   Procedure: ARTHROSCOPY KNEE RIGHT;  Surgeon: Sheral Apley, MD;  Location: [facility] OR;  Service: Orthopedics;   Laterality: Right;   TOTAL KNEE ARTHROPLASTY Right 04/14/2023   Procedure: TOTAL KNEE ARTHROPLASTY;  Surgeon: Sheral Apley, MD;  Location: [facility] ORS;  Service: Orthopedics;  Laterality: Right;    No family history on file.  Social History   Socioeconomic History   Marital status: Single    Spouse name: Not on file   Number of children: Not on file   Years of education: Not on file   Highest education level: Not on file  Occupational History   Not on file  Tobacco Use   Smoking status: Every Day    Current packs/day: 0.50    Types: Cigarettes   Smokeless tobacco: Never  Substance and Sexual Activity   Alcohol use: Not on file    Comment: occasional   Drug use: No   Sexual activity: Yes  Other Topics Concern   Not on file  Social History Narrative   Not on file   Social Determinants of Health   Financial Resource Strain: High Risk (06/08/2023)   Overall Financial Resource Strain (CARDIA)    Difficulty of Paying Living Expenses: Hard  Food Insecurity: Food Insecurity Present (06/08/2023)   Hunger Vital Sign    Worried About Running Out of Food in the Last Year: Often true    Ran Out of Food in the Last Year: Sometimes true  Transportation Needs: No Transportation Needs (06/08/2023)   PRAPARE - Administrator, Civil Service (Medical): No    Lack of Transportation (Non-Medical): No  Physical Activity: Inactive (06/08/2023)   Exercise Vital  Sign    Days of Exercise per Week: 0 days    Minutes of Exercise per Session: 0 min  Stress: Stress Concern Present (06/08/2023)   [employer] of Occupational Health - Occupational Stress Questionnaire    Feeling of Stress : To some extent  Social Connections: Unknown (06/08/2023)   Social Connection and Isolation Panel [NHANES]    Frequency of Communication with Friends and Family: Three times a week    Frequency of Social Gatherings with Friends and Family: Once a week    Attends Religious Services: Never     Database administrator or Organizations: No    Attends Engineer, structural: Never    Marital Status: Patient declined    Allergies  Allergen Reactions   Shellfish Allergy Anaphylaxis and Itching   Cocos Nucifera Hives   Codeine Itching   Mango Flavor Itching    Outpatient Medications Prior to Visit  Medication Sig Dispense Refill   acetaminophen (TYLENOL) 500 MG tablet Take 2 tablets (1,000 mg total) by mouth in the morning, at noon, and at bedtime.     albuterol (VENTOLIN HFA) 108 (90 Base) MCG/ACT inhaler Inhale 2 puffs into the lungs every 6 (six) hours as needed for wheezing or shortness of breath. 8 g 3   amLODipine (NORVASC) 5 MG tablet Take 1 tablet (5 mg total) by mouth daily. 90 tablet 1   aspirin EC 81 MG tablet Take 1 tablet (81 mg total) by mouth 2 (two) times daily. To prevent blood clots for 30 days after surgery. 60 tablet 0   cefadroxil (DURICEF) 500 MG capsule Take 2 capsules (1,000 mg total) by mouth 2 (two) times daily. 120 capsule 1   ferrous sulfate 325 (65 FE) MG tablet Take 1 tablet (325 mg total) by mouth every other day. 30 tablet 3   methocarbamol (ROBAXIN) 500 MG tablet Take 1 tablet (500 mg total) by mouth every 6 (six) hours as needed for muscle spasms. 30 tablet 0   naproxen sodium (ALEVE) 220 MG tablet Take 1 tablet (220 mg total) by mouth 2 (two) times daily with a meal.     No facility-administered medications prior to visit.     ROS Review of Systems  Constitutional:  Negative for activity change and appetite change.  HENT:  Negative for sinus pressure and sore throat.   Respiratory:  Negative for chest tightness, shortness of breath and wheezing.   Cardiovascular:  Negative for chest pain and palpitations.  Gastrointestinal:  Negative for abdominal distention, abdominal pain and constipation.  Genitourinary: Negative.   Musculoskeletal:        See HPI  Psychiatric/Behavioral:  Negative for behavioral problems and dysphoric mood.      Objective:  BP (!) 141/87   Pulse 91   Ht 5\' 4"  (1.626 m)   Wt 273 lb 12.8 oz (124.2 kg)   LMP 05/17/2023   SpO2 98%   BMI 47.00 kg/m      06/08/2023    2:41 PM 05/21/2023    2:26 PM 05/07/2023   10:38 AM  BP/Weight  Systolic BP 141 115 165  Diastolic BP 87 82 102  Wt. (Lbs) 273.8    BMI 47 kg/m2        Physical Exam Constitutional:      Appearance: She is well-developed.  Cardiovascular:     Rate and Rhythm: Normal rate.     Heart sounds: Normal heart sounds. No murmur heard. Pulmonary:     Effort: Pulmonary  effort is normal.     Breath sounds: Normal breath sounds. No wheezing or rales.  Chest:     Chest wall: No tenderness.  Abdominal:     General: Bowel sounds are normal. There is no distension.     Palpations: Abdomen is soft. There is no mass.     Tenderness: There is no abdominal tenderness.  Musculoskeletal:     Right lower leg: Tenderness present. Edema present.     Left lower leg: No tenderness. No edema.  Neurological:     Mental Status: She is alert and oriented to person, place, and time.  Psychiatric:        Mood and Affect: Mood normal.        Latest Ref Rng & Units 05/21/2023    2:56 PM 05/07/2023   10:52 AM 04/19/2023    3:05 AM  CMP  Glucose 65 - 99 mg/dL 161  97  096   BUN 7 - 25 mg/dL 8  8  20    Creatinine 0.50 - 0.99 mg/dL 0.45  4.09  8.11   Sodium 135 - 146 mmol/L 140  141  138   Potassium 3.5 - 5.3 mmol/L 4.2  3.8  4.3   Chloride 98 - 110 mmol/L 104  101  104   CO2 20 - 32 mmol/L 27  23  26    Calcium 8.6 - 10.2 mg/dL 8.9  9.4  8.4   Total Protein 6.0 - 8.5 g/dL  7.0  6.3   Total Bilirubin 0.0 - 1.2 mg/dL  0.3  0.2   Alkaline Phos 44 - 121 IU/L  95  57   AST 0 - 40 IU/L  14  13   ALT 0 - 32 IU/L  9  13     Lipid Panel     Component Value Date/Time   CHOL 139 05/07/2023 1052   TRIG 118 05/07/2023 1052   HDL 59 05/07/2023 1052   LDLCALC 59 05/07/2023 1052    CBC    Component Value Date/Time   WBC 8.7 05/21/2023  1456   RBC 4.18 05/21/2023 1456   HGB 9.3 (L) 05/21/2023 1456   HGB 10.6 (L) 05/07/2023 1052   HCT 32.1 (L) 05/21/2023 1456   HCT 36.6 05/07/2023 1052   PLT 378 05/21/2023 1456   PLT 366 05/07/2023 1052   MCV 76.8 (L) 05/21/2023 1456   MCV 78 (L) 05/07/2023 1052   MCH 22.2 (L) 05/21/2023 1456   MCHC 29.0 (L) 05/21/2023 1456   RDW 19.8 (H) 05/21/2023 1456   RDW 22.0 (H) 05/07/2023 1052   LYMPHSABS 1,531 05/21/2023 1456   LYMPHSABS 1.3 05/07/2023 1052   MONOABS 0.7 04/19/2023 0305   EOSABS 531 (H) 05/21/2023 1456   EOSABS 0.5 (H) 05/07/2023 1052   BASOSABS 44 05/21/2023 1456   BASOSABS 0.0 05/07/2023 1052    Lab Results  Component Value Date   HGBA1C 5.0 05/07/2023    Assessment & Plan:      Right knee staphylococcal arthritis/chronic Pain Status post I&D, right TKR Pain in the leg causing difficulty in walking and sleep disturbances. Allergic to codeine and unable to tolerate tramadol. Duloxetine discontinued due to nausea. -Referral to pain management for further evaluation and management. -Continue with Robaxin and Tylenol as needed -Elevate leg and use ice  Hypertension Blood pressure improved since last visit (141/87 from 165/102) with amlodipine. -Continue amlodipine. -Recheck blood pressure at next visit. -Counseled on blood pressure goal of less than 130/80, low-sodium, DASH  diet, medication compliance, 150 minutes of moderate intensity exercise per week. Discussed medication compliance, adverse effects.   General Health Maintenance Due for Pap smear and colonoscopy, but currently unable due to leg pain. -Schedule for complete physical including Pap smear and colonoscopy in 3 months, pending improvement in leg pain.          No orders of the defined types were placed in this encounter.   Follow-up: Return in about 3 months (around 09/08/2023) for CPE/ Preventive Health Exam.       Hoy Register, MD, FAAFP. [facility] [place], [place] [phone]   06/08/2023, 4:00 PM

## 2023-06-21 NOTE — Therapy (Unsigned)
OUTPATIENT PHYSICAL THERAPY LOWER EXTREMITY EVALUATION   Patient Name: Sharon Faulkner MRN: [id number] DOB:October 06, 1975, 47 y.o., female Today's Date: 06/23/2023  END OF SESSION:  PT End of Session - 06/23/23 1548     Visit Number 1    Number of Visits 7    Date for PT Re-Evaluation 08/22/23    Authorization Type MCD    PT Start Time 1745    PT Stop Time 1830    PT Time Calculation (min) 45 min    Activity Tolerance Patient tolerated treatment well    Behavior During Therapy [facility] for tasks assessed/performed             Past Medical History:  Diagnosis Date   Asthma    Past Surgical History:  Procedure Laterality Date   INCISION AND DRAINAGE ABSCESS Right 04/14/2023   Procedure: INCISION AND DRAINAGE RIGHT KNEE;  Surgeon: Sheral Apley, MD;  Location: [facility];  Service: Orthopedics;  Laterality: Right;   KNEE ARTHROSCOPY Right 04/09/2023   Procedure: ARTHROSCOPY KNEE RIGHT;  Surgeon: Sheral Apley, MD;  Location: [facility] OR;  Service: Orthopedics;  Laterality: Right;   TOTAL KNEE ARTHROPLASTY Right 04/14/2023   Procedure: TOTAL KNEE ARTHROPLASTY;  Surgeon: Sheral Apley, MD;  Location: [facility];  Service: Orthopedics;  Laterality: Right;   Patient Active Problem List   Diagnosis Date Noted   Primary hypertension 05/07/2023   Concern for acute osteomyelitis of femoal condyle and tibial plateau (HCC) 04/19/2023   Obesity, Class III, BMI 40-49.9 (morbid obesity) (HCC) 04/19/2023   Asthma 04/09/2023   Acute on chronic anemia 04/09/2023   Septic arthritis of knee, 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  Required 6 weeks IV ABX then oral meds. Has been working on home based exercise for ROM and strength  PERTINENT HISTORY: HPI Sharon Faulkner is a 47 y.o. 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   She presents with ongoing discomfort despite attempts at pain management. She describes the pain as an 'aching' sensation that worsens after being up all day. The pain is so severe that it disrupts her sleep, causing her to describe her leg as 'running around the bed.' The pain also affects her mobility, causing her to shift her weight onto her other leg, which in turn causes hip and back pain.   The patient has tried various pain medications, including codeine and hydrocodone, but has had adverse reactions to both. She has also tried methocarbamol which has been ineffective.  At her last visit I had placed her on Duloxetine but she found this to be intolerable due to side effects such as nausea. She continues to follow-up with her orthopedic and she did have a visit with infectious disease on 05/2023 and  still remains on her antibiotic infusion. PAIN:  Are you having pain? Yes: NPRS scale: 10+/10 Pain location: R knee Pain description: ache, throb Aggravating factors: weightbearing, bending, movement Relieving factors: position changes, medication  PRECAUTIONS: Fall  RED FLAGS: None   WEIGHT BEARING RESTRICTIONS: No  FALLS:  Has patient fallen in last 6 months? No    OCCUPATION: not working  PLOF: Independent  PATIENT GOALS: To regain my knee function  NEXT MD  VISIT: 06/25/23  OBJECTIVE:  Note: Objective measures were completed at Evaluation unless otherwise noted.  DIAGNOSTIC FINDINGS: CLINICAL DATA:  Status post total right knee arthroplasty. Osteomyelitis of right knee region. Septic arthritis of right knee.   EXAM: PORTABLE RIGHT KNEE - 1-2 VIEW   COMPARISON:  Right knee radiographs 04/08/2023, MRI right knee 04/09/2023   FINDINGS: Interval total right knee arthroplasty with metallic femoral component and lower density tibial component. Antibiotic beads are seen throughout the tibial tunnel common femoral tunnel, and medial and suprapatellar compartment. Moderate joint effusion. Mild subcutaneous and intra-articular air. No cortical erosion is seen.   IMPRESSION: Interval total right knee arthroplasty, presumably for resection of the prior medial greater than lateral compartment articular surface septic arthritis/osteomyelitis, with antibiotic beads in place. Moderate joint effusion.     Electronically Signed   By: Neita Garnet M.D.   On: 04/14/2023 19:25  PATIENT SURVEYS:  FOTO 54(64 predicted)  MUSCLE LENGTH: N/T  POSTURE: N/T  PALPATION: N/T  LOWER EXTREMITY ROM:  A/PROM Right eval Left eval  Hip flexion    Hip extension    Hip abduction    Hip adduction    Hip internal rotation    Hip external rotation    Knee flexion 70/82d   Knee extension -8/-4d   Ankle dorsiflexion    Ankle plantarflexion    Ankle inversion    Ankle eversion     (Blank rows = not tested)  LOWER EXTREMITY MMT:  MMT Right eval Left eval  Hip flexion 3+   Hip extension 3+   Hip abduction 3+   Hip adduction    Hip internal rotation    Hip external rotation    Knee flexion 3+   Knee extension 3   Ankle dorsiflexion    Ankle plantarflexion 3+   Ankle inversion    Ankle eversion     (Blank rows = not tested)  LOWER EXTREMITY SPECIAL TESTS: NT   FUNCTIONAL TESTS:  30 seconds chair stand test 3 reps  GAIT: Distance  walked: 144ftx2 Assistive device utilized: Walker - 2 wheeled Level of assistance: Complete Independence Comments: slow antalgic gait   TODAY'S TREATMENT:                                                                                                                              DATE: 06/22/23 Eval    PATIENT EDUCATION:  Education details: Discussed eval findings, rehab rationale and POC and patient is in agreement  Person educated: Patient Education method: Explanation Education comprehension: verbalized understanding and needs further education  HOME EXERCISE PROGRAM: Access Code: [email or web address] URL: [email or web address] Date: 06/22/2023 Prepared by: Gustavus Bryant  Exercises - Supine Knee Extension Strengthening  - 5 x daily - 5 x weekly - 1 sets - 10 reps - Supine Heel Slide with Strap  - 5 x daily - 5 x weekly - 1 sets - 10 reps - Supine Quad Set  - 5 x daily - 5 x weekly - 1 sets - 10 reps - 3s hold  ASSESSMENT:  CLINICAL IMPRESSION: Patient is a 47 y.o. female who was seen today for physical therapy evaluation and treatment for R knee pain, loss of motion and strength deficits following TKA on 04/14/23.  Due to septic arthritis which led to surgery requiring 6 week IV ABX.  Patient ambulating with RW.  30s chair stand test demonstrates LE weakness, patient hypersensitive to light touch at anterior knee only.   PROM flexion restricted to <90d.  Able to generate a fair quad set but extensor lag noted with SLR.  OBJECTIVE IMPAIRMENTS: Abnormal gait, decreased activity tolerance, decreased mobility, difficulty walking, decreased ROM, decreased strength, obesity, and pain.   ACTIVITY LIMITATIONS: carrying, lifting, sitting, standing, squatting, stairs, and locomotion level  PERSONAL FACTORS: Fitness, Past/current experiences, and 1 comorbidity: infection  are also affecting patient's functional outcome.   REHAB POTENTIAL: Good  CLINICAL DECISION MAKING:  Evolving/moderate complexity  EVALUATION COMPLEXITY: Low   GOALS: Goals reviewed with patient? No  SHORT TERM GOALS: Target date: 07/13/2023   Patient to demonstrate independence in HEP  Baseline: [email or web address] Goal status: INITIAL  2.  Increase R knee flexion to 90d Baseline: 82d Goal status: INITIAL  3.  Able to perform SLR w/o extensor lag Baseline: Extensor lag present Goal status: INITIAL   LONG TERM GOALS: Target date: 08/03/2023    Patient will score at least 64% on FOTO to signify clinically meaningful improvement in functional abilities.   Baseline: 54% Goal status: INITIAL  2.  Patient will increase 30s chair stand reps from 3 to 6 with/without arms to demonstrate and improved functional ability with less pain/difficulty as well as reduce fall risk.  Baseline: 3 Goal status: INITIAL  3.  Patient will acknowledge 6/10 pain at least once during episode of care   Baseline: 10/10 Goal status: INITIAL  4.  253ft ambulation with LRAD Baseline: 183ft with RW Goal status: INITIAL  5.  Patient to negotiate 16 steps with most appropriate pattern Baseline: TBD Goal status: INITIAL  6.  Increase R knee flexion to 110d Baseline: 82d Goal status: INITIAL   PLAN:  PT FREQUENCY: 2x/week  PT DURATION: 6 weeks  PLANNED INTERVENTIONS: 97164- PT Re-evaluation, 97110-Therapeutic exercises, 97530- Therapeutic activity, 97112- Neuromuscular re-education, 97535- Self Care, 16109- Manual therapy, 97116- Gait training, 97014- Electrical stimulation (unattended), Patient/Family education, Balance training, Stair training, Joint mobilization, Cryotherapy, and Moist heat  PLAN FOR NEXT SESSION: HEP review and update, manual techniques as appropriate, aerobic tasks, ROM and flexibility activities, strengthening and PREs, TPDN, gait and balance training as needed   For all possible CPT codes, reference the Planned Interventions line above.     Check all conditions that are  expected to impact treatment: {Conditions expected to impact treatment:Unknown   If treatment provided at initial evaluation, no treatment charged due to lack of authorization.       Hildred Laser, PT 06/23/2023, 3:49 PM

## 2023-06-22 ENCOUNTER — Ambulatory Visit: Payer: Medicaid Other | Attending: Orthopedic Surgery

## 2023-06-22 ENCOUNTER — Other Ambulatory Visit: Payer: Self-pay

## 2023-06-22 DIAGNOSIS — M6281 Muscle weakness (generalized): Secondary | ICD-10-CM | POA: Insufficient documentation

## 2023-06-22 DIAGNOSIS — Z96651 Presence of right artificial knee joint: Secondary | ICD-10-CM | POA: Diagnosis present

## 2023-06-22 DIAGNOSIS — M25561 Pain in right knee: Secondary | ICD-10-CM | POA: Insufficient documentation

## 2023-06-25 ENCOUNTER — Ambulatory Visit: Payer: Medicaid Other | Admitting: Internal Medicine

## 2023-07-07 ENCOUNTER — Ambulatory Visit: Payer: Medicaid Other

## 2023-07-07 DIAGNOSIS — Z96651 Presence of right artificial knee joint: Secondary | ICD-10-CM

## 2023-07-07 DIAGNOSIS — M6281 Muscle weakness (generalized): Secondary | ICD-10-CM

## 2023-07-07 DIAGNOSIS — M25561 Pain in right knee: Secondary | ICD-10-CM | POA: Diagnosis not present

## 2023-07-07 NOTE — Therapy (Signed)
OUTPATIENT PHYSICAL THERAPY TREATMENT NOTE   Patient Name: Sharon Faulkner MRN: [id number] DOB:03-22-1976, 47 y.o., female Today's Date: 07/07/2023  END OF SESSION:  PT End of Session - 07/07/23 1730     Visit Number 2    Number of Visits 7    Date for PT Re-Evaluation 08/22/23    Authorization Type MCD    PT Start Time 1745    PT Stop Time 1820    PT Time Calculation (min) 35 min    Activity Tolerance Patient limited by pain    Behavior During Therapy [facility] for tasks assessed/performed              Past Medical History:  Diagnosis Date   Asthma    Past Surgical History:  Procedure Laterality Date   INCISION AND DRAINAGE ABSCESS Right 04/14/2023   Procedure: INCISION AND DRAINAGE RIGHT KNEE;  Surgeon: Sheral Apley, MD;  Location: [facility];  Service: Orthopedics;  Laterality: Right;   KNEE ARTHROSCOPY Right 04/09/2023   Procedure: ARTHROSCOPY KNEE RIGHT;  Surgeon: Sheral Apley, MD;  Location: [facility] OR;  Service: Orthopedics;  Laterality: Right;   TOTAL KNEE ARTHROPLASTY Right 04/14/2023   Procedure: TOTAL KNEE ARTHROPLASTY;  Surgeon: Sheral Apley, MD;  Location: [facility];  Service: Orthopedics;  Laterality: Right;   Patient Active Problem List   Diagnosis Date Noted   Primary hypertension 05/07/2023   Concern for acute osteomyelitis of femoal condyle and tibial plateau (HCC) 04/19/2023   Obesity, Class III, BMI 40-49.9 (morbid obesity) (HCC) 04/19/2023   Asthma 04/09/2023   Acute on chronic anemia 04/09/2023   Septic arthritis of knee, 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 She reports HEP compliance. She states that she can only sleep for about 2-3 hours due to pain and stiffness in the knee and that she does not have any pain medication to help her sleep.   EVAL: Reports R knee pain following TKA due to septic arthritis.  Required 6 weeks IV ABX then oral meds. Has been working on home based exercise for ROM and strength  PERTINENT HISTORY: HPI Sharon Faulkner is a 47 y.o. 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   She presents with ongoing discomfort despite attempts at pain management. She describes the pain as an 'aching' sensation that worsens after being up all day. The pain is so severe that it disrupts her sleep, causing her to describe her leg as 'running around the bed.' The pain also affects her mobility, causing her to shift her weight onto her other leg, which in turn causes hip and back pain.   The patient has tried various pain medications, including codeine and hydrocodone, but has had adverse reactions to both. She has also tried methocarbamol which  has been ineffective.  At her last visit I had placed her on Duloxetine but she found this to be intolerable due to side effects such as nausea. She continues to follow-up with her orthopedic and she did have a visit with infectious disease on 05/2023 and still remains on her antibiotic infusion. PAIN:  Are you having pain? Yes: NPRS scale: 10+/10 Pain location: R knee Pain description: ache, throb Aggravating factors: weightbearing, bending, movement Relieving factors: position changes,  medication  PRECAUTIONS: Fall  RED FLAGS: None   WEIGHT BEARING RESTRICTIONS: No  FALLS:  Has patient fallen in last 6 months? No    OCCUPATION: not working  PLOF: Independent  PATIENT GOALS: To regain my knee function  NEXT MD VISIT: 06/25/23  OBJECTIVE:  Note: Objective measures were completed at Evaluation unless otherwise noted.  DIAGNOSTIC FINDINGS: CLINICAL DATA:  Status post total right knee arthroplasty. Osteomyelitis of right knee region. Septic arthritis of right knee.   EXAM: PORTABLE RIGHT KNEE - 1-2 VIEW   COMPARISON:  Right knee radiographs 04/08/2023, MRI right knee 04/09/2023   FINDINGS: Interval total right knee arthroplasty with metallic femoral component and lower density tibial component. Antibiotic beads are seen throughout the tibial tunnel common femoral tunnel, and medial and suprapatellar compartment. Moderate joint effusion. Mild subcutaneous and intra-articular air. No cortical erosion is seen.   IMPRESSION: Interval total right knee arthroplasty, presumably for resection of the prior medial greater than lateral compartment articular surface septic arthritis/osteomyelitis, with antibiotic beads in place. Moderate joint effusion.     Electronically Signed   By: Neita Garnet M.D.   On: 04/14/2023 19:25  PATIENT SURVEYS:  FOTO 54(64 predicted)  MUSCLE LENGTH: N/T  POSTURE: N/T  PALPATION: N/T  LOWER EXTREMITY ROM:  A/PROM Right eval Left eval Right 07/07/23  Hip flexion     Hip extension     Hip abduction     Hip adduction     Hip internal rotation     Hip external rotation     Knee flexion 70/82d  83 AAROM  Knee extension -8/-4d    Ankle dorsiflexion     Ankle plantarflexion     Ankle inversion     Ankle eversion      (Blank rows = not tested)  LOWER EXTREMITY MMT:  MMT Right eval Left eval  Hip flexion 3+   Hip extension 3+   Hip abduction 3+   Hip adduction    Hip internal rotation    Hip  external rotation    Knee flexion 3+   Knee extension 3   Ankle dorsiflexion    Ankle plantarflexion 3+   Ankle inversion    Ankle eversion     (Blank rows = not tested)  LOWER EXTREMITY SPECIAL TESTS: NT   FUNCTIONAL TESTS:  30 seconds chair stand test 3 reps  GAIT: Distance walked: 13ftx2 Assistive device utilized: Environmental consultant - 2 wheeled Level of assistance: Complete Independence Comments: slow antalgic gait   TODAY'S TREATMENT:               [facility] Adult PT Treatment:                                                DATE: 07/07/23 Therapeutic Exercise: Seated heel/toe raises 2x10 LAQ 2x10 Rt Seated heel slides - cues to decrease trunk lean onto Lt  hip SAQ x10, x5 SLR x5  Supine knee flexion with strap x10 STS 2x5                                                                                                                  DATE: 06/22/23 Eval    PATIENT EDUCATION:  Education details: Discussed eval findings, rehab rationale and POC and patient is in agreement  Person educated: Patient Education method: Explanation Education comprehension: verbalized understanding and needs further education  HOME EXERCISE PROGRAM: Access Code: [email or web address] URL: [email or web address] Date: 06/22/2023 Prepared by: Gustavus Bryant  Exercises - Supine Knee Extension Strengthening  - 5 x daily - 5 x weekly - 1 sets - 10 reps - Supine Heel Slide with Strap  - 5 x daily - 5 x weekly - 1 sets - 10 reps - Supine Quad Set  - 5 x daily - 5 x weekly - 1 sets - 10 reps - 3s hold  ASSESSMENT:  CLINICAL IMPRESSION: Patient presents to PT reporting increased pain in her Rt knee and that she has been compliant with HEP. She is limited by pain throughout session, wanting to avoid machines and standing exercises today due to pain. She requires frequent cues for breathing during exercises. Session today focused on knee ROM and quad strengthening as able within her pain tolerance. Patient  continues to benefit from skilled PT services and should be progressed as able to improve functional independence.     OBJECTIVE IMPAIRMENTS: Abnormal gait, decreased activity tolerance, decreased mobility, difficulty walking, decreased ROM, decreased strength, obesity, and pain.   ACTIVITY LIMITATIONS: carrying, lifting, sitting, standing, squatting, stairs, and locomotion level  PERSONAL FACTORS: Fitness, Past/current experiences, and 1 comorbidity: infection  are also affecting patient's functional outcome.   REHAB POTENTIAL: Good  CLINICAL DECISION MAKING: Evolving/moderate complexity  EVALUATION COMPLEXITY: Low   GOALS: Goals reviewed with patient? No  SHORT TERM GOALS: Target date: 07/13/2023   Patient to demonstrate independence in HEP  Baseline: [email or web address] Goal status: INITIAL  2.  Increase R knee flexion to 90d Baseline: 82d Goal status: INITIAL  3.  Able to perform SLR w/o extensor lag Baseline: Extensor lag present Goal status: INITIAL   LONG TERM GOALS: Target date: 08/03/2023    Patient will score at least 64% on FOTO to signify clinically meaningful improvement in functional abilities.   Baseline: 54% Goal status: INITIAL  2.  Patient will increase 30s chair stand reps from 3 to 6 with/without arms to demonstrate and improved functional ability with less pain/difficulty as well as reduce fall risk.  Baseline: 3 Goal status: INITIAL  3.  Patient will acknowledge 6/10 pain at least once during episode of care   Baseline: 10/10 Goal status: INITIAL  4.  281ft ambulation with LRAD Baseline: 174ft with RW Goal status: INITIAL  5.  Patient to negotiate 16 steps with most appropriate pattern Baseline: TBD Goal status: INITIAL  6.  Increase R knee flexion to 110d Baseline: 82d Goal status: INITIAL   PLAN:  PT  FREQUENCY: 2x/week  PT DURATION: 6 weeks  PLANNED INTERVENTIONS: 97164- PT Re-evaluation, 97110-Therapeutic exercises, 97530- Therapeutic  activity, 97112- Neuromuscular re-education, 97535- Self Care, 16109- Manual therapy, [phone]- Gait training, 97014- Electrical stimulation (unattended), Patient/Family education, Balance training, Stair training, Joint mobilization, Cryotherapy, and Moist heat  PLAN FOR NEXT SESSION: HEP review and update, manual techniques as appropriate, aerobic tasks, ROM and flexibility activities, strengthening and PREs, TPDN, gait and balance training as needed   For all possible CPT codes, reference the Planned Interventions line above.     Check all conditions that are expected to impact treatment: {Conditions expected to impact treatment:Unknown   If treatment provided at initial evaluation, no treatment charged due to lack of authorization.       Berta Minor, PTA 07/07/2023, 6:22 PM

## 2023-07-12 NOTE — Therapy (Unsigned)
OUTPATIENT PHYSICAL THERAPY TREATMENT NOTE   Patient Name: Sharon Faulkner MRN: [id number] DOB:09-Oct-1975, 47 y.o., female Today's Date: 07/13/2023  END OF SESSION:  PT End of Session - 07/13/23 1656     Visit Number 3    Number of Visits 7    Date for PT Re-Evaluation 08/22/23    Authorization Type MCD    PT Start Time 1655    PT Stop Time 1735    PT Time Calculation (min) 40 min    Activity Tolerance Patient limited by pain    Behavior During Therapy [facility] for tasks assessed/performed               Past Medical History:  Diagnosis Date   Asthma    Past Surgical History:  Procedure Laterality Date   INCISION AND DRAINAGE ABSCESS Right 04/14/2023   Procedure: INCISION AND DRAINAGE RIGHT KNEE;  Surgeon: Sheral Apley, MD;  Location: [facility];  Service: Orthopedics;  Laterality: Right;   KNEE ARTHROSCOPY Right 04/09/2023   Procedure: ARTHROSCOPY KNEE RIGHT;  Surgeon: Sheral Apley, MD;  Location: [facility] OR;  Service: Orthopedics;  Laterality: Right;   TOTAL KNEE ARTHROPLASTY Right 04/14/2023   Procedure: TOTAL KNEE ARTHROPLASTY;  Surgeon: Sheral Apley, MD;  Location: [facility];  Service: Orthopedics;  Laterality: Right;   Patient Active Problem List   Diagnosis Date Noted   Primary hypertension 05/07/2023   Concern for acute osteomyelitis of femoal condyle and tibial plateau (HCC) 04/19/2023   Obesity, Class III, BMI 40-49.9 (morbid obesity) (HCC) 04/19/2023   Asthma 04/09/2023   Acute on chronic anemia 04/09/2023   Septic arthritis of knee, 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 Had an ortho f/u earlier in the day.  Has Dc'ed her walker on her own accord.  Issued pain medication refill at MD appointment.   EVAL: Reports R knee pain following TKA due to septic arthritis.  Required 6 weeks IV ABX then oral meds. Has been working on home based exercise for ROM and strength  PERTINENT HISTORY: HPI Sharon Faulkner is a 47 y.o. 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   She presents with ongoing discomfort despite attempts at pain management. She describes the pain as an 'aching' sensation that worsens after being up all day. The pain is so severe that it disrupts her sleep, causing her to describe her leg as 'running around the bed.' The pain also affects her mobility, causing her to shift her weight onto her other leg, which in turn causes hip and back pain.   The patient has tried various pain medications, including codeine and hydrocodone, but has had adverse reactions to both. She has also tried methocarbamol which has been ineffective.  At her last visit I had  placed her on Duloxetine but she found this to be intolerable due to side effects such as nausea. She continues to follow-up with her orthopedic and she did have a visit with infectious disease on 05/2023 and still remains on her antibiotic infusion. PAIN:  Are you having pain? Yes: NPRS scale: 10+/10 Pain location: R knee Pain description: ache, throb Aggravating factors: weightbearing, bending, movement Relieving factors: position changes, medication  PRECAUTIONS: Fall  RED FLAGS: None   WEIGHT BEARING  RESTRICTIONS: No  FALLS:  Has patient fallen in last 6 months? No    OCCUPATION: not working  PLOF: Independent  PATIENT GOALS: To regain my knee function  NEXT MD VISIT: 06/25/23  OBJECTIVE:  Note: Objective measures were completed at Evaluation unless otherwise noted.  DIAGNOSTIC FINDINGS: CLINICAL DATA:  Status post total right knee arthroplasty. Osteomyelitis of right knee region. Septic arthritis of right knee.   EXAM: PORTABLE RIGHT KNEE - 1-2 VIEW   COMPARISON:  Right knee radiographs 04/08/2023, MRI right knee 04/09/2023   FINDINGS: Interval total right knee arthroplasty with metallic femoral component and lower density tibial component. Antibiotic beads are seen throughout the tibial tunnel common femoral tunnel, and medial and suprapatellar compartment. Moderate joint effusion. Mild subcutaneous and intra-articular air. No cortical erosion is seen.   IMPRESSION: Interval total right knee arthroplasty, presumably for resection of the prior medial greater than lateral compartment articular surface septic arthritis/osteomyelitis, with antibiotic beads in place. Moderate joint effusion.     Electronically Signed   By: Neita Garnet M.D.   On: 04/14/2023 19:25  PATIENT SURVEYS:  FOTO 54(64 predicted)  MUSCLE LENGTH: N/T  POSTURE: N/T  PALPATION: N/T  LOWER EXTREMITY ROM:  A/PROM Right eval Left eval Right 07/07/23  Hip flexion     Hip extension     Hip abduction     Hip adduction     Hip internal rotation     Hip external rotation     Knee flexion 70/82d  83 AAROM  Knee extension -8/-4d    Ankle dorsiflexion     Ankle plantarflexion     Ankle inversion     Ankle eversion      (Blank rows = not tested)  LOWER EXTREMITY MMT:  MMT Right eval Left eval  Hip flexion 3+   Hip extension 3+   Hip abduction 3+   Hip adduction    Hip internal rotation    Hip external rotation    Knee flexion 3+   Knee extension 3   Ankle  dorsiflexion    Ankle plantarflexion 3+   Ankle inversion    Ankle eversion     (Blank rows = not tested)  LOWER EXTREMITY SPECIAL TESTS: NT   FUNCTIONAL TESTS:  30 seconds chair stand test 3 reps  GAIT: Distance walked: 164ftx2 Assistive device utilized: Environmental consultant - 2 wheeled Level of assistance: Complete Independence Comments: slow antalgic gait   TODAY'S TREATMENT:          [facility] Adult PT Treatment:                                                DATE: 07/13/23 Therapeutic Exercise: Nustep L1 6 min R QS 3s 15x R SLR 15x 2s R SAQs 15x 2s Heel slides over board w/strap 15x FAQ over towel roll 15x 2s TKE GTB 15x Standing heel  raises 15x       [facility] Adult PT Treatment:                                                DATE: 07/07/23 Therapeutic Exercise: Seated heel/toe raises 2x10 LAQ 2x10 Rt Seated heel slides - cues to decrease trunk lean onto Lt hip SAQ x10, x5 SLR x5  Supine knee flexion with strap x10 STS 2x5                                                                                                                  DATE: 06/22/23 Eval    PATIENT EDUCATION:  Education details: Discussed eval findings, rehab rationale and POC and patient is in agreement  Person educated: Patient Education method: Explanation Education comprehension: verbalized understanding and needs further education  HOME EXERCISE PROGRAM: Access Code: [email or web address] URL: [email or web address] Date: 06/22/2023 Prepared by: Gustavus Bryant  Exercises - Supine Knee Extension Strengthening  - 5 x daily - 5 x weekly - 1 sets - 10 reps - Supine Heel Slide with Strap  - 5 x daily - 5 x weekly - 1 sets - 10 reps - Supine Quad Set  - 5 x daily - 5 x weekly - 1 sets - 10 reps - 3s hold  ASSESSMENT:  CLINICAL IMPRESSION: Continued high levels of pain.  Focus of today was ROM and R quad activation.  Able to flex to 95d with strap and demonstrate full TKE.    OBJECTIVE IMPAIRMENTS:  Abnormal gait, decreased activity tolerance, decreased mobility, difficulty walking, decreased ROM, decreased strength, obesity, and pain.   ACTIVITY LIMITATIONS: carrying, lifting, sitting, standing, squatting, stairs, and locomotion level  PERSONAL FACTORS: Fitness, Past/current experiences, and 1 comorbidity: infection  are also affecting patient's functional outcome.   REHAB POTENTIAL: Good  CLINICAL DECISION MAKING: Evolving/moderate complexity  EVALUATION COMPLEXITY: Low   GOALS: Goals reviewed with patient? No  SHORT TERM GOALS: Target date: 07/13/2023   Patient to demonstrate independence in HEP  Baseline: [email or web address] Goal status: INITIAL  2.  Increase R knee flexion to 90d Baseline: 82d; 07/13/23 95d Goal status: Met  3.  Able to perform SLR w/o extensor lag Baseline: Extensor lag present Goal status: Ongoing   LONG TERM GOALS: Target date: 08/03/2023    Patient will score at least 64% on FOTO to signify clinically meaningful improvement in functional abilities.   Baseline: 54% Goal status: INITIAL  2.  Patient will increase 30s chair stand reps from 3 to 6 with/without arms to demonstrate and improved functional ability with less pain/difficulty as well as reduce fall risk.  Baseline: 3 Goal status: INITIAL  3.  Patient will acknowledge 6/10 pain at least once during episode of care   Baseline: 10/10 Goal status: INITIAL  4.  234ft ambulation with LRAD Baseline: 157ft with RW Goal status: INITIAL  5.  Patient to  negotiate 16 steps with most appropriate pattern Baseline: TBD Goal status: INITIAL  6.  Increase R knee flexion to 110d Baseline: 82d Goal status: INITIAL   PLAN:  PT FREQUENCY: 2x/week  PT DURATION: 6 weeks  PLANNED INTERVENTIONS: 97164- PT Re-evaluation, 97110-Therapeutic exercises, 97530- Therapeutic activity, 97112- Neuromuscular re-education, 97535- Self Care, 74259- Manual therapy, 97116- Gait training, 97014- Electrical stimulation  (unattended), Patient/Family education, Balance training, Stair training, Joint mobilization, Cryotherapy, and Moist heat  PLAN FOR NEXT SESSION: HEP review and update, manual techniques as appropriate, aerobic tasks, ROM and flexibility activities, strengthening and PREs, TPDN, gait and balance training as needed   For all possible CPT codes, reference the Planned Interventions line above.     Check all conditions that are expected to impact treatment: {Conditions expected to impact treatment:Unknown   If treatment provided at initial evaluation, no treatment charged due to lack of authorization.       Hildred Laser, PT 07/13/2023, 5:35 PM

## 2023-07-13 ENCOUNTER — Other Ambulatory Visit: Payer: Self-pay

## 2023-07-13 ENCOUNTER — Ambulatory Visit: Payer: Medicaid Other | Attending: Orthopedic Surgery

## 2023-07-13 ENCOUNTER — Ambulatory Visit: Payer: Medicaid Other | Admitting: Internal Medicine

## 2023-07-13 ENCOUNTER — Encounter: Payer: Self-pay | Admitting: Internal Medicine

## 2023-07-13 VITALS — BP 163/89 | HR 81 | Temp 97.8°F

## 2023-07-13 DIAGNOSIS — M6281 Muscle weakness (generalized): Secondary | ICD-10-CM | POA: Insufficient documentation

## 2023-07-13 DIAGNOSIS — Z79899 Other long term (current) drug therapy: Secondary | ICD-10-CM | POA: Diagnosis not present

## 2023-07-13 DIAGNOSIS — Z96651 Presence of right artificial knee joint: Secondary | ICD-10-CM | POA: Insufficient documentation

## 2023-07-13 DIAGNOSIS — B372 Candidiasis of skin and nail: Secondary | ICD-10-CM | POA: Diagnosis not present

## 2023-07-13 DIAGNOSIS — M00261 Other streptococcal arthritis, right knee: Secondary | ICD-10-CM | POA: Diagnosis present

## 2023-07-13 DIAGNOSIS — M25561 Pain in right knee: Secondary | ICD-10-CM | POA: Diagnosis present

## 2023-07-13 MED ORDER — FLUCONAZOLE 150 MG PO TABS: 150.0000 mg | ORAL_TABLET | Freq: Every day | ORAL | 0 refills | Status: DC

## 2023-07-14 NOTE — Progress Notes (Signed)
Patient ID: Sharon Faulkner, female   DOB: 05-13-1976, 47 y.o.   MRN: [id number]  HPI The patient, with a history of knee infection, presents for follow-up. They report persistent knee pain despite ongoing treatment and physical therapy. The patient also notes some residual swelling in the knee, which has been causing difficulty in wearing regular shoes.  The patient has been on a course of antibiotics, which they are tolerating well, with no known side effects. However, they did report some discharge, possibly indicative of a yeast infection.  The patient's knee infection has been showing signs of improvement, as evidenced by a decrease in their sed rate from 100 to 33 over the past two months. Despite this progress, the patient is still required to continue their antibiotic regimen for another three months.  The patient also mentions an unusual sensation in their knee at night, describing it as "jumping around." Despite these ongoing issues, the patient remains engaged in their treatment plan, attending multiple appointments in one day and expressing a commitment to continue with their prescribed therapies.  Outpatient Encounter Medications as of 07/13/2023  Medication Sig   acetaminophen (TYLENOL) 500 MG tablet Take 2 tablets (1,000 mg total) by mouth in the morning, at noon, and at bedtime.   albuterol (VENTOLIN HFA) 108 (90 Base) MCG/ACT inhaler Inhale 2 puffs into the lungs every 6 (six) hours as needed for wheezing or shortness of breath.   amLODipine (NORVASC) 5 MG tablet Take 1 tablet (5 mg total) by mouth daily.   aspirin EC 81 MG tablet Take 1 tablet (81 mg total) by mouth 2 (two) times daily. To prevent blood clots for 30 days after surgery.   cefadroxil (DURICEF) 500 MG capsule Take 2 capsules (1,000 mg total) by mouth 2 (two) times daily.   ferrous sulfate 325 (65 FE) MG tablet Take 1 tablet (325 mg total) by mouth every other day.   fluconazole (DIFLUCAN) 150 MG tablet Take  1 tablet (150 mg total) by mouth daily. If needed every 3-5 days if yeast infection   methocarbamol (ROBAXIN) 500 MG tablet Take 1 tablet (500 mg total) by mouth every 6 (six) hours as needed for muscle spasms.   naproxen sodium (ALEVE) 220 MG tablet Take 1 tablet (220 mg total) by mouth 2 (two) times daily with a meal.   No facility-administered encounter medications on file as of 07/13/2023.     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 appears well-developed and well-nourished. No distress.  HENT: [place]/AT, PERRLA, no scleral icterus Mouth/Throat: Oropharynx is clear and moist. No oropharyngeal exudate.  Cardiovascular: Normal rate, regular rhythm and normal heart sounds. Exam reveals no gallop and no friction rub.  No murmur heard.  Pulmonary/Chest: Effort normal and breath sounds normal. No respiratory distress.  has no wheezes.  Neck = supple, no nuchal rigidity VFI:EPPIR knee incision well healed. +trace pitting edema Lymphadenopathy: no cervical adenopathy. No axillary adenopathy Neurological: alert and  oriented to person, place, and time.  Skin: Skin is warm and dry. No rash noted. No erythema.  Psychiatric: a normal mood and affect.  behavior is normal.   LABS Sed rate: 33 (05/13/2023) Sed rate: 100 (03/13/2023)  CBC Lab Results  Component Value Date   WBC 8.7 05/21/2023   RBC 4.18 05/21/2023   HGB 9.3 (L) 05/21/2023   HCT 32.1 (L) 05/21/2023   PLT 378 05/21/2023   MCV 76.8 (L) 05/21/2023   MCH 22.2 (L) 05/21/2023   MCHC 29.0 (L) 05/21/2023   RDW 19.8 (H) 05/21/2023   LYMPHSABS 1,531 05/21/2023   MONOABS 0.7 04/19/2023   EOSABS 531 (H) 05/21/2023    BMET Lab Results  Component Value Date   NA 140 05/21/2023   K 4.2 05/21/2023   CL 104 05/21/2023   CO2 27 05/21/2023   GLUCOSE 100 (H) 05/21/2023   BUN 8 05/21/2023   CREATININE 0.54 05/21/2023   CALCIUM 8.9 05/21/2023   GFRNONAA >60 04/19/2023      Assessment and Plan  Knee Infection Three months post-infection with ongoing treatment. Knee shows some swelling and soreness, but no significant increase. Currently on cefadroxil with improvement in inflammatory markers (sed rate decreased from 100 to 33). Normal sed rate is around 20, indicating progress. Patient prefers to continue current treatment plan. - Continue cefadroxil for three more months - Refer to pain specialist - Continue physical therapy - Follow up in six weeks with lab work to monitor inflammatory markers -- long term use of medicaiton/antibiotics  Yeast Infection Symptoms of discharge and itchiness, likely secondary to antibiotic use. Discussed fluconazole use and symptoms to monitor. - Prescribe fluconazole, one tablet every three to five days if symptoms persist  Pain management  -recommend to see pain management specialist. If taking ibuprofen to take on full stomach; not to use in excess  Follow-up - Schedule follow-up appointment in six weeks - Perform lab work at follow-up to check inflammatory markers.

## 2023-07-20 ENCOUNTER — Ambulatory Visit: Payer: Medicaid Other

## 2023-07-20 DIAGNOSIS — Z96651 Presence of right artificial knee joint: Secondary | ICD-10-CM

## 2023-07-20 DIAGNOSIS — M6281 Muscle weakness (generalized): Secondary | ICD-10-CM

## 2023-07-20 DIAGNOSIS — M25561 Pain in right knee: Secondary | ICD-10-CM | POA: Diagnosis not present

## 2023-07-20 NOTE — Therapy (Signed)
OUTPATIENT PHYSICAL THERAPY TREATMENT NOTE   Patient Name: Sharon Faulkner MRN: [id number] DOB:[birth year], 47 y.o., female Today's Date: 07/20/2023  END OF SESSION:  PT End of Session - 07/20/23 1642     Visit Number 4    Number of Visits 7    Date for PT Re-Evaluation 08/22/23    Authorization Type MCD    PT Start Time 1642    PT Stop Time 1720    PT Time Calculation (min) 38 min    Activity Tolerance Patient limited by pain    Behavior During Therapy [facility] for tasks assessed/performed             Past Medical History:  Diagnosis Date   Asthma    Past Surgical History:  Procedure Laterality Date   INCISION AND DRAINAGE ABSCESS Right 04/14/2023   Procedure: INCISION AND DRAINAGE RIGHT KNEE;  Surgeon: Sheral Apley, MD;  Location: [facility];  Service: Orthopedics;  Laterality: Right;   KNEE ARTHROSCOPY Right 04/09/2023   Procedure: ARTHROSCOPY KNEE RIGHT;  Surgeon: Sheral Apley, MD;  Location: [facility] OR;  Service: Orthopedics;  Laterality: Right;   TOTAL KNEE ARTHROPLASTY Right 04/14/2023   Procedure: TOTAL KNEE ARTHROPLASTY;  Surgeon: Sheral Apley, MD;  Location: [facility];  Service: Orthopedics;  Laterality: Right;   Patient Active Problem List   Diagnosis Date Noted   Primary hypertension 05/07/2023   Concern for acute osteomyelitis of femoal condyle and tibial plateau (HCC) 04/19/2023   Obesity, Class III, BMI 40-49.9 (morbid obesity) (HCC) 04/19/2023   Asthma 04/09/2023   Acute on chronic anemia 04/09/2023   Septic arthritis of knee, 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  EVAL: Reports R knee pain following TKA due to septic arthritis.  Required 6 weeks IV ABX then oral meds. Has been working on home based exercise for ROM and strength  PERTINENT HISTORY: HPI Sharon Faulkner is a 47 y.o. 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   She presents with ongoing discomfort despite attempts at pain management. She describes the pain as an 'aching' sensation that worsens after being up all day. The pain is so severe that it disrupts her sleep, causing her to describe her leg as 'running around the bed.' The pain also affects her mobility, causing her to shift her weight onto her other leg, which in turn causes hip and back pain.   The patient has tried various pain medications, including codeine and hydrocodone, but has had adverse reactions to both. She has also tried methocarbamol which has been ineffective.  At her last visit I had placed her on Duloxetine but she found this to be intolerable due to side effects  such as nausea. She continues to follow-up with her orthopedic and she did have a visit with infectious disease on 05/2023 and still remains on her antibiotic infusion. PAIN:  Are you having pain? Yes: NPRS scale: 10+/10 Pain location: R knee Pain description: ache, throb Aggravating factors: weightbearing, bending, movement Relieving factors: position changes, medication  PRECAUTIONS: Fall  RED FLAGS: None   WEIGHT BEARING RESTRICTIONS: No  FALLS:  Has patient fallen in last 6 months?  No    OCCUPATION: not working  PLOF: Independent  PATIENT GOALS: To regain my knee function  NEXT MD VISIT: 06/25/23  OBJECTIVE:  Note: Objective measures were completed at Evaluation unless otherwise noted.  DIAGNOSTIC FINDINGS: CLINICAL DATA:  Status post total right knee arthroplasty. Osteomyelitis of right knee region. Septic arthritis of right knee.   EXAM: PORTABLE RIGHT KNEE - 1-2 VIEW   COMPARISON:  Right knee radiographs 04/08/2023, MRI right knee 04/09/2023   FINDINGS: Interval total right knee arthroplasty with metallic femoral component and lower density tibial component. Antibiotic beads are seen throughout the tibial tunnel common femoral tunnel, and medial and suprapatellar compartment. Moderate joint effusion. Mild subcutaneous and intra-articular air. No cortical erosion is seen.   IMPRESSION: Interval total right knee arthroplasty, presumably for resection of the prior medial greater than lateral compartment articular surface septic arthritis/osteomyelitis, with antibiotic beads in place. Moderate joint effusion.     Electronically Signed   By: Neita Garnet M.D.   On: 04/14/2023 19:25  PATIENT SURVEYS:  FOTO 54(64 predicted)  MUSCLE LENGTH: N/T  POSTURE: N/T  PALPATION: N/T  LOWER EXTREMITY ROM:  A/PROM Right eval Left eval Right 07/07/23 Right 07/20/23  Hip flexion      Hip extension      Hip abduction      Hip adduction      Hip internal rotation      Hip external rotation      Knee flexion 70/82d  83 AAROM 94 AAROM  Knee extension -8/-4d     Ankle dorsiflexion      Ankle plantarflexion      Ankle inversion      Ankle eversion       (Blank rows = not tested)  LOWER EXTREMITY MMT:  MMT Right eval Left eval  Hip flexion 3+   Hip extension 3+   Hip abduction 3+   Hip adduction    Hip internal rotation    Hip external rotation    Knee flexion 3+   Knee extension 3   Ankle dorsiflexion    Ankle plantarflexion 3+    Ankle inversion    Ankle eversion     (Blank rows = not tested)  LOWER EXTREMITY SPECIAL TESTS: NT   FUNCTIONAL TESTS:  30 seconds chair stand test 3 reps  GAIT: Distance walked: 168ftx2 Assistive device utilized: Environmental consultant - 2 wheeled Level of assistance: Complete Independence Comments: slow antalgic gait   TODAY'S TREATMENT:  [facility] Adult PT Treatment:                                                DATE: 07/20/23 Therapeutic Exercise: Nustep L1 6 min R QS 3s 15x R SLR 15x 2s R SAQs 15x 2s Heel slides over board w/strap 15x FAQ over towel roll 15x 2s Standing heel raises 2x10 Manual Therapy: PROM flexion with distraction sitting EOM Rt  [facility] Adult PT Treatment:                                                DATE: 07/13/23 Therapeutic Exercise: Nustep L1 6 min R QS 3s 15x R SLR 15x 2s R SAQs 15x 2s Heel slides over board w/strap 15x FAQ over towel roll 15x 2s TKE GTB 15x Standing heel raises 15x       [facility] Adult PT Treatment:                                                DATE: 07/07/23 Therapeutic Exercise: Seated heel/toe raises 2x10 LAQ 2x10 Rt Seated heel slides - cues to decrease trunk lean onto Lt hip SAQ x10, x5 SLR x5  Supine knee flexion with strap x10 STS 2x5                                                                                                                  PATIENT EDUCATION:  Education details: Discussed eval findings, rehab rationale and POC and patient is in agreement  Person educated: Patient Education method: Explanation Education comprehension: verbalized understanding and needs further education  HOME EXERCISE PROGRAM: Access Code: [email or web address] URL: [email or web address] Date: 06/22/2023 Prepared by: Gustavus Bryant  Exercises - Supine Knee Extension Strengthening  - 5 x daily - 5 x weekly - 1 sets - 10 reps - Supine Heel Slide with Strap  - 5 x daily - 5 x weekly - 1 sets - 10 reps - Supine Quad Set  - 5 x daily  - 5 x weekly - 1 sets - 10 reps - 3s hold  ASSESSMENT:  CLINICAL IMPRESSION:  Patient reports continued high levels of pain in her Rt knee and that she has been compliant with her HEP. Session today continued to focus on Rt knee ROM, particularly into flexion, and quad activation. Able to achieve 94 flexion with strap today. Patient continues to benefit from skilled PT services and should be progressed as able to improve functional independence.    OBJECTIVE IMPAIRMENTS: Abnormal gait, decreased activity tolerance, decreased mobility, difficulty walking, decreased ROM, decreased strength, obesity, and pain.   ACTIVITY LIMITATIONS: carrying, lifting, sitting, standing, squatting, stairs, and locomotion level  PERSONAL FACTORS: Fitness, Past/current experiences, and 1 comorbidity: infection  are also affecting patient's functional outcome.   REHAB POTENTIAL: Good  CLINICAL DECISION MAKING: Evolving/moderate complexity  EVALUATION COMPLEXITY: Low   GOALS: Goals reviewed with patient? No  SHORT TERM GOALS: Target date: 07/13/2023   Patient to demonstrate independence in HEP  Baseline: [email or web address] Goal status: INITIAL  2.  Increase R knee flexion to 90d Baseline: 82d; 07/13/23 95d Goal status: Met  3.  Able to perform SLR w/o extensor lag Baseline: Extensor  lag present Goal status: Ongoing   LONG TERM GOALS: Target date: 08/03/2023    Patient will score at least 64% on FOTO to signify clinically meaningful improvement in functional abilities.   Baseline: 54% Goal status: INITIAL  2.  Patient will increase 30s chair stand reps from 3 to 6 with/without arms to demonstrate and improved functional ability with less pain/difficulty as well as reduce fall risk.  Baseline: 3 Goal status: INITIAL  3.  Patient will acknowledge 6/10 pain at least once during episode of care   Baseline: 10/10 Goal status: INITIAL  4.  269ft ambulation with LRAD Baseline: 193ft with RW Goal status:  INITIAL  5.  Patient to negotiate 16 steps with most appropriate pattern Baseline: TBD Goal status: INITIAL  6.  Increase R knee flexion to 110d Baseline: 82d Goal status: INITIAL   PLAN:  PT FREQUENCY: 2x/week  PT DURATION: 6 weeks  PLANNED INTERVENTIONS: 97164- PT Re-evaluation, 97110-Therapeutic exercises, 97530- Therapeutic activity, 97112- Neuromuscular re-education, 97535- Self Care, 16109- Manual therapy, 97116- Gait training, 97014- Electrical stimulation (unattended), Patient/Family education, Balance training, Stair training, Joint mobilization, Cryotherapy, and Moist heat  PLAN FOR NEXT SESSION: HEP review and update, manual techniques as appropriate, aerobic tasks, ROM and flexibility activities, strengthening and PREs, TPDN, gait and balance training as needed   For all possible CPT codes, reference the Planned Interventions line above.     Check all conditions that are expected to impact treatment: {Conditions expected to impact treatment:Unknown   If treatment provided at initial evaluation, no treatment charged due to lack of authorization.       Berta Minor, PTA 07/20/2023, 5:23 PM

## 2023-07-26 NOTE — Therapy (Unsigned)
OUTPATIENT PHYSICAL THERAPY TREATMENT NOTE   Patient Name: Sharon Faulkner MRN: [id number] DOB:12/31/1975, 47 y.o., female Today's Date: 07/26/2023  END OF SESSION:    Past Medical History:  Diagnosis Date   Asthma    Past Surgical History:  Procedure Laterality Date   INCISION AND DRAINAGE ABSCESS Right 04/14/2023   Procedure: INCISION AND DRAINAGE RIGHT KNEE;  Surgeon: Sheral Apley, MD;  Location: [facility];  Service: Orthopedics;  Laterality: Right;   KNEE ARTHROSCOPY Right 04/09/2023   Procedure: ARTHROSCOPY KNEE RIGHT;  Surgeon: Sheral Apley, MD;  Location: [facility] OR;  Service: Orthopedics;  Laterality: Right;   TOTAL KNEE ARTHROPLASTY Right 04/14/2023   Procedure: TOTAL KNEE ARTHROPLASTY;  Surgeon: Sheral Apley, MD;  Location: [facility];  Service: Orthopedics;  Laterality: Right;   Patient Active Problem List   Diagnosis Date Noted   Primary hypertension 05/07/2023   Concern for acute osteomyelitis of femoal condyle and tibial plateau (HCC) 04/19/2023   Obesity, Class III, BMI 40-49.9 (morbid obesity) (HCC) 04/19/2023   Asthma 04/09/2023   Acute on chronic anemia 04/09/2023   Septic arthritis of knee, 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  Rationale for Evaluation and Treatment: Rehabilitation  ONSET DATE: 04/15/23  SUBJECTIVE:   SUBJECTIVE STATEMENT: Patient reports continued pain in her Rt knee and that it continues to feel like a burning sensation and very tight.  EVAL: Reports R knee pain following TKA due to septic arthritis.  Required 6 weeks IV ABX then oral meds. Has been working on home based exercise for ROM and strength  PERTINENT HISTORY: HPI Sharon Faulkner is a 47 y.o. 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   She presents with ongoing discomfort despite attempts at pain management. She describes the pain as an 'aching' sensation that worsens after being up all day. The pain is so severe that it disrupts her sleep, causing her to describe her leg as 'running around the bed.' The pain also affects her mobility, causing her to shift her weight onto her other leg, which in turn causes hip and back pain.   The patient has tried various pain medications, including codeine and hydrocodone, but has had adverse reactions to both. She has also tried methocarbamol which has been ineffective.  At her last visit I had placed her on Duloxetine but she found this to be intolerable due to side effects such as nausea. She continues to follow-up with her orthopedic and she did have a visit with infectious disease on 05/2023 and still remains on her antibiotic infusion. PAIN:  Are you having pain? Yes: NPRS scale: 10+/10 Pain location: R knee Pain description: ache, throb Aggravating factors: weightbearing, bending, movement Relieving factors: position changes, medication  PRECAUTIONS: Fall  RED FLAGS: None   WEIGHT BEARING RESTRICTIONS: No  FALLS:  Has patient fallen in last 6 months? No    OCCUPATION: not working  PLOF: Independent  PATIENT GOALS: To regain my knee function  NEXT MD  VISIT: 06/25/23  OBJECTIVE:  Note: Objective measures were completed at Evaluation unless otherwise noted.  DIAGNOSTIC FINDINGS: CLINICAL DATA:  Status post total right knee arthroplasty. Osteomyelitis of right knee region. Septic arthritis of right knee.   EXAM: PORTABLE RIGHT KNEE - 1-2 VIEW   COMPARISON:  Right  knee radiographs 04/08/2023, MRI right knee 04/09/2023   FINDINGS: Interval total right knee arthroplasty with metallic femoral component and lower density tibial component. Antibiotic beads are seen throughout the tibial tunnel common femoral tunnel, and medial and suprapatellar compartment. Moderate joint effusion. Mild subcutaneous and intra-articular air. No cortical erosion is seen.   IMPRESSION: Interval total right knee arthroplasty, presumably for resection of the prior medial greater than lateral compartment articular surface septic arthritis/osteomyelitis, with antibiotic beads in place. Moderate joint effusion.     Electronically Signed   By: Neita Garnet M.D.   On: 04/14/2023 19:25  PATIENT SURVEYS:  FOTO 54(64 predicted)  MUSCLE LENGTH: N/T  POSTURE: N/T  PALPATION: N/T  LOWER EXTREMITY ROM:  A/PROM Right eval Left eval Right 07/07/23 Right 07/20/23  Hip flexion      Hip extension      Hip abduction      Hip adduction      Hip internal rotation      Hip external rotation      Knee flexion 70/82d  83 AAROM 94 AAROM  Knee extension -8/-4d     Ankle dorsiflexion      Ankle plantarflexion      Ankle inversion      Ankle eversion       (Blank rows = not tested)  LOWER EXTREMITY MMT:  MMT Right eval Left eval  Hip flexion 3+   Hip extension 3+   Hip abduction 3+   Hip adduction    Hip internal rotation    Hip external rotation    Knee flexion 3+   Knee extension 3   Ankle dorsiflexion    Ankle plantarflexion 3+   Ankle inversion    Ankle eversion     (Blank rows = not tested)  LOWER EXTREMITY SPECIAL TESTS: NT   FUNCTIONAL TESTS:  30 seconds chair stand test 3 reps  GAIT: Distance walked: 19ftx2 Assistive device utilized: Environmental consultant - 2 wheeled Level of assistance: Complete Independence Comments: slow antalgic gait   TODAY'S TREATMENT:  [facility] Adult PT Treatment:                                                DATE:  07/20/23 Therapeutic Exercise: Nustep L1 6 min R QS 3s 15x R SLR 15x 2s R SAQs 15x 2s Heel slides over board w/strap 15x FAQ over towel roll 15x 2s Standing heel raises 2x10 Manual Therapy: PROM flexion with distraction sitting EOM Rt   [facility] Adult PT Treatment:                                                DATE: 07/13/23 Therapeutic Exercise: Nustep L1 6 min R QS 3s 15x R SLR 15x 2s R SAQs 15x 2s Heel slides over board w/strap 15x FAQ over towel roll 15x 2s TKE GTB 15x Standing heel raises 15x       [facility] Adult  PT Treatment:                                                DATE: 07/07/23 Therapeutic Exercise: Seated heel/toe raises 2x10 LAQ 2x10 Rt Seated heel slides - cues to decrease trunk lean onto Lt hip SAQ x10, x5 SLR x5  Supine knee flexion with strap x10 STS 2x5                                                                                                                  PATIENT EDUCATION:  Education details: Discussed eval findings, rehab rationale and POC and patient is in agreement  Person educated: Patient Education method: Explanation Education comprehension: verbalized understanding and needs further education  HOME EXERCISE PROGRAM: Access Code: [email or web address] URL: [email or web address] Date: 06/22/2023 Prepared by: Gustavus Bryant  Exercises - Supine Knee Extension Strengthening  - 5 x daily - 5 x weekly - 1 sets - 10 reps - Supine Heel Slide with Strap  - 5 x daily - 5 x weekly - 1 sets - 10 reps - Supine Quad Set  - 5 x daily - 5 x weekly - 1 sets - 10 reps - 3s hold  ASSESSMENT:  CLINICAL IMPRESSION:  Patient reports continued high levels of pain in her Rt knee and that she has been compliant with her HEP. Session today continued to focus on Rt knee ROM, particularly into flexion, and quad activation. Able to achieve 94 flexion with strap today. Patient continues to benefit from skilled PT services and should be progressed as able to  improve functional independence.    OBJECTIVE IMPAIRMENTS: Abnormal gait, decreased activity tolerance, decreased mobility, difficulty walking, decreased ROM, decreased strength, obesity, and pain.   ACTIVITY LIMITATIONS: carrying, lifting, sitting, standing, squatting, stairs, and locomotion level  PERSONAL FACTORS: Fitness, Past/current experiences, and 1 comorbidity: infection  are also affecting patient's functional outcome.   REHAB POTENTIAL: Good  CLINICAL DECISION MAKING: Evolving/moderate complexity  EVALUATION COMPLEXITY: Low   GOALS: Goals reviewed with patient? No  SHORT TERM GOALS: Target date: 07/13/2023   Patient to demonstrate independence in HEP  Baseline: [email or web address] Goal status: INITIAL  2.  Increase R knee flexion to 90d Baseline: 82d; 07/13/23 95d Goal status: Met  3.  Able to perform SLR w/o extensor lag Baseline: Extensor lag present Goal status: Ongoing   LONG TERM GOALS: Target date: 08/03/2023    Patient will score at least 64% on FOTO to signify clinically meaningful improvement in functional abilities.   Baseline: 54% Goal status: INITIAL  2.  Patient will increase 30s chair stand reps from 3 to 6 with/without arms to demonstrate and improved functional ability with less pain/difficulty as well as reduce fall risk.  Baseline: 3 Goal status: INITIAL  3.  Patient will acknowledge 6/10 pain at least once during episode of care   Baseline: 10/10 Goal status: INITIAL  4.  224ft ambulation with LRAD Baseline: 139ft with RW Goal status: INITIAL  5.  Patient to negotiate 16 steps with most appropriate pattern Baseline: TBD Goal status: INITIAL  6.  Increase R knee flexion to 110d Baseline: 82d Goal status: INITIAL   PLAN:  PT FREQUENCY: 2x/week  PT DURATION: 6 weeks  PLANNED INTERVENTIONS: 97164- PT Re-evaluation, 97110-Therapeutic exercises, 97530- Therapeutic activity, 97112- Neuromuscular re-education, 97535- Self Care, 16109-  Manual therapy, 97116- Gait training, 97014- Electrical stimulation (unattended), Patient/Family education, Balance training, Stair training, Joint mobilization, Cryotherapy, and Moist heat  PLAN FOR NEXT SESSION: HEP review and update, manual techniques as appropriate, aerobic tasks, ROM and flexibility activities, strengthening and PREs, TPDN, gait and balance training as needed   For all possible CPT codes, reference the Planned Interventions line above.     Check all conditions that are expected to impact treatment: {Conditions expected to impact treatment:Unknown   If treatment provided at initial evaluation, no treatment charged due to lack of authorization.       Hildred Laser, PT 07/26/2023, 8:14 AM

## 2023-07-27 ENCOUNTER — Encounter: Payer: Self-pay | Admitting: Physical Medicine & Rehabilitation

## 2023-07-27 ENCOUNTER — Ambulatory Visit: Payer: Medicaid Other

## 2023-07-28 ENCOUNTER — Telehealth: Payer: Self-pay

## 2023-07-28 NOTE — Telephone Encounter (Signed)
TC to patient to notify her provider will be out of office on 12/30 and she will need to reschedule.  VM left with request to call front office.

## 2023-07-31 NOTE — Therapy (Unsigned)
OUTPATIENT PHYSICAL THERAPY TREATMENT NOTE   Patient Name: Sharon Faulkner MRN: [id number] DOB:10/13/1975, 47 y.o., female Today's Date: 08/03/2023  END OF SESSION:  PT End of Session - 08/03/23 1658     Visit Number 5    Number of Visits 7    Date for PT Re-Evaluation 08/22/23    Authorization Type MCD    PT Start Time 1700    PT Stop Time 1740    PT Time Calculation (min) 40 min    Activity Tolerance Patient limited by pain    Behavior During Therapy [facility] for tasks assessed/performed              Past Medical History:  Diagnosis Date   Asthma    Past Surgical History:  Procedure Laterality Date   INCISION AND DRAINAGE ABSCESS Right 04/14/2023   Procedure: INCISION AND DRAINAGE RIGHT KNEE;  Surgeon: Sheral Apley, MD;  Location: [facility];  Service: Orthopedics;  Laterality: Right;   KNEE ARTHROSCOPY Right 04/09/2023   Procedure: ARTHROSCOPY KNEE RIGHT;  Surgeon: Sheral Apley, MD;  Location: [facility] OR;  Service: Orthopedics;  Laterality: Right;   TOTAL KNEE ARTHROPLASTY Right 04/14/2023   Procedure: TOTAL KNEE ARTHROPLASTY;  Surgeon: Sheral Apley, MD;  Location: [facility];  Service: Orthopedics;  Laterality: Right;   Patient Active Problem List   Diagnosis Date Noted   Primary hypertension 05/07/2023   Concern for acute osteomyelitis of femoal condyle and tibial plateau (HCC) 04/19/2023   Obesity, Class III, BMI 40-49.9 (morbid obesity) (HCC) 04/19/2023   Asthma 04/09/2023   Acute on chronic anemia 04/09/2023   Septic arthritis of knee, 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  Remains on antibiotics.  Unable to bend knee or put pressure on it.  EVAL: Reports R knee pain following TKA due to septic arthritis.  Required 6 weeks IV ABX then oral meds. Has been working on home based exercise for ROM and strength  PERTINENT HISTORY: HPI Sharon Faulkner is a 47 y.o. 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   She presents with ongoing discomfort despite attempts at pain management. She describes the pain as an 'aching' sensation that worsens after being up all day. The pain is so severe that it disrupts her sleep, causing her to describe her leg as 'running around the bed.' The pain also affects her mobility, causing her to shift her weight onto her other leg, which in turn causes hip and back pain.   The patient has tried various pain medications, including codeine and hydrocodone, but has had adverse reactions to both. She has also tried methocarbamol which has been ineffective.  At her last visit I had placed her on Duloxetine but she found this to be intolerable due to side effects such as nausea.  She continues to follow-up with her orthopedic and she did have a visit with infectious disease on 05/2023 and still remains on her antibiotic infusion. PAIN:  Are you having pain? Yes: NPRS scale: 10+/10 Pain location: R knee Pain description: ache, throb Aggravating factors: weightbearing, bending, movement Relieving factors: position changes, medication  PRECAUTIONS: Fall  RED FLAGS: None   WEIGHT BEARING RESTRICTIONS: No  FALLS:  Has patient fallen in last 6 months? No    OCCUPATION: not  working  PLOF: Independent  PATIENT GOALS: To regain my knee function  NEXT MD VISIT: 06/25/23  OBJECTIVE:  Note: Objective measures were completed at Evaluation unless otherwise noted.  DIAGNOSTIC FINDINGS: CLINICAL DATA:  Status post total right knee arthroplasty. Osteomyelitis of right knee region. Septic arthritis of right knee.   EXAM: PORTABLE RIGHT KNEE - 1-2 VIEW   COMPARISON:  Right knee radiographs 04/08/2023, MRI right knee 04/09/2023   FINDINGS: Interval total right knee arthroplasty with metallic femoral component and lower density tibial component. Antibiotic beads are seen throughout the tibial tunnel common femoral tunnel, and medial and suprapatellar compartment. Moderate joint effusion. Mild subcutaneous and intra-articular air. No cortical erosion is seen.   IMPRESSION: Interval total right knee arthroplasty, presumably for resection of the prior medial greater than lateral compartment articular surface septic arthritis/osteomyelitis, with antibiotic beads in place. Moderate joint effusion.     Electronically Signed   By: Neita Garnet M.D.   On: 04/14/2023 19:25  PATIENT SURVEYS:  FOTO 54(64 predicted)  MUSCLE LENGTH: N/T  POSTURE: N/T  PALPATION: N/T  LOWER EXTREMITY ROM:  A/PROM Right eval Left eval Right 07/07/23 Right 07/20/23  Hip flexion      Hip extension      Hip abduction      Hip adduction      Hip internal rotation      Hip external rotation      Knee flexion 70/82d  83 AAROM 94 AAROM  Knee extension -8/-4d     Ankle dorsiflexion      Ankle plantarflexion      Ankle inversion      Ankle eversion       (Blank rows = not tested)  LOWER EXTREMITY MMT:  MMT Right eval Left eval  Hip flexion 3+   Hip extension 3+   Hip abduction 3+   Hip adduction    Hip internal rotation    Hip external rotation    Knee flexion 3+   Knee extension 3   Ankle dorsiflexion    Ankle plantarflexion 3+   Ankle inversion     Ankle eversion     (Blank rows = not tested)  LOWER EXTREMITY SPECIAL TESTS: NT   FUNCTIONAL TESTS:  30 seconds chair stand test 3 reps  GAIT: Distance walked: 12ftx2 Assistive device utilized: Environmental consultant - 2 wheeled Level of assistance: Complete Independence Comments: slow antalgic gait   TODAY'S TREATMENT:  [facility] Adult PT Treatment:                                                DATE: 08/03/23 Therapeutic Exercise: NuStep L4 6 min R FAQs 2# 15x R QS 3s 15x R SLR 15x 2s 2# R SAQs 15x 2s Heel slides over board w/strap 15x 98d FAQ 2# 15x 2s Standing heel raises 15x Standing ham curls Manual Therapy: STM into flexion  in sitting  [facility] Adult PT Treatment:                                                DATE: 07/20/23 Therapeutic Exercise: NuStep L1 6 min R QS 3s 15x R SLR 15x 2s R SAQs 15x 2s Heel slides over board w/strap 15x FAQ over towel roll 15x 2s Standing heel raises 2x10 Manual Therapy: PROM flexion with distraction sitting EOM Rt   [facility] Adult PT Treatment:                                                DATE: 07/13/23 Therapeutic Exercise: Nustep L1 6 min R QS 3s 15x R SLR 15x 2s R SAQs 15x 2s Heel slides over board w/strap 15x FAQ over towel roll 15x 2s TKE GTB 15x Standing heel raises 15x       [facility] Adult PT Treatment:                                                DATE: 07/07/23 Therapeutic Exercise: Seated heel/toe raises 2x10 LAQ 2x10 Rt Seated heel slides - cues to decrease trunk lean onto Lt hip SAQ x10, x5 SLR x5  Supine knee flexion with strap x10 STS 2x5                                                                                                                  PATIENT EDUCATION:  Education details: Discussed eval findings, rehab rationale and POC and patient is in agreement  Person educated: Patient Education method: Explanation Education comprehension: verbalized understanding and needs further education  HOME EXERCISE  PROGRAM: Access Code: [email or web address] URL: [email or web address] Date: 06/22/2023 Prepared by: Gustavus Bryant  Exercises - Supine Knee Extension Strengthening  - 5 x daily - 5 x weekly - 1 sets - 10 reps - Supine Heel Slide with Strap  - 5 x daily - 5 x weekly - 1 sets - 10 reps - Supine Quad Set  - 5 x daily - 5 x weekly - 1 sets - 10 reps - 3s hold  ASSESSMENT:  CLINICAL IMPRESSION: Focus of today was increasing knee flexion, TKE observed to be [facility] during gait cycle.  98d flexion measured following heel slides.  Added weight and resistance to tasks as noted.  Remains extremely  TTP anterior knee with low tolerance for manual tasks.     OBJECTIVE IMPAIRMENTS: Abnormal gait, decreased activity tolerance, decreased mobility, difficulty walking, decreased ROM, decreased strength, obesity, and pain.   ACTIVITY LIMITATIONS: carrying, lifting, sitting, standing, squatting, stairs, and locomotion level  PERSONAL FACTORS: Fitness, Past/current  experiences, and 1 comorbidity: infection  are also affecting patient's functional outcome.   REHAB POTENTIAL: Good  CLINICAL DECISION MAKING: Evolving/moderate complexity  EVALUATION COMPLEXITY: Low   GOALS: Goals reviewed with patient? No  SHORT TERM GOALS: Target date: 07/13/2023   Patient to demonstrate independence in HEP  Baseline: [email or web address] Goal status: Met  2.  Increase R knee flexion to 90d Baseline: 82d; 07/13/23 95d Goal status: Met  3.  Able to perform SLR w/o extensor lag Baseline: Extensor lag present Goal status: Ongoing   LONG TERM GOALS: Target date: 08/03/2023    Patient will score at least 64% on FOTO to signify clinically meaningful improvement in functional abilities.   Baseline: 54% Goal status: INITIAL  2.  Patient will increase 30s chair stand reps from 3 to 6 with/without arms to demonstrate and improved functional ability with less pain/difficulty as well as reduce fall risk.  Baseline: 3 Goal  status: INITIAL  3.  Patient will acknowledge 6/10 pain at least once during episode of care   Baseline: 10/10 Goal status: INITIAL  4.  265ft ambulation with LRAD Baseline: 11ft with RW Goal status: INITIAL  5.  Patient to negotiate 16 steps with most appropriate pattern Baseline: TBD Goal status: INITIAL  6.  Increase R knee flexion to 110d Baseline: 82d Goal status: INITIAL   PLAN:  PT FREQUENCY: 2x/week  PT DURATION: 6 weeks  PLANNED INTERVENTIONS: 97164- PT Re-evaluation, 97110-Therapeutic exercises, 97530- Therapeutic activity, 97112- Neuromuscular re-education, 97535- Self Care, 16109- Manual therapy, 97116- Gait training, 97014- Electrical stimulation (unattended), Patient/Family education, Balance training, Stair training, Joint mobilization, Cryotherapy, and Moist heat  PLAN FOR NEXT SESSION: HEP review and update, manual techniques as appropriate, aerobic tasks, ROM and flexibility activities, strengthening and PREs, TPDN, gait and balance training as needed   For all possible CPT codes, reference the Planned Interventions line above.     Check all conditions that are expected to impact treatment: {Conditions expected to impact treatment:Unknown   If treatment provided at initial evaluation, no treatment charged due to lack of authorization.       Hildred Laser, PT 08/03/2023, 5:44 PM

## 2023-08-03 ENCOUNTER — Ambulatory Visit: Payer: Medicaid Other

## 2023-08-03 DIAGNOSIS — Z96651 Presence of right artificial knee joint: Secondary | ICD-10-CM

## 2023-08-03 DIAGNOSIS — M25561 Pain in right knee: Secondary | ICD-10-CM

## 2023-08-03 DIAGNOSIS — M6281 Muscle weakness (generalized): Secondary | ICD-10-CM

## 2023-08-09 ENCOUNTER — Other Ambulatory Visit: Payer: Self-pay | Admitting: Internal Medicine

## 2023-08-10 ENCOUNTER — Encounter: Payer: Self-pay | Admitting: Physical Therapy

## 2023-08-10 ENCOUNTER — Ambulatory Visit: Payer: Medicaid Other | Admitting: Physical Therapy

## 2023-08-10 DIAGNOSIS — M6281 Muscle weakness (generalized): Secondary | ICD-10-CM

## 2023-08-10 DIAGNOSIS — M25561 Pain in right knee: Secondary | ICD-10-CM | POA: Diagnosis not present

## 2023-08-10 DIAGNOSIS — Z96651 Presence of right artificial knee joint: Secondary | ICD-10-CM

## 2023-08-10 NOTE — Therapy (Signed)
OUTPATIENT PHYSICAL THERAPY TREATMENT NOTE   Patient Name: Sharon Faulkner MRN: [id number] DOB:1975/11/24, 47 y.o., female Today's Date: 08/10/2023  END OF SESSION:  PT End of Session - 08/10/23 1639     Visit Number 6    Number of Visits 7    Date for PT Re-Evaluation 08/22/23    Authorization Type MCD    PT Start Time 1615    PT Stop Time 1700    PT Time Calculation (min) 45 min    Activity Tolerance Patient tolerated treatment well    Behavior During Therapy [facility] for tasks assessed/performed               Past Medical History:  Diagnosis Date   Asthma    Past Surgical History:  Procedure Laterality Date   INCISION AND DRAINAGE ABSCESS Right 04/14/2023   Procedure: INCISION AND DRAINAGE RIGHT KNEE;  Surgeon: Sheral Apley, MD;  Location: [facility];  Service: Orthopedics;  Laterality: Right;   KNEE ARTHROSCOPY Right 04/09/2023   Procedure: ARTHROSCOPY KNEE RIGHT;  Surgeon: Sheral Apley, MD;  Location: [facility] OR;  Service: Orthopedics;  Laterality: Right;   TOTAL KNEE ARTHROPLASTY Right 04/14/2023   Procedure: TOTAL KNEE ARTHROPLASTY;  Surgeon: Sheral Apley, MD;  Location: [facility];  Service: Orthopedics;  Laterality: Right;   Patient Active Problem List   Diagnosis Date Noted   Primary hypertension 05/07/2023   Concern for acute osteomyelitis of femoal condyle and tibial plateau (HCC) 04/19/2023   Obesity, Class III, BMI 40-49.9 (morbid obesity) (HCC) 04/19/2023   Asthma 04/09/2023   Acute on chronic anemia 04/09/2023   Septic arthritis of knee, 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   PERTINENT HISTORY: HPI Sharon Faulkner is a 47 y.o. 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   She presents with ongoing discomfort despite attempts at pain management. She describes the pain as an 'aching' sensation that worsens after being up all day. The pain is so severe that it disrupts her sleep, causing her to describe her leg as 'running around the bed.' The pain also affects her mobility, causing her to shift her weight onto her other leg, which in turn causes hip and back pain.   The patient has tried various pain medications, including codeine and hydrocodone, but has had adverse reactions to both. She has also tried methocarbamol which has been ineffective.  At her last visit I had placed her on Duloxetine but she found this to be intolerable due to side effects such as nausea. She continues to follow-up with her orthopedic and she did have a visit with infectious disease on 05/2023 and still remains on her antibiotic  infusion. PAIN:  Are you having pain? Yes: NPRS scale: 10+/10 Pain location: R knee Pain description: ache, throb Aggravating factors: weightbearing, bending, movement Relieving factors: position changes, medication  PRECAUTIONS: Fall  RED FLAGS: None   WEIGHT BEARING RESTRICTIONS: No  FALLS:  Has patient fallen in last 6 months? No    OCCUPATION: not working  PLOF: Independent  PATIENT GOALS: To regain my knee function  NEXT MD VISIT: 06/25/23  OBJECTIVE:  Note: Objective  measures were completed at Evaluation unless otherwise noted.  DIAGNOSTIC FINDINGS: CLINICAL DATA:  Status post total right knee arthroplasty. Osteomyelitis of right knee region. Septic arthritis of right knee.   EXAM: PORTABLE RIGHT KNEE - 1-2 VIEW   COMPARISON:  Right knee radiographs 04/08/2023, MRI right knee 04/09/2023   FINDINGS: Interval total right knee arthroplasty with metallic femoral component and lower density tibial component. Antibiotic beads are seen throughout the tibial tunnel common femoral tunnel, and medial and suprapatellar compartment. Moderate joint effusion. Mild subcutaneous and intra-articular air. No cortical erosion is seen.   IMPRESSION: Interval total right knee arthroplasty, presumably for resection of the prior medial greater than lateral compartment articular surface septic arthritis/osteomyelitis, with antibiotic beads in place. Moderate joint effusion.     Electronically Signed   By: Neita Garnet M.D.   On: 04/14/2023 19:25  PATIENT SURVEYS:  FOTO 54(64 predicted)  MUSCLE LENGTH: N/T  POSTURE: N/T  PALPATION: N/T  LOWER EXTREMITY ROM:  A/PROM Right eval Left eval Right 07/07/23 Right 07/20/23  Hip flexion      Hip extension      Hip abduction      Hip adduction      Hip internal rotation      Hip external rotation      Knee flexion 70/82d  83 AAROM 94 AAROM  Knee extension -8/-4d     Ankle dorsiflexion      Ankle plantarflexion      Ankle inversion      Ankle eversion       (Blank rows = not tested)  LOWER EXTREMITY MMT:  MMT Right eval Left eval  Hip flexion 3+   Hip extension 3+   Hip abduction 3+   Hip adduction    Hip internal rotation    Hip external rotation    Knee flexion 3+   Knee extension 3   Ankle dorsiflexion    Ankle plantarflexion 3+   Ankle inversion    Ankle eversion     (Blank rows = not tested)  LOWER EXTREMITY SPECIAL TESTS: NT   FUNCTIONAL TESTS:  30 seconds chair stand test  3 reps  GAIT: Distance walked: 157ftx2 Assistive device utilized: Environmental consultant - 2 wheeled Level of assistance: Complete Independence Comments: slow antalgic gait   TODAY'S TREATMENT:  [facility] Adult PT Treatment:                                                DATE: 08/10/2023  Therapeutic Exercise: NuStep L4 6 min R FAQs 2# 15x R QS 3s 15x R SLR 15x 2s 2# R SAQs 15x 2s Heel slides on plinth w/ stretch strap FAQ 2# 15x 2s Standing heel raises 15x  Manual Therapy: Manual knee flexion with distraction Patellar mobilization Desensitization training  Modalities: Ice pack with elevation  [facility] Adult PT Treatment:  DATE: 08/03/23 Therapeutic Exercise: NuStep L4 6 min R FAQs 2# 15x R QS 3s 15x R SLR 15x 2s 2# R SAQs 15x 2s Heel slides over board w/strap 15x 98d FAQ 2# 15x 2s Standing heel raises 15x Standing ham curls Manual Therapy: STM into flexion in sitting   PATIENT EDUCATION:  Education details: Discussed eval findings, rehab rationale and POC and patient is in agreement  Person educated: Patient Education method: Explanation Education comprehension: verbalized understanding and needs further education  HOME EXERCISE PROGRAM: Access Code: [email or web address] URL: [email or web address] Date: 06/22/2023 Prepared by: Gustavus Bryant  Exercises - Supine Knee Extension Strengthening  - 5 x daily - 5 x weekly - 1 sets - 10 reps - Supine Heel Slide with Strap  - 5 x daily - 5 x weekly - 1 sets - 10 reps - Supine Quad Set  - 5 x daily - 5 x weekly - 1 sets - 10 reps - 3s hold  ASSESSMENT:  CLINICAL IMPRESSION:  Pt attended physical therapy session for continuation of treatment regarding R TKA. Pt tolerated treatment well and demostrated improvement with quadriceps activation, knee flexion ROM (95*), and quality of movement. Pt responded well to desensitization training and verbalized competence for at home care, showed   progress with sensitivity on anterior knee.. Some difficulties continued with . Pt required minimal vc for safe and appropriate performance of today's activities.   OBJECTIVE IMPAIRMENTS: Abnormal gait, decreased activity tolerance, decreased mobility, difficulty walking, decreased ROM, decreased strength, obesity, and pain.   ACTIVITY LIMITATIONS: carrying, lifting, sitting, standing, squatting, stairs, and locomotion level  PERSONAL FACTORS: Fitness, Past/current experiences, and 1 comorbidity: infection  are also affecting patient's functional outcome.   REHAB POTENTIAL: Good  CLINICAL DECISION MAKING: Evolving/moderate complexity  EVALUATION COMPLEXITY: Low   GOALS: Goals reviewed with patient? No  SHORT TERM GOALS: Target date: 07/13/2023   Patient to demonstrate independence in HEP  Baseline: [email or web address] Goal status: Met  2.  Increase R knee flexion to 90d Baseline: 82d; 07/13/23 95d Goal status: Met  3.  Able to perform SLR w/o extensor lag Baseline: Extensor lag present Goal status: Ongoing   LONG TERM GOALS: Target date: 08/03/2023    Patient will score at least 64% on FOTO to signify clinically meaningful improvement in functional abilities.   Baseline: 54% Goal status: INITIAL  2.  Patient will increase 30s chair stand reps from 3 to 6 with/without arms to demonstrate and improved functional ability with less pain/difficulty as well as reduce fall risk.  Baseline: 3 Goal status: INITIAL  3.  Patient will acknowledge 6/10 pain at least once during episode of care   Baseline: 10/10 Goal status: INITIAL  4.  246ft ambulation with LRAD Baseline: 133ft with RW Goal status: INITIAL  5.  Patient to negotiate 16 steps with most appropriate pattern Baseline: TBD Goal status: INITIAL  6.  Increase R knee flexion to 110d Baseline: 82d Goal status: INITIAL   PLAN:  PT FREQUENCY: 2x/week  PT DURATION: 6 weeks  PLANNED INTERVENTIONS: 97164- PT  Re-evaluation, 97110-Therapeutic exercises, 97530- Therapeutic activity, 97112- Neuromuscular re-education, 97535- Self Care, [id number]- Manual therapy, 97116- Gait training, 97014- Electrical stimulation (unattended), Patient/Family education, Balance training, Stair training, Joint mobilization, Cryotherapy, and Moist heat  PLAN FOR NEXT SESSION: HEP review and update, manual techniques as appropriate, aerobic tasks, ROM and flexibility activities, strengthening and PREs, TPDN, gait and balance training as needed   For all possible CPT codes, reference the Planned Interventions line above.  Check all conditions that are expected to impact treatment: {Conditions expected to impact treatment:Unknown   If treatment provided at initial evaluation, no treatment charged due to lack of authorization.      Sheliah Plane, PT, DPT 08/10/2023, 5:19 PM

## 2023-08-13 ENCOUNTER — Encounter: Payer: Medicaid Other | Attending: Physical Medicine & Rehabilitation | Admitting: Physical Medicine & Rehabilitation

## 2023-08-13 ENCOUNTER — Encounter: Payer: Self-pay | Admitting: Physical Medicine & Rehabilitation

## 2023-08-13 VITALS — Ht 64.0 in | Wt 288.0 lb

## 2023-08-13 DIAGNOSIS — G8929 Other chronic pain: Secondary | ICD-10-CM | POA: Insufficient documentation

## 2023-08-13 DIAGNOSIS — G894 Chronic pain syndrome: Secondary | ICD-10-CM | POA: Insufficient documentation

## 2023-08-13 DIAGNOSIS — Z5181 Encounter for therapeutic drug level monitoring: Secondary | ICD-10-CM | POA: Diagnosis not present

## 2023-08-13 DIAGNOSIS — M25561 Pain in right knee: Secondary | ICD-10-CM | POA: Insufficient documentation

## 2023-08-13 DIAGNOSIS — Z79899 Other long term (current) drug therapy: Secondary | ICD-10-CM | POA: Diagnosis present

## 2023-08-13 DIAGNOSIS — M25552 Pain in left hip: Secondary | ICD-10-CM | POA: Diagnosis present

## 2023-08-13 DIAGNOSIS — Z96651 Presence of right artificial knee joint: Secondary | ICD-10-CM | POA: Insufficient documentation

## 2023-08-13 DIAGNOSIS — M545 Low back pain, unspecified: Secondary | ICD-10-CM | POA: Diagnosis present

## 2023-08-13 NOTE — Progress Notes (Addendum)
 Subjective:    Patient ID: Sharon Faulkner, female    DOB: 09/06/1975, 48 y.o.   MRN: [id number]  HPI   HPI  Sharon Faulkner is a 48 y.o. year old female  who  has a past medical history of Asthma.   They are presenting to PM&R clinic as a new patient for pain management evaluation. They were referred by PA Gerard Large for treatment of R knee pain.  Patient reports she developed knee pain around July 2024.  Right knee pain continue to progress to where she required a wheelchair for ambulation.  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  Patient was taken to the OR for irrigation and debridement by Dr. Dr. Evalene Chancy 04/14/2023 but ultimately required TKA and antibiotic bead placement.  She has been working with physical therapy.  She continues to have severe pain in her right knee, although it is gradually improving.  During this time she has also developed milder pain in her left hip and lower back.  She feels like this is due to her altered gait.  She has been doing physical therapy for the last several weeks, reports this is helping her function.  Knee pain is less severe when she is sitting still but becomes very severe when ambulating.  Pain is sharp in quality.  Sometimes associated with muscle spasms in her shins.  Patient has been using meloxicam , this provides mild benefit but she reports she has to take more than 1 pill so really keep the pain at a manageable level.  Robaxin  also helps her pain.  She is previously treated with oxycodone , hydrocodone , tramadol .  She reports tramadol  did not help her pain.  Hydrocodone  and oxycodone  helped reduce her pain.  She denies any side effects with pain medications ending with codon but says that codeine caused her to itch.   Pt did report use of CBD product she purchased at a store recently.   Red flag symptoms: No red flags for back pain endorsed in Hx or ROS  Medications tried: Nsaids-  Meloxicam - helps a little  Tylenol   minimal benefit Opiates   Dilaudid - worked well  Oxycodone  and Hydrocodone - helpful  Codeine- itching Tramadol - dizzy and sick Gabapentin- gabapentin didn't help  SNRIs  -  Duloxetine  made her feel nauseate  Robaxin  500mg  makes her sleepy but does provide benefit  Other treatments: PT- Helping her function  TENs unit Has not tried Surgery RKA    Prior UDS results: No results found for: LABOPIA, COCAINSCRNUR, LABBENZ, AMPHETMU, THCU, LABBARB     Pain Inventory Average Pain 6 Pain Right Now 9 My pain is sharp, burning, stabbing, and aching  In the last 24 hours, has pain interfered with the following? General activity 8 Relation with others 0 Enjoyment of life 8 What TIME of day is your pain at its worst? morning , evening, and night Sleep (in general) Poor  Pain is worse with: walking, bending, standing, and some activites Pain improves with: rest and medication Relief from Meds: 4  walk without assistance ability to climb steps?  no do you drive?  yes  not employed: date last employed .  weakness numbness tingling trouble walking spasms  Any changes since last visit?  no  Any changes since last visit?  no    No family history on file. Social History   Socioeconomic History   Marital status: Single    Spouse name: Not on  file   Number of children: Not on file   Years of education: Not on file   Highest education level: Not on file  Occupational History   Not on file  Tobacco Use   Smoking status: Every Day    Current packs/day: 0.50    Types: Cigarettes   Smokeless tobacco: Never  Substance and Sexual Activity   Alcohol use: Not on file    Comment: occasional   Drug use: No   Sexual activity: Yes  Other Topics Concern   Not on file  Social History Narrative   Not on file   Social Drivers of Health   Financial Resource Strain: High Risk (06/08/2023)   Overall Financial Resource Strain  (CARDIA)    Difficulty of Paying Living Expenses: Hard  Food Insecurity: Food Insecurity Present (06/08/2023)   Hunger Vital Sign    Worried About Running Out of Food in the Last Year: Often true    Ran Out of Food in the Last Year: Sometimes true  Transportation Needs: No Transportation Needs (06/08/2023)   PRAPARE - Administrator, Civil Service (Medical): No    Lack of Transportation (Non-Medical): No  Physical Activity: Inactive (06/08/2023)   Exercise Vital Sign    Days of Exercise per Week: 0 days    Minutes of Exercise per Session: 0 min  Stress: Stress Concern Present (06/08/2023)   [employer] of Occupational Health - Occupational Stress Questionnaire    Feeling of Stress : To some extent  Social Connections: Unknown (06/08/2023)   Social Connection and Isolation Panel [NHANES]    Frequency of Communication with Friends and Family: Three times a week    Frequency of Social Gatherings with Friends and Family: Once a week    Attends Religious Services: Never    Database Administrator or Organizations: No    Attends Engineer, Structural: Never    Marital Status: Patient declined   Past Surgical History:  Procedure Laterality Date   INCISION AND DRAINAGE ABSCESS Right 04/14/2023   Procedure: INCISION AND DRAINAGE RIGHT KNEE;  Surgeon: Sharon Evalene BIRCH, MD;  Location: [facility];  Service: Orthopedics;  Laterality: Right;   KNEE ARTHROSCOPY Right 04/09/2023   Procedure: ARTHROSCOPY KNEE RIGHT;  Surgeon: Sharon Evalene BIRCH, MD;  Location: [facility] OR;  Service: Orthopedics;  Laterality: Right;   TOTAL KNEE ARTHROPLASTY Right 04/14/2023   Procedure: TOTAL KNEE ARTHROPLASTY;  Surgeon: Sharon Evalene BIRCH, MD;  Location: [facility];  Service: Orthopedics;  Laterality: Right;   Past Medical History:  Diagnosis Date   Asthma    Ht 5' 4 (1.626 m)   Wt 288 lb (130.6 kg)   BMI 49.44 kg/m   Opioid Risk Score:   Fall Risk Score:  `1  Depression screen PHQ 2/9      08/13/2023    2:12 PM 05/21/2023    2:31 PM 05/07/2023   10:51 AM  Depression screen PHQ 2/9  Decreased Interest 0 0 0  Down, Depressed, Hopeless 0 1 3  PHQ - 2 Score 0 1 3  Altered sleeping 0  3  Tired, decreased energy 0  2  Change in appetite 0  2  Feeling bad or failure about yourself  0  2  Trouble concentrating 0  1  Moving slowly or fidgety/restless 0  0  Suicidal thoughts 0  0  PHQ-9 Score 0  13  Difficult doing work/chores Not difficult at all        Review  of Systems  Musculoskeletal:  Positive for back pain and gait problem.       Left hip pain   All other systems reviewed and are negative.     Objective:   Physical Exam  Gen: no distress, normal appearing HEENT: oral mucosa pink and moist, NCAT Chest: normal effort, normal rate of breathing Abd: soft, non-distended Ext: no edema Psych: pleasant, normal affect Skin: intact Neuro: Alert and awake, follows commands, cranial nerves II through XII grossly intact, normal speech and language Strength 5/5 in b/l UE and LE Sensory exam normal for light touch and pain in all 4 limbs. No limb ataxia or cerebellar signs. No abnormal tone appreciated.  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    -Poor results with non-narcotic medications -Discussed not using more than 1 tab meloxicam  daily, she reports she has tried more than 1 at a time -Poor pain control with non-narcotic medications and treatments -Continue PT -UDS today -Pain agreement today -Consider starting norco 5 BID PRN, pt reports she had no  side effects with this medications. She reports only itching with codeine -Discussed foods that can be helpful for pain control   1/15 Called pt, UDS with etoh metabolities (she had few drink on new years), also low level THC consistent with her reported use of CBD product.  Will continue with plan as above, she has stopped CBD products will monitor UDS to confirm

## 2023-08-17 LAB — TOXASSURE SELECT,+ANTIDEPR,UR

## 2023-08-19 ENCOUNTER — Telehealth: Payer: Self-pay | Admitting: Physical Medicine & Rehabilitation

## 2023-08-19 NOTE — Telephone Encounter (Signed)
 Patient wants to know results of urine drug screen.

## 2023-08-23 NOTE — Therapy (Signed)
 OUTPATIENT PHYSICAL THERAPY TREATMENT NOTE   Patient Name: Sharon Faulkner MRN: [id number] DOB:Apr 09, 1976, 48 y.o., female Today's Date: 08/24/2023  END OF SESSION:  PT End of Session - 08/24/23 1700     Visit Number 7    Number of Visits 13    Date for PT Re-Evaluation 08/22/23    Authorization Type MCD    PT Start Time 1700    PT Stop Time 1745    PT Time Calculation (min) 45 min    Activity Tolerance Patient tolerated treatment well    Behavior During Therapy [facility] for tasks assessed/performed                Past Medical History:  Diagnosis Date   Asthma    Past Surgical History:  Procedure Laterality Date   INCISION AND DRAINAGE ABSCESS Right 04/14/2023   Procedure: INCISION AND DRAINAGE RIGHT KNEE;  Surgeon: Beverley Evalene BIRCH, MD;  Location: [facility];  Service: Orthopedics;  Laterality: Right;   KNEE ARTHROSCOPY Right 04/09/2023   Procedure: ARTHROSCOPY KNEE RIGHT;  Surgeon: Beverley Evalene BIRCH, MD;  Location: [facility] OR;  Service: Orthopedics;  Laterality: Right;   TOTAL KNEE ARTHROPLASTY Right 04/14/2023   Procedure: TOTAL KNEE ARTHROPLASTY;  Surgeon: Beverley Evalene BIRCH, MD;  Location: [facility];  Service: Orthopedics;  Laterality: Right;   Patient Active Problem List   Diagnosis Date Noted   Primary hypertension 05/07/2023   Concern for acute osteomyelitis of femoal condyle and tibial plateau (HCC) 04/19/2023   Obesity, Class III, BMI 40-49.9 (morbid obesity) (HCC) 04/19/2023   Asthma 04/09/2023   Acute on chronic anemia 04/09/2023   Septic arthritis of knee, 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   PERTINENT HISTORY: HPI Sharon Faulkner is a 48 y.o. 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   She presents with ongoing discomfort despite attempts at pain management. She describes the pain as an 'aching' sensation that worsens after being up all day. The pain is so severe that it disrupts her sleep, causing her to describe her leg as 'running around the bed.' The pain also affects her mobility, causing her to shift her weight onto her other leg, which in turn causes hip and back pain.   The patient has tried various pain medications, including codeine and hydrocodone , but has had adverse reactions to both. She has also tried methocarbamol  which has been ineffective.  At her last visit I had placed her on Duloxetine  but she found this to be intolerable due to side effects such as nausea. She continues to follow-up with her orthopedic and she did have a visit with infectious disease on 05/2023 and still remains on her antibiotic infusion. PAIN:  Are you having pain?  Yes: NPRS scale: 10+/10 Pain location: R knee Pain description: ache, throb Aggravating factors: weightbearing, bending, movement Relieving factors: position changes, medication  PRECAUTIONS: Fall  RED FLAGS: None   WEIGHT BEARING RESTRICTIONS: No  FALLS:  Has patient fallen in last 6 months? No    OCCUPATION: not working  PLOF: Independent  PATIENT GOALS: To regain my knee function  NEXT MD VISIT: 06/25/23  OBJECTIVE:  Note: Objective measures  were completed at Evaluation unless otherwise noted.  DIAGNOSTIC FINDINGS: CLINICAL DATA:  Status post total right knee arthroplasty. Osteomyelitis of right knee region. Septic arthritis of right knee.   EXAM: PORTABLE RIGHT KNEE - 1-2 VIEW   COMPARISON:  Right knee radiographs 04/08/2023, MRI right knee 04/09/2023   FINDINGS: Interval total right knee arthroplasty with metallic femoral component and lower density tibial component. Antibiotic beads are seen throughout the tibial tunnel common femoral tunnel, and medial and suprapatellar compartment. Moderate joint effusion. Mild subcutaneous and intra-articular air. No cortical erosion is seen.   IMPRESSION: Interval total right knee arthroplasty, presumably for resection of the prior medial greater than lateral compartment articular surface septic arthritis/osteomyelitis, with antibiotic beads in place. Moderate joint effusion.     Electronically Signed   By: Tanda Lyons M.D.   On: 04/14/2023 19:25  PATIENT SURVEYS:  FOTO 54(64 predicted)  08/24/23 42% (patient rates knee at 85%)  MUSCLE LENGTH: N/T  POSTURE: N/T  PALPATION: N/T  LOWER EXTREMITY ROM:  A/PROM Right eval Left eval Right 07/07/23 Right 07/20/23 R 08/24/23  Hip flexion       Hip extension       Hip abduction       Hip adduction       Hip internal rotation       Hip external rotation       Knee flexion 70/82d  83 AAROM 94 AAROM 102d  Knee extension -8/-4d    0d  Ankle dorsiflexion       Ankle plantarflexion       Ankle inversion       Ankle eversion        (Blank rows = not tested)  LOWER EXTREMITY MMT:  MMT Right eval Left eval R 08/24/23  Hip flexion 3+  4-  Hip extension 3+  4-  Hip abduction 3+  4-  Hip adduction     Hip internal rotation     Hip external rotation     Knee flexion 3+  4-  Knee extension 3  3+  Ankle dorsiflexion     Ankle plantarflexion 3+  4  Ankle inversion     Ankle eversion      (Blank rows = not  tested)  LOWER EXTREMITY SPECIAL TESTS: NT   FUNCTIONAL TESTS:  30 seconds chair stand test 3 reps; 08/24/23 8 reps  GAIT: Distance walked: 140ftx2 Assistive device utilized: Environmental Consultant - 2 wheeled Level of assistance: Complete Independence Comments: slow antalgic gait   TODAY'S TREATMENT:  [facility] Adult PT Treatment:                                                DATE: 08/24/23 Therapeutic Exercise: Nustep L4 8 min Heel slides over board 15x2 102d flexion SLR 15x  Therapeutic Activity: 12 steps emphasizing step to pattern for safety 30s chair stand test 8 reps  [facility] Adult PT Treatment:  DATE: 08/10/2023  Therapeutic Exercise: NuStep L4 6 min R FAQs 2# 15x R QS 3s 15x R SLR 15x 2s 2# R SAQs 15x 2s Heel slides on plinth w/ stretch strap FAQ 2# 15x 2s Standing heel raises 15x  Manual Therapy: Manual knee flexion with distraction Patellar mobilization Desensitization training  Modalities: Ice pack with elevation  [facility] Adult PT Treatment:                                                DATE: 08/03/23 Therapeutic Exercise: NuStep L4 6 min R FAQs 2# 15x R QS 3s 15x R SLR 15x 2s 2# R SAQs 15x 2s Heel slides over board w/strap 15x 98d FAQ 2# 15x 2s Standing heel raises 15x Standing ham curls Manual Therapy: STM into flexion in sitting   PATIENT EDUCATION:  Education details: Discussed eval findings, rehab rationale and POC and patient is in agreement  Person educated: Patient Education method: Explanation Education comprehension: verbalized understanding and needs further education  HOME EXERCISE PROGRAM: Access Code: [email or web address] URL: [email or web address] Date: 06/22/2023 Prepared by: Reyes Kohut  Exercises - Supine Knee Extension Strengthening  - 5 x daily - 5 x weekly - 1 sets - 10 reps - Supine Heel Slide with Strap  - 5 x daily - 5 x weekly - 1 sets - 10 reps - Supine Quad Set  - 5 x daily - 5 x  weekly - 1 sets - 10 reps - 3s hold  ASSESSMENT:  CLINICAL IMPRESSION: Re-assessment of progress towards goals and re-certification.  Patient has gained ROM, strength and function, can negotiate stairs with step to pattern.  Progress towards goals measured and noted.  Overall function improved.  Recommend continue OPPT to meet unmet goals.   OBJECTIVE IMPAIRMENTS: Abnormal gait, decreased activity tolerance, decreased mobility, difficulty walking, decreased ROM, decreased strength, obesity, and pain.   ACTIVITY LIMITATIONS: carrying, lifting, sitting, standing, squatting, stairs, and locomotion level  PERSONAL FACTORS: Fitness, Past/current experiences, and 1 comorbidity: infection  are also affecting patient's functional outcome.   REHAB POTENTIAL: Good  CLINICAL DECISION MAKING: Evolving/moderate complexity  EVALUATION COMPLEXITY: Low   GOALS: Goals reviewed with patient? No  SHORT TERM GOALS: Target date: 07/13/2023   Patient to demonstrate independence in HEP  Baseline: [email or web address] Goal status: Met  2.  Increase R knee flexion to 90d Baseline: 82d; 07/13/23 95d Goal status: Met  3.  Able to perform SLR w/o extensor lag Baseline: Extensor lag present Goal status: Ongoing   LONG TERM GOALS: Target date: 08/03/2023    Patient will score at least 64% on FOTO to signify clinically meaningful improvement in functional abilities.   Baseline: 54%; 08/24/23 42% Goal status: Ongoing  2.  Patient will increase 30s chair stand reps from 3 to 6 with/without arms to demonstrate and improved functional ability with less pain/difficulty as well as reduce fall risk.  Baseline: 3; 08/24/23 8 reps Goal status: Met  3.  Patient will acknowledge 6/10 pain at least once during episode of care   Baseline: 10/10 Goal status: INITIAL  4.  237ft ambulation with LRAD Baseline: 172ft with RW; 08/24/23 Unrestricted w/o AD Goal status: Met  5.  Patient to negotiate 16 steps with most  appropriate pattern Baseline: TBD; 08/24/23 12 steps with B rails and step to pattern Goal status: Ongoing  6.  Increase R knee flexion  to 110d Baseline: 82d; 08/24/23 102 Goal status: Ongoing   PLAN:  PT FREQUENCY: 1x/week  PT DURATION: 6 weeks  PLANNED INTERVENTIONS: 97164- PT Re-evaluation, 97110-Therapeutic exercises, 97530- Therapeutic activity, 97112- Neuromuscular re-education, 97535- Self Care, 02859- Manual therapy, 97116- Gait training, 97014- Electrical stimulation (unattended), Patient/Family education, Balance training, Stair training, Joint mobilization, Cryotherapy, and Moist heat  PLAN FOR NEXT SESSION: HEP review and update, manual techniques as appropriate, aerobic tasks, ROM and flexibility activities, strengthening and PREs, TPDN, gait and balance training as needed   For all possible CPT codes, reference the Planned Interventions line above.     Check all conditions that are expected to impact treatment: {Conditions expected to impact treatment:Unknown   If treatment provided at initial evaluation, no treatment charged due to lack of authorization.      Jeff Yamilette Garretson PT  08/24/2023, 5:47 PM

## 2023-08-24 ENCOUNTER — Other Ambulatory Visit: Payer: Self-pay

## 2023-08-24 ENCOUNTER — Ambulatory Visit: Payer: Medicaid Other | Admitting: Internal Medicine

## 2023-08-24 ENCOUNTER — Encounter: Payer: Self-pay | Admitting: Internal Medicine

## 2023-08-24 ENCOUNTER — Ambulatory Visit: Payer: Medicaid Other | Attending: Orthopedic Surgery

## 2023-08-24 VITALS — BP 178/123 | HR 80 | Resp 16 | Ht 64.0 in | Wt 291.2 lb

## 2023-08-24 DIAGNOSIS — I10 Essential (primary) hypertension: Secondary | ICD-10-CM

## 2023-08-24 DIAGNOSIS — Z96651 Presence of right artificial knee joint: Secondary | ICD-10-CM | POA: Insufficient documentation

## 2023-08-24 DIAGNOSIS — M6281 Muscle weakness (generalized): Secondary | ICD-10-CM | POA: Insufficient documentation

## 2023-08-24 DIAGNOSIS — B372 Candidiasis of skin and nail: Secondary | ICD-10-CM | POA: Diagnosis not present

## 2023-08-24 DIAGNOSIS — M25561 Pain in right knee: Secondary | ICD-10-CM | POA: Diagnosis present

## 2023-08-24 DIAGNOSIS — B951 Streptococcus, group B, as the cause of diseases classified elsewhere: Secondary | ICD-10-CM

## 2023-08-24 DIAGNOSIS — M00261 Other streptococcal arthritis, right knee: Secondary | ICD-10-CM | POA: Diagnosis present

## 2023-08-24 DIAGNOSIS — F17210 Nicotine dependence, cigarettes, uncomplicated: Secondary | ICD-10-CM

## 2023-08-24 DIAGNOSIS — Z79899 Other long term (current) drug therapy: Secondary | ICD-10-CM

## 2023-08-24 MED ORDER — METHOCARBAMOL 500 MG PO TABS: 500.0000 mg | ORAL_TABLET | Freq: Four times a day (QID) | ORAL | 1 refills | Status: DC | PRN

## 2023-08-24 MED ORDER — CEFADROXIL 500 MG PO CAPS: 1000.0000 mg | ORAL_CAPSULE | Freq: Two times a day (BID) | ORAL | 1 refills | Status: AC

## 2023-08-24 NOTE — Progress Notes (Signed)
 Patient ID: Sharon Faulkner, female   DOB: 07/06/1976, 48 y.o.   MRN: [id number]  HPI Sharon Faulkner is a 48 yo F who had Right knee group b strep septic arthritis of her native knee, had wash out by dr beverley on 9/3 and then [employer] all poly tibia and CR femur with abx cement  implant on 9/4. She received iv abtx then transition to duricef for which she is Doing well. Sees [facility] feb 3rd  Outpatient Encounter Medications as of 08/24/2023  Medication Sig   acetaminophen  (TYLENOL ) 500 MG tablet Take 2 tablets (1,000 mg total) by mouth in the morning, at noon, and at bedtime.   albuterol  (VENTOLIN  HFA) 108 (90 Base) MCG/ACT inhaler Inhale 2 puffs into the lungs every 6 (six) hours as needed for wheezing or shortness of breath.   amLODipine  (NORVASC ) 5 MG tablet Take 1 tablet (5 mg total) by mouth daily.   ferrous sulfate  325 (65 FE) MG tablet Take 1 tablet (325 mg total) by mouth every other day.   fluconazole  (DIFLUCAN ) 150 MG tablet TAKE 1 TABLET BY MOUTH DAILY. IF NEEDED EVERY 3-5 DAYS IF YEAST INFECTION   meloxicam  (MOBIC ) 15 MG tablet Take 15 mg by mouth daily as needed.   methocarbamol  (ROBAXIN ) 500 MG tablet Take 1 tablet (500 mg total) by mouth every 6 (six) hours as needed for muscle spasms.   naproxen  sodium (ALEVE ) 220 MG tablet Take 1 tablet (220 mg total) by mouth 2 (two) times daily with a meal.   cefadroxil  (DURICEF) 500 MG capsule TAKE 2 CAPSULES BY MOUTH TWICE A DAY (Patient not taking: Reported on 08/24/2023)   No facility-administered encounter medications on file as of 08/24/2023.     Patient Active Problem List   Diagnosis Date Noted   Primary hypertension 05/07/2023   Concern for acute osteomyelitis of femoal condyle and tibial plateau (HCC) 04/19/2023   Obesity, Class III, BMI 40-49.9 (morbid obesity) (HCC) 04/19/2023   Asthma 04/09/2023   Acute on chronic anemia 04/09/2023   Septic arthritis of knee, 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 (HCC) 04/08/2023   Microcytic  anemia 07/17/2020   Acute respiratory failure due to COVID-19 [facility]) 07/16/2020     Health Maintenance Due  Topic Date Due   COVID-19 Vaccine (1) Never done   Pneumococcal Vaccine 89-17 Years old (1 of 2 - PCV) Never done   Hepatitis C Screening  Never done   Cervical Cancer Screening (HPV/Pap Cotest)  Never done   Colonoscopy  Never done     Review of Systems 12 point ros is negative except still slight knee swelling Physical Exam   BP (!) 178/123   Pulse 80   Resp 16   Ht 5' 4 (1.626 m)   Wt 291 lb 3.2 oz (132.1 kg)   LMP 07/24/2023 (Approximate)   BMI 49.98 kg/m    Physical Exam  Constitutional:  oriented to person, place, and time. appears well-developed and well-nourished. No distress.  HENT: [place]/AT, PERRLA, no scleral icterus Mouth/Throat: Oropharynx is clear and moist. No oropharyngeal exudate.  Cardiovascular: Normal rate, regular rhythm and normal heart sounds. Exam reveals no gallop and no friction rub.  No murmur heard.  Pulmonary/Chest: Effort normal and breath sounds normal. No respiratory distress.  has no wheezes.  Neck = supple, no nuchal rigidity Abdominal: Soft. Bowel sounds are normal.  exhibits no distension. There is no tenderness.  Lymphadenopathy: no cervical adenopathy. No axillary adenopathy Neurological: alert and oriented to person, place,  and time.  Skin: Skin is warm and dry. No rash noted. No erythema.  Psychiatric: a normal mood and affect.  behavior is normal.    CBC Lab Results  Component Value Date   WBC 8.7 05/21/2023   RBC 4.18 05/21/2023   HGB 9.3 (L) 05/21/2023   HCT 32.1 (L) 05/21/2023   PLT 378 05/21/2023   MCV 76.8 (L) 05/21/2023   MCH 22.2 (L) 05/21/2023   MCHC 29.0 (L) 05/21/2023   RDW 19.8 (H) 05/21/2023   LYMPHSABS 1,531 05/21/2023   MONOABS 0.7 04/19/2023   EOSABS 531 (H) 05/21/2023    BMET Lab Results  Component Value Date   NA 140 05/21/2023   K 4.2 05/21/2023   CL 104 05/21/2023   CO2 27 05/21/2023    GLUCOSE 100 (H) 05/21/2023   BUN 8 05/21/2023   CREATININE 0.54 05/21/2023   CALCIUM 8.9 05/21/2023   GFRNONAA >60 04/19/2023    Lab Results  Component Value Date   ESRSEDRATE 33 (H) 05/21/2023    Lab Results  Component Value Date   ESRSEDRATE 6 08/24/2023   Lab Results  Component Value Date   CRP 3.7 08/24/2023     Assessment and Plan Group b strep pji = will check labs to see that she is still showing improved inflammatory markers. Continue with duricef for additional 2 months to complete course  Yeast infection = now improved  Oa pain = Refill robaxin  Continue on cefadroxil  for 6-8wk.  Hypertension= Recheck bp

## 2023-08-25 LAB — C-REACTIVE PROTEIN: CRP: 3.7 mg/L (ref <8.0)

## 2023-08-25 LAB — SEDIMENTATION RATE: Sed Rate: 6 mm/h (ref 0–20)

## 2023-08-26 MED ORDER — HYDROCODONE-ACETAMINOPHEN 5-325 MG PO TABS: 1.0000 | ORAL_TABLET | Freq: Two times a day (BID) | ORAL | 0 refills | Status: DC | PRN

## 2023-08-26 NOTE — Addendum Note (Signed)
 Addended by: Marchell Froman on: 08/26/2023 03:05 PM   Modules accepted: Orders

## 2023-08-28 ENCOUNTER — Telehealth: Payer: Self-pay

## 2023-08-28 NOTE — Telephone Encounter (Signed)
PA submitted for hydrocodone  Marlyce Huge (Key: [id number])

## 2023-08-28 NOTE — Therapy (Unsigned)
OUTPATIENT PHYSICAL THERAPY TREATMENT NOTE   Patient Name: Sharon Faulkner MRN: [id number] DOB:1976/02/27, 48 y.o., female Today's Date: 08/31/2023  END OF SESSION:  PT End of Session - 08/31/23 1654     Visit Number 8    Number of Visits 13    Date for PT Re-Evaluation 08/22/23    Authorization Type MCD    PT Start Time 1650    PT Stop Time 1730    PT Time Calculation (min) 40 min    Activity Tolerance Patient tolerated treatment well    Behavior During Therapy [facility] for tasks assessed/performed                 Past Medical History:  Diagnosis Date   Asthma    Past Surgical History:  Procedure Laterality Date   INCISION AND DRAINAGE ABSCESS Right 04/14/2023   Procedure: INCISION AND DRAINAGE RIGHT KNEE;  Surgeon: Sheral Apley, MD;  Location: [facility];  Service: Orthopedics;  Laterality: Right;   KNEE ARTHROSCOPY Right 04/09/2023   Procedure: ARTHROSCOPY KNEE RIGHT;  Surgeon: Sheral Apley, MD;  Location: [facility];  Service: Orthopedics;  Laterality: Right;   TOTAL KNEE ARTHROPLASTY Right 04/14/2023   Procedure: TOTAL KNEE ARTHROPLASTY;  Surgeon: Sheral Apley, MD;  Location: [facility];  Service: Orthopedics;  Laterality: Right;   Patient Active Problem List   Diagnosis Date Noted   Primary hypertension 05/07/2023   Concern for acute osteomyelitis of femoal condyle and tibial plateau (HCC) 04/19/2023   Obesity, Class III, BMI 40-49.9 (morbid obesity) (HCC) 04/19/2023   Asthma 04/09/2023   Acute on chronic anemia 04/09/2023   Septic arthritis of knee, 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  Most symptoms occur following prolonged positioning.     PERTINENT HISTORY: HPI Sharon Faulkner is a 48 y.o. 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   She presents with ongoing discomfort despite attempts at pain management. She describes the pain as an 'aching' sensation that worsens after being up all day. The pain is so severe that it disrupts her sleep, causing her to describe her leg as 'running around the bed.' The pain also affects her mobility, causing her to shift her weight onto her other leg, which in turn causes hip and back pain.   The patient has tried various pain medications, including codeine and hydrocodone, but has had adverse reactions to both. She has also tried methocarbamol which has been ineffective.  At her last visit I had placed her on Duloxetine but she found this to be intolerable due to side effects such as nausea. She continues to follow-up with her orthopedic and she did have a visit with infectious disease on 05/2023 and still remains on her antibiotic infusion. PAIN:  Are you  having pain? Yes: NPRS scale: 10+/10 Pain location: R knee Pain description: ache, throb Aggravating factors: weightbearing, bending, movement Relieving factors: position changes, medication  PRECAUTIONS: Fall  RED FLAGS: None   WEIGHT BEARING RESTRICTIONS: No  FALLS:  Has patient fallen in last 6 months? No    OCCUPATION: not working  PLOF: Independent  PATIENT GOALS: To regain my knee function  NEXT MD VISIT: 06/25/23  OBJECTIVE:  Note: Objective measures were completed at  Evaluation unless otherwise noted.  DIAGNOSTIC FINDINGS: CLINICAL DATA:  Status post total right knee arthroplasty. Osteomyelitis of right knee region. Septic arthritis of right knee.   EXAM: PORTABLE RIGHT KNEE - 1-2 VIEW   COMPARISON:  Right knee radiographs 04/08/2023, MRI right knee 04/09/2023   FINDINGS: Interval total right knee arthroplasty with metallic femoral component and lower density tibial component. Antibiotic beads are seen throughout the tibial tunnel common femoral tunnel, and medial and suprapatellar compartment. Moderate joint effusion. Mild subcutaneous and intra-articular air. No cortical erosion is seen.   IMPRESSION: Interval total right knee arthroplasty, presumably for resection of the prior medial greater than lateral compartment articular surface septic arthritis/osteomyelitis, with antibiotic beads in place. Moderate joint effusion.     Electronically Signed   By: Neita Garnet M.D.   On: 04/14/2023 19:25  PATIENT SURVEYS:  FOTO 54(64 predicted)  08/24/23 42% (patient rates knee at 85%)  MUSCLE LENGTH: N/T  POSTURE: N/T  PALPATION: N/T  LOWER EXTREMITY ROM:  A/PROM Right eval Left eval Right 07/07/23 Right 07/20/23 R 08/24/23  Hip flexion       Hip extension       Hip abduction       Hip adduction       Hip internal rotation       Hip external rotation       Knee flexion 70/82d  83 AAROM 94 AAROM 102d  Knee extension -8/-4d    0d  Ankle dorsiflexion       Ankle plantarflexion       Ankle inversion       Ankle eversion        (Blank rows = not tested)  LOWER EXTREMITY MMT:  MMT Right eval Left eval R 08/24/23  Hip flexion 3+  4-  Hip extension 3+  4-  Hip abduction 3+  4-  Hip adduction     Hip internal rotation     Hip external rotation     Knee flexion 3+  4-  Knee extension 3  3+  Ankle dorsiflexion     Ankle plantarflexion 3+  4  Ankle inversion     Ankle eversion      (Blank rows = not tested)  LOWER  EXTREMITY SPECIAL TESTS: NT   FUNCTIONAL TESTS:  30 seconds chair stand test 3 reps; 08/24/23 8 reps  GAIT: Distance walked: 134ftx2 Assistive device utilized: Environmental consultant - 2 wheeled Level of assistance: Complete Independence Comments: slow antalgic gait   TODAY'S TREATMENT:  [facility] Adult PT Treatment:                                                DATE: 08/31/23 Therapeutic Exercise: NuStep L5 6 min FAQs w/ball 15x R SLR 15x 2# R SAQs 15x 2# Heel slides 15x2 (5 reps with PT OP) 105d TKE GTB 15x 5x STS  [facility] Adult PT Treatment:  DATE: 08/24/23 Therapeutic Exercise: Nustep L4 8 min Heel slides over board 15x2 102d flexion SLR 15x  Therapeutic Activity: 12 steps emphasizing step to pattern for safety 30s chair stand test 8 reps  [facility] Adult PT Treatment:                                                DATE: 08/10/2023  Therapeutic Exercise: NuStep L4 6 min R FAQs 2# 15x R QS 3s 15x R SLR 15x 2s 2# R SAQs 15x 2s Heel slides on plinth w/ stretch strap FAQ 2# 15x 2s Standing heel raises 15x  Manual Therapy: Manual knee flexion with distraction Patellar mobilization Desensitization training  Modalities: Ice pack with elevation  [facility] Adult PT Treatment:                                                DATE: 08/03/23 Therapeutic Exercise: NuStep L4 6 min R FAQs 2# 15x R QS 3s 15x R SLR 15x 2s 2# R SAQs 15x 2s Heel slides over board w/strap 15x 98d FAQ 2# 15x 2s Standing heel raises 15x Standing ham curls Manual Therapy: STM into flexion in sitting   PATIENT EDUCATION:  Education details: Discussed eval findings, rehab rationale and POC and patient is in agreement  Person educated: Patient Education method: Explanation Education comprehension: verbalized understanding and needs further education  HOME EXERCISE PROGRAM: Access Code: [email or web address] URL: [email or web address] Date: 06/22/2023 Prepared by:  Gustavus Bryant  Exercises - Supine Knee Extension Strengthening  - 5 x daily - 5 x weekly - 1 sets - 10 reps - Supine Heel Slide with Strap  - 5 x daily - 5 x weekly - 1 sets - 10 reps - Supine Quad Set  - 5 x daily - 5 x weekly - 1 sets - 10 reps - 3s hold  ASSESSMENT:  CLINICAL IMPRESSION: Treatment emphasized knee flexion adding PT NAGs into flexion during heel slides.  Incorporated additional exercises to focus on TKE and VMO Activation.  Extensor lag resolved and    OBJECTIVE IMPAIRMENTS: Abnormal gait, decreased activity tolerance, decreased mobility, difficulty walking, decreased ROM, decreased strength, obesity, and pain.   ACTIVITY LIMITATIONS: carrying, lifting, sitting, standing, squatting, stairs, and locomotion level  PERSONAL FACTORS: Fitness, Past/current experiences, and 1 comorbidity: infection  are also affecting patient's functional outcome.   REHAB POTENTIAL: Good  CLINICAL DECISION MAKING: Evolving/moderate complexity  EVALUATION COMPLEXITY: Low   GOALS: Goals reviewed with patient? No  SHORT TERM GOALS: Target date: 07/13/2023   Patient to demonstrate independence in HEP  Baseline: [email or web address] Goal status: Met  2.  Increase R knee flexion to 90d Baseline: 82d; 07/13/23 95d Goal status: Met  3.  Able to perform SLR w/o extensor lag Baseline: Extensor lag present; 08/31/23 No extensor lag Goal status: Ongoing   LONG TERM GOALS: Target date: 08/03/2023    Patient will score at least 64% on FOTO to signify clinically meaningful improvement in functional abilities.   Baseline: 54%; 08/24/23 42% Goal status: Ongoing  2.  Patient will increase 30s chair stand reps from 3 to 6 with/without arms to demonstrate and improved functional ability with less pain/difficulty as well as reduce fall risk.  Baseline: 3; 08/24/23 8 reps  Goal status: Met  3.  Patient will acknowledge 6/10 pain at least once during episode of care   Baseline: 10/10 Goal status:  INITIAL  4.  254ft ambulation with LRAD Baseline: 175ft with RW; 08/24/23 Unrestricted w/o AD Goal status: Met  5.  Patient to negotiate 16 steps with most appropriate pattern Baseline: TBD; 08/24/23 12 steps with B rails and step to pattern Goal status: Ongoing  6.  Increase R knee flexion to 110d Baseline: 82d; 08/24/23 102 Goal status: Ongoing   PLAN:  PT FREQUENCY: 1x/week  PT DURATION: 6 weeks  PLANNED INTERVENTIONS: 97164- PT Re-evaluation, 97110-Therapeutic exercises, 97530- Therapeutic activity, 97112- Neuromuscular re-education, 97535- Self Care, 65784- Manual therapy, 97116- Gait training, 97014- Electrical stimulation (unattended), Patient/Family education, Balance training, Stair training, Joint mobilization, Cryotherapy, and Moist heat  PLAN FOR NEXT SESSION: HEP review and update, manual techniques as appropriate, aerobic tasks, ROM and flexibility activities, strengthening and PREs, TPDN, gait and balance training as needed   For all possible CPT codes, reference the Planned Interventions line above.     Check all conditions that are expected to impact treatment: {Conditions expected to impact treatment:Unknown   If treatment provided at initial evaluation, no treatment charged due to lack of authorization.      Learta Codding PT  08/31/2023, 5:34 PM

## 2023-08-31 ENCOUNTER — Ambulatory Visit: Payer: Medicaid Other

## 2023-08-31 DIAGNOSIS — M25561 Pain in right knee: Secondary | ICD-10-CM

## 2023-08-31 DIAGNOSIS — Z96651 Presence of right artificial knee joint: Secondary | ICD-10-CM

## 2023-08-31 DIAGNOSIS — M6281 Muscle weakness (generalized): Secondary | ICD-10-CM

## 2023-09-03 NOTE — Therapy (Signed)
OUTPATIENT PHYSICAL THERAPY TREATMENT NOTE   Patient Name: Sharon Faulkner MRN: [id number] DOB:[birth year], 48 y.o., female Today's Date: 09/07/2023  END OF SESSION:  PT End of Session - 09/07/23 1448     Visit Number 9    Number of Visits 13    Date for PT Re-Evaluation 08/22/23    PT Start Time 1445    PT Stop Time 1525    PT Time Calculation (min) 40 min    Activity Tolerance Patient tolerated treatment well    Behavior During Therapy [facility] for tasks assessed/performed                  Past Medical History:  Diagnosis Date   Asthma    Past Surgical History:  Procedure Laterality Date   INCISION AND DRAINAGE ABSCESS Right 04/14/2023   Procedure: INCISION AND DRAINAGE RIGHT KNEE;  Surgeon: Sharon Apley, MD;  Location: [facility];  Service: Orthopedics;  Laterality: Right;   KNEE ARTHROSCOPY Right 04/09/2023   Procedure: ARTHROSCOPY KNEE RIGHT;  Surgeon: Sharon Apley, MD;  Location: [facility] OR;  Service: Orthopedics;  Laterality: Right;   TOTAL KNEE ARTHROPLASTY Right 04/14/2023   Procedure: TOTAL KNEE ARTHROPLASTY;  Surgeon: Sharon Apley, MD;  Location: [facility];  Service: Orthopedics;  Laterality: Right;   Patient Active Problem List   Diagnosis Date Noted   Primary hypertension 05/07/2023   Concern for acute osteomyelitis of femoal condyle and tibial plateau (HCC) 04/19/2023   Obesity, Class III, BMI 40-49.9 (morbid obesity) (HCC) 04/19/2023   Asthma 04/09/2023   Acute on chronic anemia 04/09/2023   Septic arthritis of knee, 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  Despite discomfort R knee no worse than usual.   PERTINENT HISTORY: HPI Sharon Faulkner is a 48 y.o. 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   She presents with ongoing discomfort despite attempts at pain management. She describes the pain as an 'aching' sensation that worsens after being up all day. The pain is so severe that it disrupts her sleep, causing her to describe her leg as 'running around the bed.' The pain also affects her mobility, causing her to shift her weight onto her other leg, which in turn causes hip and back pain.   The patient has tried various pain medications, including codeine and hydrocodone, but has had adverse reactions to both. She has also tried methocarbamol which has been ineffective.  At her last visit I had placed her on Duloxetine but she found this to be intolerable due to side effects such as nausea. She continues to follow-up with her orthopedic and she did have a visit with infectious disease on 05/2023 and still remains on her antibiotic infusion. PAIN:  Are you having pain? Yes:  NPRS scale: 10+/10 Pain location: R knee Pain description: ache, throb Aggravating factors: weightbearing, bending, movement Relieving factors: position changes, medication  PRECAUTIONS: Fall  RED FLAGS: None   WEIGHT BEARING RESTRICTIONS: No  FALLS:  Has patient fallen in last 6 months? No    OCCUPATION: not working  PLOF: Independent  PATIENT GOALS: To regain my knee function  NEXT MD VISIT: 06/25/23  OBJECTIVE:  Note: Objective measures were completed at  Evaluation unless otherwise noted.  DIAGNOSTIC FINDINGS: CLINICAL DATA:  Status post total right knee arthroplasty. Osteomyelitis of right knee region. Septic arthritis of right knee.   EXAM: PORTABLE RIGHT KNEE - 1-2 VIEW   COMPARISON:  Right knee radiographs 04/08/2023, MRI right knee 04/09/2023   FINDINGS: Interval total right knee arthroplasty with metallic femoral component and lower density tibial component. Antibiotic beads are seen throughout the tibial tunnel common femoral tunnel, and medial and suprapatellar compartment. Moderate joint effusion. Mild subcutaneous and intra-articular air. No cortical erosion is seen.   IMPRESSION: Interval total right knee arthroplasty, presumably for resection of the prior medial greater than lateral compartment articular surface septic arthritis/osteomyelitis, with antibiotic beads in place. Moderate joint effusion.     Electronically Signed   By: Sharon Faulkner M.D.   On: 04/14/2023 19:25  PATIENT SURVEYS:  FOTO 54(64 predicted)  08/24/23 42% (patient rates knee at 85%)  MUSCLE LENGTH: N/T  POSTURE: N/T  PALPATION: N/T  LOWER EXTREMITY ROM:  A/PROM Right eval Left eval Right 07/07/23 Right 07/20/23 R 08/24/23  Hip flexion       Hip extension       Hip abduction       Hip adduction       Hip internal rotation       Hip external rotation       Knee flexion 70/82d  83 AAROM 94 AAROM 102d  Knee extension -8/-4d    0d  Ankle dorsiflexion       Ankle plantarflexion       Ankle inversion       Ankle eversion        (Blank rows = not tested)  LOWER EXTREMITY MMT:  MMT Right eval Left eval R 08/24/23  Hip flexion 3+  4-  Hip extension 3+  4-  Hip abduction 3+  4-  Hip adduction     Hip internal rotation     Hip external rotation     Knee flexion 3+  4-  Knee extension 3  3+  Ankle dorsiflexion     Ankle plantarflexion 3+  4  Ankle inversion     Ankle eversion      (Blank rows = not tested)  LOWER  EXTREMITY SPECIAL TESTS: NT   FUNCTIONAL TESTS:  30 seconds chair stand test 3 reps; 08/24/23 8 reps  GAIT: Distance walked: 162ftx2 Assistive device utilized: Environmental consultant - 2 wheeled Level of assistance: Complete Independence Comments: slow antalgic gait   TODAY'S TREATMENT:  [facility] Adult PT Treatment:                                                DATE: 09/07/23 Therapeutic Exercise: Nustep L2 8 min FAQs w/ball 15x2 R SLR 15x 2# R SAQs 15x2 3# Heel slides 15x2 (5 reps with PT OP) 108-110d TKE GTB 15x Supine hip fallouts GTB 15x2   [facility] Adult PT  Treatment:                                                DATE: 08/31/23 Therapeutic Exercise: NuStep L5 6 min FAQs w/ball 15x R SLR 15x 2# R SAQs 15x 2# Heel slides 15x2 (5 reps with PT OP) 105d TKE GTB 15x 5x STS  [facility] Adult PT Treatment:                                                DATE: 08/24/23 Therapeutic Exercise: Nustep L4 8 min Heel slides over board 15x2 102d flexion SLR 15x  Therapeutic Activity: 12 steps emphasizing step to pattern for safety 30s chair stand test 8 reps  [facility] Adult PT Treatment:                                                DATE: 08/10/2023  Therapeutic Exercise: NuStep L4 6 min R FAQs 2# 15x R QS 3s 15x R SLR 15x 2s 2# R SAQs 15x 2s Heel slides on plinth w/ stretch strap FAQ 2# 15x 2s Standing heel raises 15x  Manual Therapy: Manual knee flexion with distraction Patellar mobilization Desensitization training  Modalities: Ice pack with elevation  [facility] Adult PT Treatment:                                                DATE: 08/03/23 Therapeutic Exercise: NuStep L4 6 min R FAQs 2# 15x R QS 3s 15x R SLR 15x 2s 2# R SAQs 15x 2s Heel slides over board w/strap 15x 98d FAQ 2# 15x 2s Standing heel raises 15x Standing ham curls Manual Therapy: STM into flexion in sitting   PATIENT EDUCATION:  Education details: Discussed eval findings, rehab rationale and POC and patient is in  agreement  Person educated: Patient Education method: Explanation Education comprehension: verbalized understanding and needs further education  HOME EXERCISE PROGRAM: Access Code: [email or web address] URL: [email or web address] Date: 06/22/2023 Prepared by: Gustavus Bryant  Exercises - Supine Knee Extension Strengthening  - 5 x daily - 5 x weekly - 1 sets - 10 reps - Supine Heel Slide with Strap  - 5 x daily - 5 x weekly - 1 sets - 10 reps - Supine Quad Set  - 5 x daily - 5 x weekly - 1 sets - 10 reps - 3s hold  ASSESSMENT:  CLINICAL IMPRESSION: Due to increased level of discomfort, treatment focus was mobility and ROM with emphasis on knee flexion.  Increased weight as noted.  Despite increased soreness patient able to achieve 108d flexion.  No increased symptoms reported following today's session.  Treatment emphasized knee flexion adding PT NAGs into flexion during heel slides.  Incorporated additional exercises to focus on TKE and VMO Activation.  Extensor lag resolved and    OBJECTIVE IMPAIRMENTS: Abnormal gait, decreased activity tolerance, decreased mobility, difficulty walking, decreased ROM, decreased strength, obesity, and pain.   ACTIVITY LIMITATIONS: carrying, lifting, sitting, standing, squatting, stairs,  and locomotion level  PERSONAL FACTORS: Fitness, Past/current experiences, and 1 comorbidity: infection  are also affecting patient's functional outcome.   REHAB POTENTIAL: Good  CLINICAL DECISION MAKING: Evolving/moderate complexity  EVALUATION COMPLEXITY: Low   GOALS: Goals reviewed with patient? No  SHORT TERM GOALS: Target date: 07/13/2023   Patient to demonstrate independence in HEP  Baseline: [email or web address] Goal status: Met  2.  Increase R knee flexion to 90d Baseline: 82d; 07/13/23 95d Goal status: Met  3.  Able to perform SLR w/o extensor lag Baseline: Extensor lag present; 08/31/23 No extensor lag Goal status: Met   LONG TERM GOALS: Target date:  08/03/2023    Patient will score at least 64% on FOTO to signify clinically meaningful improvement in functional abilities.   Baseline: 54%; 08/24/23 42% Goal status: Ongoing  2.  Patient will increase 30s chair stand reps from 3 to 6 with/without arms to demonstrate and improved functional ability with less pain/difficulty as well as reduce fall risk.  Baseline: 3; 08/24/23 8 reps Goal status: Met  3.  Patient will acknowledge 6/10 pain at least once during episode of care   Baseline: 10/10 Goal status: INITIAL  4.  239ft ambulation with LRAD Baseline: 131ft with RW; 08/24/23 Unrestricted w/o AD Goal status: Met  5.  Patient to negotiate 16 steps with most appropriate pattern Baseline: TBD; 08/24/23 12 steps with B rails and step to pattern Goal status: Ongoing  6.  Increase R knee flexion to 110d Baseline: 82d; 08/24/23 102 Goal status: Ongoing   PLAN:  PT FREQUENCY: 1x/week  PT DURATION: 6 weeks  PLANNED INTERVENTIONS: 97164- PT Re-evaluation, 97110-Therapeutic exercises, 97530- Therapeutic activity, 97112- Neuromuscular re-education, 97535- Self Care, 82956- Manual therapy, 97116- Gait training, 97014- Electrical stimulation (unattended), Patient/Family education, Balance training, Stair training, Joint mobilization, Cryotherapy, and Moist heat  PLAN FOR NEXT SESSION: HEP review and update, manual techniques as appropriate, aerobic tasks, ROM and flexibility activities, strengthening and PREs, TPDN, gait and balance training as needed   For all possible CPT codes, reference the Planned Interventions line above.     Check all conditions that are expected to impact treatment: {Conditions expected to impact treatment:Unknown   If treatment provided at initial evaluation, no treatment charged due to lack of authorization.      Learta Codding PT  09/07/2023, 3:27 PM

## 2023-09-07 ENCOUNTER — Ambulatory Visit: Payer: Medicaid Other

## 2023-09-07 DIAGNOSIS — M6281 Muscle weakness (generalized): Secondary | ICD-10-CM

## 2023-09-07 DIAGNOSIS — Z96651 Presence of right artificial knee joint: Secondary | ICD-10-CM

## 2023-09-07 DIAGNOSIS — M25561 Pain in right knee: Secondary | ICD-10-CM | POA: Diagnosis not present

## 2023-09-08 ENCOUNTER — Ambulatory Visit: Payer: Medicaid Other | Admitting: Family Medicine

## 2023-09-10 NOTE — Therapy (Deleted)
 OUTPATIENT PHYSICAL THERAPY TREATMENT NOTE   Patient Name: Sharon Faulkner MRN: [id number] DOB:1975-11-19, 48 y.o., female Today's Date: 09/10/2023  END OF SESSION:         Past Medical History:  Diagnosis Date   Asthma    Past Surgical History:  Procedure Laterality Date   INCISION AND DRAINAGE ABSCESS Right 04/14/2023   Procedure: INCISION AND DRAINAGE RIGHT KNEE;  Surgeon: Sheral Apley, MD;  Location: [facility];  Service: Orthopedics;  Laterality: Right;   KNEE ARTHROSCOPY Right 04/09/2023   Procedure: ARTHROSCOPY KNEE RIGHT;  Surgeon: Sheral Apley, MD;  Location: [facility] OR;  Service: Orthopedics;  Laterality: Right;   TOTAL KNEE ARTHROPLASTY Right 04/14/2023   Procedure: TOTAL KNEE ARTHROPLASTY;  Surgeon: Sheral Apley, MD;  Location: [facility];  Service: Orthopedics;  Laterality: Right;   Patient Active Problem List   Diagnosis Date Noted   Primary hypertension 05/07/2023   Concern for acute osteomyelitis of femoal condyle and tibial plateau (HCC) 04/19/2023   Obesity, Class III, BMI 40-49.9 (morbid obesity) (HCC) 04/19/2023   Asthma 04/09/2023   Acute on chronic anemia 04/09/2023   Septic arthritis of knee, 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  Rationale for Evaluation and Treatment: Rehabilitation  ONSET DATE: 04/15/23  SUBJECTIVE:   SUBJECTIVE STATEMENT: Reports full body soreness as she was moving furniture.  Despite discomfort R knee no worse than usual.   PERTINENT HISTORY: HPI Sharon Faulkner is a 48 y.o. 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   She presents with ongoing discomfort despite attempts at pain management. She describes the pain as an 'aching' sensation that worsens after being up all day. The pain is so severe that it disrupts her sleep, causing her to describe her leg as 'running around the bed.' The pain also affects her mobility, causing her to shift her weight onto her other leg, which in turn causes hip and back pain.   The patient has tried various pain medications, including codeine and hydrocodone, but has had adverse reactions to both. She has also tried methocarbamol which has been ineffective.  At her last visit I had placed her on Duloxetine but she found this to be intolerable due to side effects such as nausea. She continues to follow-up with her orthopedic and she did have a visit with infectious disease on 05/2023 and still remains on her antibiotic infusion. PAIN:  Are you having pain? Yes: NPRS scale: 10+/10 Pain location: R knee Pain description: ache, throb Aggravating factors: weightbearing, bending, movement Relieving factors: position changes, medication  PRECAUTIONS: Fall  RED FLAGS: None   WEIGHT BEARING RESTRICTIONS: No  FALLS:  Has patient fallen in last 6 months? No    OCCUPATION: not working  PLOF: Independent  PATIENT GOALS: To regain my knee function  NEXT MD VISIT: 06/25/23  OBJECTIVE:  Note: Objective measures were completed at Evaluation unless otherwise noted.  DIAGNOSTIC FINDINGS: CLINICAL DATA:  Status post total right knee arthroplasty. Osteomyelitis of  right knee region. Septic arthritis of right knee.   EXAM: PORTABLE RIGHT KNEE - 1-2 VIEW   COMPARISON:  Right knee radiographs 04/08/2023, MRI right knee 04/09/2023   FINDINGS: Interval total right knee arthroplasty with metallic femoral component and lower density tibial  component. Antibiotic beads are seen throughout the tibial tunnel common femoral tunnel, and medial and suprapatellar compartment. Moderate joint effusion. Mild subcutaneous and intra-articular air. No cortical erosion is seen.   IMPRESSION: Interval total right knee arthroplasty, presumably for resection of the prior medial greater than lateral compartment articular surface septic arthritis/osteomyelitis, with antibiotic beads in place. Moderate joint effusion.     Electronically Signed   By: Neita Garnet M.D.   On: 04/14/2023 19:25  PATIENT SURVEYS:  FOTO 54(64 predicted)  08/24/23 42% (patient rates knee at 85%)  MUSCLE LENGTH: N/T  POSTURE: N/T  PALPATION: N/T  LOWER EXTREMITY ROM:  A/PROM Right eval Left eval Right 07/07/23 Right 07/20/23 R 08/24/23  Hip flexion       Hip extension       Hip abduction       Hip adduction       Hip internal rotation       Hip external rotation       Knee flexion 70/82d  83 AAROM 94 AAROM 102d  Knee extension -8/-4d    0d  Ankle dorsiflexion       Ankle plantarflexion       Ankle inversion       Ankle eversion        (Blank rows = not tested)  LOWER EXTREMITY MMT:  MMT Right eval Left eval R 08/24/23  Hip flexion 3+  4-  Hip extension 3+  4-  Hip abduction 3+  4-  Hip adduction     Hip internal rotation     Hip external rotation     Knee flexion 3+  4-  Knee extension 3  3+  Ankle dorsiflexion     Ankle plantarflexion 3+  4  Ankle inversion     Ankle eversion      (Blank rows = not tested)  LOWER EXTREMITY SPECIAL TESTS: NT   FUNCTIONAL TESTS:  30 seconds chair stand test 3 reps; 08/24/23 8 reps  GAIT: Distance walked: 170ftx2 Assistive device utilized: Environmental consultant - 2 wheeled Level of assistance: Complete Independence Comments: slow antalgic gait   TODAY'S TREATMENT:  [facility] Adult PT Treatment:                                                DATE: 09/07/23 Therapeutic Exercise: Nustep L2 8 min FAQs w/ball  15x2 R SLR 15x 2# R SAQs 15x2 3# Heel slides 15x2 (5 reps with PT OP) 108-110d TKE GTB 15x Supine hip fallouts GTB 15x2   [facility] Adult PT Treatment:                                                DATE: 08/31/23 Therapeutic Exercise: NuStep L5 6 min FAQs w/ball 15x R SLR 15x 2# R SAQs 15x 2# Heel slides 15x2 (5 reps with PT OP) 105d TKE GTB 15x 5x STS  [facility] Adult PT Treatment:  DATE: 08/24/23 Therapeutic Exercise: Nustep L4 8 min Heel slides over board 15x2 102d flexion SLR 15x  Therapeutic Activity: 12 steps emphasizing step to pattern for safety 30s chair stand test 8 reps  [facility] Adult PT Treatment:                                                DATE: 08/10/2023  Therapeutic Exercise: NuStep L4 6 min R FAQs 2# 15x R QS 3s 15x R SLR 15x 2s 2# R SAQs 15x 2s Heel slides on plinth w/ stretch strap FAQ 2# 15x 2s Standing heel raises 15x  Manual Therapy: Manual knee flexion with distraction Patellar mobilization Desensitization training  Modalities: Ice pack with elevation  [facility] Adult PT Treatment:                                                DATE: 08/03/23 Therapeutic Exercise: NuStep L4 6 min R FAQs 2# 15x R QS 3s 15x R SLR 15x 2s 2# R SAQs 15x 2s Heel slides over board w/strap 15x 98d FAQ 2# 15x 2s Standing heel raises 15x Standing ham curls Manual Therapy: STM into flexion in sitting   PATIENT EDUCATION:  Education details: Discussed eval findings, rehab rationale and POC and patient is in agreement  Person educated: Patient Education method: Explanation Education comprehension: verbalized understanding and needs further education  HOME EXERCISE PROGRAM: Access Code: [email or web address] URL: [email or web address] Date: 06/22/2023 Prepared by: Gustavus Bryant  Exercises - Supine Knee Extension Strengthening  - 5 x daily - 5 x weekly - 1 sets - 10 reps - Supine Heel Slide with Strap  - 5 x daily - 5  x weekly - 1 sets - 10 reps - Supine Quad Set  - 5 x daily - 5 x weekly - 1 sets - 10 reps - 3s hold  ASSESSMENT:  CLINICAL IMPRESSION: Due to increased level of discomfort, treatment focus was mobility and ROM with emphasis on knee flexion.  Increased weight as noted.  Despite increased soreness patient able to achieve 108d flexion.  No increased symptoms reported following today's session.  Treatment emphasized knee flexion adding PT NAGs into flexion during heel slides.  Incorporated additional exercises to focus on TKE and VMO Activation.  Extensor lag resolved and    OBJECTIVE IMPAIRMENTS: Abnormal gait, decreased activity tolerance, decreased mobility, difficulty walking, decreased ROM, decreased strength, obesity, and pain.   ACTIVITY LIMITATIONS: carrying, lifting, sitting, standing, squatting, stairs, and locomotion level  PERSONAL FACTORS: Fitness, Past/current experiences, and 1 comorbidity: infection  are also affecting patient's functional outcome.   REHAB POTENTIAL: Good  CLINICAL DECISION MAKING: Evolving/moderate complexity  EVALUATION COMPLEXITY: Low   GOALS: Goals reviewed with patient? No  SHORT TERM GOALS: Target date: 07/13/2023   Patient to demonstrate independence in HEP  Baseline: [email or web address] Goal status: Met  2.  Increase R knee flexion to 90d Baseline: 82d; 07/13/23 95d Goal status: Met  3.  Able to perform SLR w/o extensor lag Baseline: Extensor lag present; 08/31/23 No extensor lag Goal status: Met   LONG TERM GOALS: Target date: 08/03/2023    Patient will score at least 64% on FOTO to signify clinically meaningful improvement in functional abilities.   Baseline: 54%; 08/24/23  42% Goal status: Ongoing  2.  Patient will increase 30s chair stand reps from 3 to 6 with/without arms to demonstrate and improved functional ability with less pain/difficulty as well as reduce fall risk.  Baseline: 3; 08/24/23 8 reps Goal status: Met  3.  Patient will  acknowledge 6/10 pain at least once during episode of care   Baseline: 10/10 Goal status: INITIAL  4.  247ft ambulation with LRAD Baseline: 146ft with RW; 08/24/23 Unrestricted w/o AD Goal status: Met  5.  Patient to negotiate 16 steps with most appropriate pattern Baseline: TBD; 08/24/23 12 steps with B rails and step to pattern Goal status: Ongoing  6.  Increase R knee flexion to 110d Baseline: 82d; 08/24/23 102 Goal status: Ongoing   PLAN:  PT FREQUENCY: 1x/week  PT DURATION: 6 weeks  PLANNED INTERVENTIONS: 97164- PT Re-evaluation, 97110-Therapeutic exercises, 97530- Therapeutic activity, 97112- Neuromuscular re-education, 97535- Self Care, 16109- Manual therapy, 97116- Gait training, 97014- Electrical stimulation (unattended), Patient/Family education, Balance training, Stair training, Joint mobilization, Cryotherapy, and Moist heat  PLAN FOR NEXT SESSION: HEP review and update, manual techniques as appropriate, aerobic tasks, ROM and flexibility activities, strengthening and PREs, TPDN, gait and balance training as needed   For all possible CPT codes, reference the Planned Interventions line above.     Check all conditions that are expected to impact treatment: {Conditions expected to impact treatment:Unknown   If treatment provided at initial evaluation, no treatment charged due to lack of authorization.      Learta Codding PT  09/10/2023, 1:42 PM

## 2023-09-11 ENCOUNTER — Encounter: Payer: Medicaid Other | Admitting: Physical Medicine & Rehabilitation

## 2023-09-11 ENCOUNTER — Encounter: Payer: Self-pay | Admitting: Physical Medicine & Rehabilitation

## 2023-09-11 VITALS — BP 157/104 | HR 86 | Ht 64.0 in | Wt 293.0 lb

## 2023-09-11 DIAGNOSIS — M25552 Pain in left hip: Secondary | ICD-10-CM

## 2023-09-11 DIAGNOSIS — G894 Chronic pain syndrome: Secondary | ICD-10-CM | POA: Diagnosis not present

## 2023-09-11 DIAGNOSIS — Z5181 Encounter for therapeutic drug level monitoring: Secondary | ICD-10-CM | POA: Diagnosis not present

## 2023-09-11 DIAGNOSIS — M545 Low back pain, unspecified: Secondary | ICD-10-CM

## 2023-09-11 DIAGNOSIS — Z79899 Other long term (current) drug therapy: Secondary | ICD-10-CM | POA: Diagnosis not present

## 2023-09-11 DIAGNOSIS — M25561 Pain in right knee: Secondary | ICD-10-CM | POA: Diagnosis not present

## 2023-09-11 DIAGNOSIS — G8929 Other chronic pain: Secondary | ICD-10-CM

## 2023-09-11 DIAGNOSIS — Z96651 Presence of right artificial knee joint: Secondary | ICD-10-CM

## 2023-09-11 MED ORDER — HYDROCODONE-ACETAMINOPHEN 5-325 MG PO TABS: 1.0000 | ORAL_TABLET | Freq: Two times a day (BID) | ORAL | 0 refills | Status: DC | PRN

## 2023-09-11 NOTE — Progress Notes (Signed)
Subjective:    Patient ID: Sharon Faulkner, female    DOB: Aug 15, 1975, 48 y.o.   MRN: [id number]  HPI   HPI  Sharon Faulkner is a 48 y.o. year old female  who  has a past medical history of Asthma.   They are presenting to PM&R clinic as a new patient for pain management evaluation. They were referred by PA Levester Fresh for treatment of R knee pain.  Patient reports she developed knee pain around July 2024.  Right knee pain continue to progress to where she required a wheelchair for ambulation.  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  Patient was taken to the OR for irrigation and debridement by Dr. Dr. Margarita Rana 04/14/2023 but ultimately required TKA and antibiotic bead placement.  She has been working with physical therapy.  She continues to have severe pain in her right knee, although it is gradually improving.  During this time she has also developed milder pain in her left hip and lower back.  She feels like this is due to her altered gait.  She has been doing physical therapy for the last several weeks, reports this is helping her function.  Knee pain is less severe when she is sitting still but becomes very severe when ambulating.  Pain is sharp in quality.  Sometimes associated with muscle spasms in her shins.  Patient has been using meloxicam, this provides mild benefit but she reports she has to take more than 1 pill so really keep the pain at a manageable level.  Robaxin also helps her pain.  She is previously treated with oxycodone, hydrocodone, tramadol.  She reports tramadol did not help her pain.  Hydrocodone and oxycodone helped reduce her pain.  She denies any side effects with pain medications ending with "codon" but says that codeine caused her to itch.   Pt did report use of CBD product she purchased at a store recently.   Red flag symptoms: No red flags for back pain endorsed in Hx or ROS  Medications tried: Nsaids-  Meloxicam- helps a little  Tylenol  minimal benefit Opiates   Dilaudid- worked well  Oxycodone and Hydrocodone- helpful  Codeine- itching Tramadol- dizzy and sick Gabapentin- gabapentin didn't help  SNRIs  -  Duloxetine made her feel nauseate  Robaxin 500mg  makes her sleepy but does provide benefit  Other treatments: PT- Helping her function  TENs unit Has not tried Surgery RKA    Prior UDS results: No results found for: "LABOPIA", "COCAINSCRNUR", "LABBENZ", "AMPHETMU", "THCU", "LABBARB"   Interval History 09/11/23 Pt continues to have pain in her R knee. Norco helping to keep pain under control. She is using it sparingly.  No side effects with the medication. Continues to have occasional hip pain.  She is working with PT.   Pain Inventory Average Pain 10 Pain Right Now 5 My pain is sharp, burning, stabbing, aching, and tightness  In the last 24 hours, has pain interfered with the following? General activity 7 Relation with others 0 Enjoyment of life 10 What TIME of day is your pain at its worst? evening and night Sleep (in general) Poor  Pain is worse with: walking, bending, standing, and some activites Pain improves with: rest and medication Relief from Meds: 8    Any changes since last visit?  no    No family history on file. Social History   Socioeconomic History   Marital status: Single  Spouse name: Not on file   Number of children: Not on file   Years of education: Not on file   Highest education level: Not on file  Occupational History   Not on file  Tobacco Use   Smoking status: Every Day    Current packs/day: 0.50    Types: Cigarettes   Smokeless tobacco: Never  Substance and Sexual Activity   Alcohol use: Not on file    Comment: occasional   Drug use: No   Sexual activity: Yes  Other Topics Concern   Not on file  Social History Narrative   Not on file   Social Drivers of Health   Financial Resource Strain: High Risk (06/08/2023)    Overall Financial Resource Strain (CARDIA)    Difficulty of Paying Living Expenses: Hard  Food Insecurity: Food Insecurity Present (06/08/2023)   Hunger Vital Sign    Worried About Running Out of Food in the Last Year: Often true    Ran Out of Food in the Last Year: Sometimes true  Transportation Needs: No Transportation Needs (06/08/2023)   PRAPARE - Administrator, Civil Service (Medical): No    Lack of Transportation (Non-Medical): No  Physical Activity: Inactive (06/08/2023)   Exercise Vital Sign    Days of Exercise per Week: 0 days    Minutes of Exercise per Session: 0 min  Stress: Stress Concern Present (06/08/2023)   [employer] of Occupational Health - Occupational Stress Questionnaire    Feeling of Stress : To some extent  Social Connections: Unknown (06/08/2023)   Social Connection and Isolation Panel [NHANES]    Frequency of Communication with Friends and Family: Three times a week    Frequency of Social Gatherings with Friends and Family: Once a week    Attends Religious Services: Never    Database administrator or Organizations: No    Attends Engineer, structural: Never    Marital Status: Patient declined   Past Surgical History:  Procedure Laterality Date   INCISION AND DRAINAGE ABSCESS Right 04/14/2023   Procedure: INCISION AND DRAINAGE RIGHT KNEE;  Surgeon: Sheral Apley, MD;  Location: [facility];  Service: Orthopedics;  Laterality: Right;   KNEE ARTHROSCOPY Right 04/09/2023   Procedure: ARTHROSCOPY KNEE RIGHT;  Surgeon: Sheral Apley, MD;  Location: [facility] OR;  Service: Orthopedics;  Laterality: Right;   TOTAL KNEE ARTHROPLASTY Right 04/14/2023   Procedure: TOTAL KNEE ARTHROPLASTY;  Surgeon: Sheral Apley, MD;  Location: [facility];  Service: Orthopedics;  Laterality: Right;   Past Medical History:  Diagnosis Date   Asthma    Ht 5\' 4"  (1.626 m)   LMP 07/24/2023 (Approximate)   BMI 49.98 kg/m   Opioid Risk Score:   Fall Risk Score:   `1  Depression screen [facility] 2/9     08/24/2023   10:48 AM 08/13/2023    2:12 PM 05/21/2023    2:31 PM 05/07/2023   10:51 AM  Depression screen PHQ 2/9  Decreased Interest 0 0 0 0  Down, Depressed, Hopeless 0 0 1 3  PHQ - 2 Score 0 0 1 3  Altered sleeping  0  3  Tired, decreased energy  0  2  Change in appetite  0  2  Feeling bad or failure about yourself   0  2  Trouble concentrating  0  1  Moving slowly or fidgety/restless  0  0  Suicidal thoughts  0  0  PHQ-9 Score  0  13  Difficult doing work/chores  Not difficult at all        Review of Systems  Musculoskeletal:  Positive for back pain and gait problem.       Left hip pain,right knee pain   All other systems reviewed and are negative.      Objective:   Physical Exam  Gen: no distress, normal appearing HEENT: oral mucosa pink and moist, NCAT Chest: normal effort, normal rate of breathing Abd: soft, non-distended Ext: no edema Psych: pleasant, normal affect Skin: intact Neuro: Alert and awake, follows commands, cranial nerves II through XII grossly intact, normal speech and language Strength 5/5 in b/l UE and LE Sensory exam normal for light touch and pain in all 4 limbs. No limb ataxia or cerebellar signs. No abnormal tone appreciated.  No abnormal tone noted Musculoskeletal:  TTP R Knee joint line  Mild TTP L hip greater trochanter No pain with internal or external rotation L hip  Mild L spine TTP      Assessment & Plan:   1) R knee pain s/p TKA due to septic arthritis with osteomyelitis due to group B strep  2) L hip pain  -Likely associated with alerted gait due to L knee pain 3) Lower back pain, subacute  -Likely due to altered gait 4) Obesity  Body mass index is 49.98 kg/m. 5) HTN  -f/u with PCP     -Reviewed not using more than 1 tab meloxicam daily -Poor pain control with non-narcotic medications and treatments -Continue PT -UDS today -Pain agreement today -Continue norco 5 BID  PRN -Discussed foods that can be helpful for pain control -Continue UDS and pill counts.  Continue PDMP monitoring.  Pain contract completed prior visit. -Discussed bringing pill bottle with any medications even if empty to all appointments  -Count consider nerve blocks after injection is fully resolved -Pt forgot pill counts- discussed brining to each appointment -CBP product use with pos THC, pt reports she had discontinued use after last visit, UDS repeat today

## 2023-09-14 ENCOUNTER — Ambulatory Visit: Payer: Medicaid Other

## 2023-09-16 LAB — TOXASSURE SELECT,+ANTIDEPR,UR

## 2023-09-20 NOTE — Therapy (Signed)
 OUTPATIENT PHYSICAL THERAPY TREATMENT NOTE/DISCHARGE SUMMARY   Patient Name: Sharon Faulkner MRN: [id number] DOB:12/07/1975, 48 y.o., female Today's Date: 09/21/2023  END OF SESSION:  PT End of Session - 09/21/23 1700     Visit Number 10    Date for PT Re-Evaluation 08/22/23    Authorization Type MCD    PT Start Time 1650    PT Stop Time 1730    PT Time Calculation (min) 40 min    Activity Tolerance Patient tolerated treatment well    Behavior During Therapy [facility] for tasks assessed/performed                   Past Medical History:  Diagnosis Date   Asthma    Past Surgical History:  Procedure Laterality Date   INCISION AND DRAINAGE ABSCESS Right 04/14/2023   Procedure: INCISION AND DRAINAGE RIGHT KNEE;  Surgeon: Saundra Curl, MD;  Location: [facility];  Service: Orthopedics;  Laterality: Right;   KNEE ARTHROSCOPY Right 04/09/2023   Procedure: ARTHROSCOPY KNEE RIGHT;  Surgeon: Saundra Curl, MD;  Location: [facility] OR;  Service: Orthopedics;  Laterality: Right;   TOTAL KNEE ARTHROPLASTY Right 04/14/2023   Procedure: TOTAL KNEE ARTHROPLASTY;  Surgeon: Saundra Curl, MD;  Location: [facility];  Service: Orthopedics;  Laterality: Right;   Patient Active Problem List   Diagnosis Date Noted   Primary hypertension 05/07/2023   Concern for acute osteomyelitis of femoal condyle and tibial plateau (HCC) 04/19/2023   Obesity, Class III, BMI 40-49.9 (morbid obesity) (HCC) 04/19/2023   Asthma 04/09/2023   Acute on chronic anemia 04/09/2023   Septic arthritis of knee, 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 (HCC) 04/08/2023   Microcytic anemia 07/17/2020   Acute respiratory failure due to COVID-19 (HCC) 07/16/2020    PCP: Joaquin Mulberry, MD   REFERRING PROVIDER: Saundra Curl, MD  REFERRING DIAG: right knee total knee 04/15/23  THERAPY DIAG:  History of arthroplasty of right knee  Muscle weakness (generalized)  Acute pain of right knee  Rationale for Evaluation and  Treatment: Rehabilitation  ONSET DATE: 04/15/23  SUBJECTIVE:   SUBJECTIVE STATEMENT: 3-4/10 level of discomfort today, still unable to stand on her feet for long periods   PERTINENT HISTORY: HPI Ieesha Kristilyn Mcgiffin is a 48 y.o. 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   She presents with ongoing discomfort despite attempts at pain management. She describes the pain as an 'aching' sensation that worsens after being up all day. The pain is so severe that it disrupts her sleep, causing her to describe her leg as 'running around the bed.' The pain also affects her mobility, causing her to shift her weight onto her other leg, which in turn causes hip and back pain.   The patient has tried various pain medications, including codeine and hydrocodone , but has had adverse reactions to both. She has also tried methocarbamol  which has been ineffective.  At her last visit I had placed her on Duloxetine  but she found this to be intolerable due to side effects such as nausea. She continues to follow-up with her orthopedic and she did have a visit with infectious disease on 05/2023 and still remains on her antibiotic infusion. PAIN:  Are you having pain? Yes: NPRS scale:  10+/10 Pain location: R knee Pain description: ache, throb Aggravating factors: weightbearing, bending, movement Relieving factors: position changes, medication  PRECAUTIONS: Fall  RED FLAGS: None   WEIGHT BEARING RESTRICTIONS: No  FALLS:  Has patient fallen in last 6 months? No    OCCUPATION: not working  PLOF: Independent  PATIENT GOALS: To regain my knee function  NEXT MD VISIT: 06/25/23  OBJECTIVE:  Note: Objective measures were completed at  Evaluation unless otherwise noted.  DIAGNOSTIC FINDINGS: CLINICAL DATA:  Status post total right knee arthroplasty. Osteomyelitis of right knee region. Septic arthritis of right knee.   EXAM: PORTABLE RIGHT KNEE - 1-2 VIEW   COMPARISON:  Right knee radiographs 04/08/2023, MRI right knee 04/09/2023   FINDINGS: Interval total right knee arthroplasty with metallic femoral component and lower density tibial component. Antibiotic beads are seen throughout the tibial tunnel common femoral tunnel, and medial and suprapatellar compartment. Moderate joint effusion. Mild subcutaneous and intra-articular air. No cortical erosion is seen.   IMPRESSION: Interval total right knee arthroplasty, presumably for resection of the prior medial greater than lateral compartment articular surface septic arthritis/osteomyelitis, with antibiotic beads in place. Moderate joint effusion.     Electronically Signed   By: Bertina Broccoli M.D.   On: 04/14/2023 19:25  PATIENT SURVEYS:  FOTO 54(64 predicted)  08/24/23 42% (patient rates knee at 85%); 09/21/23 54%  MUSCLE LENGTH: N/T  POSTURE: N/T  PALPATION: N/T  LOWER EXTREMITY ROM:  A/PROM Right eval Left eval Right 07/07/23 Right 07/20/23 R 08/24/23 R 09/21/23  Hip flexion        Hip extension        Hip abduction        Hip adduction        Hip internal rotation        Hip external rotation        Knee flexion 70/82d  83 AAROM 94 AAROM 102d 110d  Knee extension -8/-4d    0d 0d  Ankle dorsiflexion        Ankle plantarflexion        Ankle inversion        Ankle eversion         (Blank rows = not tested)  LOWER EXTREMITY MMT:  MMT Right eval Left eval R 08/24/23 R 09/21/23  Hip flexion 3+  4- 4  Hip extension 3+  4- 4  Hip abduction 3+  4- 4  Hip adduction      Hip internal rotation      Hip external rotation      Knee flexion 3+  4- 4  Knee extension 3  3+ 4  Ankle dorsiflexion      Ankle plantarflexion 3+  4 4  Ankle  inversion      Ankle eversion       (Blank rows = not tested)  LOWER EXTREMITY SPECIAL TESTS: NT   FUNCTIONAL TESTS:  30 seconds chair stand test 3 reps; 08/24/23 8 reps  GAIT: Distance walked: 174ftx2 Assistive device utilized: Environmental consultant - 2 wheeled Level of assistance: Complete Independence Comments: slow antalgic gait   TODAY'S TREATMENT:  [facility] Adult PT Treatment:                                                DATE: 09/21/23 Therapeutic Exercise: Nustep L4 6 min  Therapeutic Activity: FOTO retake  Heel slides with PT OP 15x f/b 5x10 OP 110d flexion 16 steps with step through pattern  Self Care: Discussion of needs to address following DC, updated HEP to address progress and ongoing needs and strategies for self management.  Instructed to f/u with further PT in 30 days if need persists.    [facility] Adult PT Treatment:                                                DATE: 09/07/23 Therapeutic Exercise: Nustep L2 8 min FAQs w/ball 15x2 R SLR 15x 2# R SAQs 15x2 3# Heel slides 15x2 (5 reps with PT OP) 108-110d TKE GTB 15x Supine hip fallouts GTB 15x2   [facility] Adult PT Treatment:                                                DATE: 08/31/23 Therapeutic Exercise: NuStep L5 6 min FAQs w/ball 15x R SLR 15x 2# R SAQs 15x 2# Heel slides 15x2 (5 reps with PT OP) 105d TKE GTB 15x 5x STS  [facility] Adult PT Treatment:                                                DATE: 08/24/23 Therapeutic Exercise: Nustep L4 8 min Heel slides over board 15x2 102d flexion SLR 15x  Therapeutic Activity: 12 steps emphasizing step to pattern for safety 30s chair stand test 8 reps  [facility] Adult PT Treatment:                                                DATE: 08/10/2023  Therapeutic Exercise: NuStep L4 6 min R FAQs 2# 15x R QS 3s 15x R SLR 15x 2s 2# R SAQs 15x 2s Heel slides on plinth w/ stretch strap FAQ 2# 15x 2s Standing heel raises 15x  Manual Therapy: Manual knee flexion with  distraction Patellar mobilization Desensitization training  Modalities: Ice pack with elevation  [facility] Adult PT Treatment:                                                DATE: 08/03/23 Therapeutic Exercise: NuStep L4 6 min R FAQs 2# 15x R QS 3s 15x R SLR 15x 2s 2# R SAQs 15x 2s Heel slides over board w/strap 15x 98d FAQ 2# 15x 2s Standing heel raises 15x Standing ham curls Manual Therapy: STM into flexion in sitting   PATIENT EDUCATION:  Education details: Discussed eval findings, rehab rationale and POC and patient is in agreement  Person educated: Patient Education method: Explanation Education comprehension: verbalized understanding and needs further education  HOME EXERCISE PROGRAM: Access Code: [email or web address] URL: [email or web address] Date: 09/21/2023 Prepared by: Gretta Leavens  Exercises - Supine Heel Slide with Strap  - 5 x daily - 5 x weekly - 1 sets - 15  reps - Single Leg Heel Raise with Chair Support  - 5 x daily - 5 x weekly - 1 sets - 15 reps - Sit to Stand with Arms Crossed  - 5 x daily - 5 x weekly - 1 sets - 10 reps  ASSESSMENT:  CLINICAL IMPRESSION: Rehab goals met   OBJECTIVE IMPAIRMENTS: Abnormal gait, decreased activity tolerance, decreased mobility, difficulty walking, decreased ROM, decreased strength, obesity, and pain.   ACTIVITY LIMITATIONS: carrying, lifting, sitting, standing, squatting, stairs, and locomotion level  PERSONAL FACTORS: Fitness, Past/current experiences, and 1 comorbidity: infection  are also affecting patient's functional outcome.   REHAB POTENTIAL: Good  CLINICAL DECISION MAKING: Evolving/moderate complexity  EVALUATION COMPLEXITY: Low   GOALS: Goals reviewed with patient? No  SHORT TERM GOALS: Target date: 07/13/2023   Patient to demonstrate independence in HEP  Baseline: [email or web address] Goal status: Met  2.  Increase R knee flexion to 90d Baseline: 82d; 07/13/23 95d Goal status: Met  3.  Able to perform  SLR w/o extensor lag Baseline: Extensor lag present; 08/31/23 No extensor lag Goal status: Met   LONG TERM GOALS: Target date: 08/03/2023    Patient will score at least 64% on FOTO to signify clinically meaningful improvement in functional abilities.   Baseline: 54%; 08/24/23 42%; 09/21/23 56% Goal status: Ongoing  2.  Patient will increase 30s chair stand reps from 3 to 6 with/without arms to demonstrate and improved functional ability with less pain/difficulty as well as reduce fall risk.  Baseline: 3; 08/24/23 8 reps Goal status: Met  3.  Patient will acknowledge 6/10 pain at least once during episode of care   Baseline: 10/10; 09/21/23 3-4/10 pain/soreness  Goal status: Met  4.  212ft ambulation with LRAD Baseline: 174ft with RW; 08/24/23 Unrestricted w/o AD Goal status: Met  5.  Patient to negotiate 16 steps with most appropriate pattern Baseline: TBD; 08/24/23 12 steps with B rails and step to pattern; 09/21/23 16 steps with reciprocal  Goal status: Met  6.  Increase R knee flexion to 110d Baseline: 82d; 08/24/23 102; 09/21/23 110d Goal status: Met   PLAN:  PT FREQUENCY: 1x/week  PT DURATION: 6 weeks  PLANNED INTERVENTIONS: 97164- PT Re-evaluation, 97110-Therapeutic exercises, 97530- Therapeutic activity, 97112- Neuromuscular re-education, 97535- Self Care, 16109- Manual therapy, 97116- Gait training, 97014- Electrical stimulation (unattended), Patient/Family education, Balance training, Stair training, Joint mobilization, Cryotherapy, and Moist heat  PLAN FOR NEXT SESSION: HEP review and update, manual techniques as appropriate, aerobic tasks, ROM and flexibility activities, strengthening and PREs, TPDN, gait and balance training as needed   For all possible CPT codes, reference the Planned Interventions line above.     Check all conditions that are expected to impact treatment: {Conditions expected to impact treatment:Unknown   If treatment provided at initial evaluation,  no treatment charged due to lack of authorization.      Jeff Nathan [place] PT  09/21/2023, 5:47 PM

## 2023-09-21 ENCOUNTER — Ambulatory Visit: Payer: Medicaid Other | Attending: Orthopedic Surgery

## 2023-09-21 DIAGNOSIS — M25561 Pain in right knee: Secondary | ICD-10-CM | POA: Diagnosis present

## 2023-09-21 DIAGNOSIS — M6281 Muscle weakness (generalized): Secondary | ICD-10-CM | POA: Insufficient documentation

## 2023-09-21 DIAGNOSIS — Z96651 Presence of right artificial knee joint: Secondary | ICD-10-CM | POA: Diagnosis present

## 2023-10-05 ENCOUNTER — Other Ambulatory Visit: Payer: Self-pay

## 2023-10-05 ENCOUNTER — Telehealth: Payer: Self-pay

## 2023-10-05 ENCOUNTER — Encounter: Payer: Self-pay | Admitting: Internal Medicine

## 2023-10-05 ENCOUNTER — Ambulatory Visit (INDEPENDENT_AMBULATORY_CARE_PROVIDER_SITE_OTHER): Payer: Medicaid Other | Admitting: Internal Medicine

## 2023-10-05 VITALS — BP 164/96 | HR 88 | Ht 64.0 in | Wt 297.0 lb

## 2023-10-05 DIAGNOSIS — Z79899 Other long term (current) drug therapy: Secondary | ICD-10-CM

## 2023-10-05 DIAGNOSIS — I10 Essential (primary) hypertension: Secondary | ICD-10-CM

## 2023-10-05 DIAGNOSIS — B372 Candidiasis of skin and nail: Secondary | ICD-10-CM | POA: Diagnosis not present

## 2023-10-05 DIAGNOSIS — M00261 Other streptococcal arthritis, right knee: Secondary | ICD-10-CM | POA: Diagnosis present

## 2023-10-05 MED ORDER — FLUCONAZOLE 150 MG PO TABS: 150.0000 mg | ORAL_TABLET | ORAL | 0 refills | Status: AC

## 2023-10-05 MED ORDER — METHOCARBAMOL 500 MG PO TABS: 500.0000 mg | ORAL_TABLET | Freq: Four times a day (QID) | ORAL | 1 refills | Status: AC | PRN

## 2023-10-05 MED ORDER — MELOXICAM 15 MG PO TABS: 15.0000 mg | ORAL_TABLET | Freq: Every day | ORAL | 0 refills | Status: AC | PRN

## 2023-10-05 NOTE — Progress Notes (Signed)
 Patient ID: Sharon Faulkner, female   DOB: 08-17-1975, 48 y.o.   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 She has been on cefadroxil since completion on iv abtx. She is tolerating abtx. Not missing a dose.toherwsie doing well  Lab Results  Component Value Date   ESRSEDRATE 6 08/24/2023   Lab Results  Component Value Date   CRP 3.7 08/24/2023     Outpatient Encounter Medications as of 10/05/2023  Medication Sig   acetaminophen (TYLENOL) 500 MG tablet Take 2 tablets (1,000 mg total) by mouth in the morning, at noon, and at bedtime.   albuterol (VENTOLIN HFA) 108 (90 Base) MCG/ACT inhaler Inhale 2 puffs into the lungs every 6 (six) hours as needed for wheezing or shortness of breath.   amLODipine (NORVASC) 5 MG tablet Take 1 tablet (5 mg total) by mouth daily.   cefadroxil (DURICEF) 500 MG capsule Take 2 capsules (1,000 mg total) by mouth 2 (two) times daily.   ferrous sulfate 325 (65 FE) MG tablet Take 1 tablet (325 mg total) by mouth every other day.   HYDROcodone-acetaminophen (NORCO/VICODIN) 5-325 MG tablet Take 1 tablet by mouth every 12 (twelve) hours as needed for moderate pain (pain score 4-6).   meloxicam (MOBIC) 15 MG tablet Take 15 mg by mouth daily as needed.   methocarbamol (ROBAXIN) 500 MG tablet Take 1 tablet (500 mg total) by mouth every 6 (six) hours as needed for muscle spasms.   No facility-administered encounter medications on file as of 10/05/2023.     Patient Active Problem List   Diagnosis Date Noted   Primary hypertension 05/07/2023   Concern for acute osteomyelitis of femoal condyle and tibial plateau (HCC) 04/19/2023   Obesity, Class III, BMI 40-49.9 (morbid obesity) (HCC) 04/19/2023   Asthma 04/09/2023   Acute on chronic anemia 04/09/2023   Septic arthritis of knee, right due to Group B  streptococcus (HCC) 04/08/2023   Microcytic anemia 07/17/2020   Acute respiratory failure due to COVID-19 [facility]) 07/16/2020     Health Maintenance Due  Topic Date Due   COVID-19 Vaccine (1) Never done   Pneumococcal Vaccine 91-26 Years old (1 of 2 - PCV) Never done   Hepatitis C Screening  Never done   Cervical Cancer Screening (HPV/Pap Cotest)  Never done   Colonoscopy  Never done     Review of Systems 12 point ros is negative Physical Exam   BP (!) 193/115   Pulse 88   Ht 5\' 4"  (1.626 m)   Wt 297 lb (134.7 kg)   BMI 50.98 kg/m    Physical Exam  Constitutional:  oriented to person, place, and time. appears well-developed and well-nourished. No distress.  HENT: [place]/AT, PERRLA, no scleral icterus Mouth/Throat: Oropharynx is clear and moist. No oropharyngeal exudate.  Cardiovascular: Normal rate, regular rhythm and normal heart sounds. Exam reveals no gallop and no friction rub.  No murmur heard.  Pulmonary/Chest: Effort normal and breath sounds normal. No respiratory distress.  has no wheezes.  Neck = supple, no nuchal rigidity Abdominal: Soft. Bowel sounds are normal.  exhibits no distension. There is no tenderness.  Lymphadenopathy: no cervical adenopathy. No axillary adenopathy Neurological: alert and oriented to person, place, and time.  Skin: Skin is warm and dry. No rash noted. No  erythema.  Psychiatric: a normal mood and affect.  behavior is normal.    CBC Lab Results  Component Value Date   WBC 8.7 05/21/2023   RBC 4.18 05/21/2023   HGB 9.3 (L) 05/21/2023   HCT 32.1 (L) 05/21/2023   PLT 378 05/21/2023   MCV 76.8 (L) 05/21/2023   MCH 22.2 (L) 05/21/2023   MCHC 29.0 (L) 05/21/2023   RDW 19.8 (H) 05/21/2023   LYMPHSABS 1,531 05/21/2023   MONOABS 0.7 04/19/2023   EOSABS 531 (H) 05/21/2023    BMET Lab Results  Component Value Date   NA 140 05/21/2023   K 4.2 05/21/2023   CL 104 05/21/2023   CO2 27 05/21/2023   GLUCOSE 100 (H) 05/21/2023   BUN 8  05/21/2023   CREATININE 0.54 05/21/2023   CALCIUM 8.9 05/21/2023   GFRNONAA >60 04/19/2023      Assessment and Plan  Finish out 2 more weeks of abtx to complete course. No further abtx needed Yeast infection = will give fluconazole to candidiasis Htn = will recheck. If still elevated. Recommend to follow up with her pcp  Knee pain = Gave robaxin and meloxicam rx

## 2023-10-05 NOTE — Telephone Encounter (Signed)
 Patient calling asking about test results.

## 2023-10-29 ENCOUNTER — Encounter: Payer: Self-pay | Admitting: Physical Medicine & Rehabilitation

## 2023-10-29 ENCOUNTER — Encounter: Payer: Medicaid Other | Attending: Physical Medicine & Rehabilitation | Admitting: Physical Medicine & Rehabilitation

## 2023-10-29 VITALS — BP 150/95 | HR 81 | Ht 64.0 in | Wt 299.0 lb

## 2023-10-29 DIAGNOSIS — Z79899 Other long term (current) drug therapy: Secondary | ICD-10-CM | POA: Diagnosis present

## 2023-10-29 DIAGNOSIS — E669 Obesity, unspecified: Secondary | ICD-10-CM | POA: Insufficient documentation

## 2023-10-29 DIAGNOSIS — G894 Chronic pain syndrome: Secondary | ICD-10-CM | POA: Insufficient documentation

## 2023-10-29 DIAGNOSIS — Z96651 Presence of right artificial knee joint: Secondary | ICD-10-CM | POA: Insufficient documentation

## 2023-10-29 DIAGNOSIS — G8929 Other chronic pain: Secondary | ICD-10-CM | POA: Diagnosis present

## 2023-10-29 DIAGNOSIS — I10 Essential (primary) hypertension: Secondary | ICD-10-CM | POA: Diagnosis present

## 2023-10-29 DIAGNOSIS — M545 Low back pain, unspecified: Secondary | ICD-10-CM | POA: Insufficient documentation

## 2023-10-29 DIAGNOSIS — M25561 Pain in right knee: Secondary | ICD-10-CM | POA: Diagnosis not present

## 2023-10-29 DIAGNOSIS — M25552 Pain in left hip: Secondary | ICD-10-CM | POA: Diagnosis not present

## 2023-10-29 MED ORDER — HYDROCODONE-ACETAMINOPHEN 5-325 MG PO TABS: 1.0000 | ORAL_TABLET | Freq: Two times a day (BID) | ORAL | 0 refills | Status: AC | PRN

## 2023-10-29 NOTE — Progress Notes (Signed)
 Subjective:    Patient ID: Sharon Faulkner, female    DOB: [birth year], 48 y.o.   MRN: [id number]  HPI   HPI  Sharon Faulkner is a 48 y.o. year old female  who  has a past medical history of Asthma.   They are presenting to PM&R clinic as a new patient for pain management evaluation. They were referred by PA Levester Fresh for treatment of R knee pain.  Patient reports she developed knee pain around July 2024.  Right knee pain continue to progress to where she required a wheelchair for ambulation.  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  Patient was taken to the OR for irrigation and debridement by Dr. Dr. Margarita Rana 04/14/2023 but ultimately required TKA and antibiotic bead placement.  She has been working with physical therapy.  She continues to have severe pain in her right knee, although it is gradually improving.  During this time she has also developed milder pain in her left hip and lower back.  She feels like this is due to her altered gait.  She has been doing physical therapy for the last several weeks, reports this is helping her function.  Knee pain is less severe when she is sitting still but becomes very severe when ambulating.  Pain is sharp in quality.  Sometimes associated with muscle spasms in her shins.  Patient has been using meloxicam, this provides mild benefit but she reports she has to take more than 1 pill so really keep the pain at a manageable level.  Robaxin also helps her pain.  She is previously treated with oxycodone, hydrocodone, tramadol.  She reports tramadol did not help her pain.  Hydrocodone and oxycodone helped reduce her pain.  She denies any side effects with pain medications ending with "codon" but says that codeine caused her to itch.   Pt did report use of CBD product she purchased at a store recently.   Red flag symptoms: No red flags for back pain endorsed in Hx or ROS  Medications tried: Nsaids-  Meloxicam- helps a little  Tylenol  minimal benefit Opiates   Dilaudid- worked well  Oxycodone and Hydrocodone- helpful  Codeine- itching Tramadol- dizzy and sick Gabapentin- gabapentin didn't help  SNRIs  -  Duloxetine made her feel nauseate  Robaxin 500mg  makes her sleepy but does provide benefit  Other treatments: PT- Helping her function  TENs unit Has not tried Surgery RKA    Prior UDS results: No results found for: "LABOPIA", "COCAINSCRNUR", "LABBENZ", "AMPHETMU", "THCU", "LABBARB"   Interval History 09/11/23 Pt continues to have pain in her R knee. Norco helping to keep pain under control. She is using it sparingly.  No side effects with the medication. Continues to have occasional hip pain.  She is working with PT.    Interval History 09/11/23 Right knee pain overall unchanged from prior visit however intensity is a little improved.  She completed formal physiotherapy and is working on home exercises.  She has occasional hip pain as well. She reports her weight has increased which is negatively impacting her pain.  She is try to work on weight loss but it has been difficult.  Norco 5 mg twice daily as needed continues to help keep her pain controlled.  She denies any side effects with medication.   Pain Inventory Average Pain 7 Pain Right Now 4 My pain is sharp, burning, stabbing, aching, and tightness  In  the last 24 hours, has pain interfered with the following? General activity 6 Relation with others 0 Enjoyment of life 5 What TIME of day is your pain at its worst? evening and night Sleep (in general)  poor-fair  Pain is worse with: walking, bending, standing, and some activites Pain improves with: rest and medication Relief from Meds: 8    Any changes since last visit?  no    History reviewed. No pertinent family history. Social History   Socioeconomic History   Marital status: Single    Spouse name: Not on file   Number of children: Not on file    Years of education: Not on file   Highest education level: Not on file  Occupational History   Not on file  Tobacco Use   Smoking status: Every Day    Current packs/day: 0.50    Types: Cigarettes   Smokeless tobacco: Never  Vaping Use   Vaping status: Some Days   Substances: Nicotine, Flavoring  Substance and Sexual Activity   Alcohol use: Not Currently    Comment: occasional   Drug use: No   Sexual activity: Yes  Other Topics Concern   Not on file  Social History Narrative   Not on file   Social Drivers of Health   Financial Resource Strain: High Risk (06/08/2023)   Overall Financial Resource Strain (CARDIA)    Difficulty of Paying Living Expenses: Hard  Food Insecurity: Food Insecurity Present (06/08/2023)   Hunger Vital Sign    Worried About Running Out of Food in the Last Year: Often true    Ran Out of Food in the Last Year: Sometimes true  Transportation Needs: No Transportation Needs (06/08/2023)   PRAPARE - Administrator, Civil Service (Medical): No    Lack of Transportation (Non-Medical): No  Physical Activity: Inactive (06/08/2023)   Exercise Vital Sign    Days of Exercise per Week: 0 days    Minutes of Exercise per Session: 0 min  Stress: Stress Concern Present (06/08/2023)   [employer] of Occupational Health - Occupational Stress Questionnaire    Feeling of Stress : To some extent  Social Connections: Unknown (06/08/2023)   Social Connection and Isolation Panel [NHANES]    Frequency of Communication with Friends and Family: Three times a week    Frequency of Social Gatherings with Friends and Family: Once a week    Attends Religious Services: Never    Database administrator or Organizations: No    Attends Engineer, structural: Never    Marital Status: Patient declined   Past Surgical History:  Procedure Laterality Date   INCISION AND DRAINAGE ABSCESS Right 04/14/2023   Procedure: INCISION AND DRAINAGE RIGHT KNEE;  Surgeon:  Sheral Apley, MD;  Location: [facility];  Service: Orthopedics;  Laterality: Right;   KNEE ARTHROSCOPY Right 04/09/2023   Procedure: ARTHROSCOPY KNEE RIGHT;  Surgeon: Sheral Apley, MD;  Location: [facility] OR;  Service: Orthopedics;  Laterality: Right;   TOTAL KNEE ARTHROPLASTY Right 04/14/2023   Procedure: TOTAL KNEE ARTHROPLASTY;  Surgeon: Sheral Apley, MD;  Location: [facility];  Service: Orthopedics;  Laterality: Right;   Past Medical History:  Diagnosis Date   Asthma    Ht 5\' 4"  (1.626 m)   BMI 50.98 kg/m   Opioid Risk Score:   Fall Risk Score:  `1  Depression screen [facility] 2/9     10/05/2023   11:03 AM 09/11/2023    2:44 PM 08/24/2023  10:48 AM 08/13/2023    2:12 PM 05/21/2023    2:31 PM 05/07/2023   10:51 AM  Depression screen PHQ 2/9  Decreased Interest 0 0 0 0 0 0  Down, Depressed, Hopeless 0 0 0 0 1 3  PHQ - 2 Score 0 0 0 0 1 3  Altered sleeping    0  3  Tired, decreased energy    0  2  Change in appetite    0  2  Feeling bad or failure about yourself     0  2  Trouble concentrating    0  1  Moving slowly or fidgety/restless    0  0  Suicidal thoughts    0  0  PHQ-9 Score    0  13  Difficult doing work/chores    Not difficult at all        Review of Systems  Musculoskeletal:  Positive for back pain and gait problem.       Left hip pain,right knee pain   All other systems reviewed and are negative.      Objective:   Physical Exam  Gen: no distress, normal appearing HEENT: oral mucosa pink and moist, NCAT Chest: normal effort, normal rate of breathing Abd: soft, non-distended Ext: no edema Psych: pleasant, normal affect Skin: intact Neuro: Alert and awake, follows commands, cranial nerves II through XII grossly intact, normal speech and language Moving all 4 extremities to gravity and resistance Sensation intact light touch in all 4 extremities No abnormal tone noted Musculoskeletal:  Right knee TTP at the joint line, more medially Mild TTP left greater  trochanter and L-spine bilateral paraspinal muscles      Assessment & Plan:   1) 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 5) HTN  -BP elevated today   -f/u with PCP recommended     -Reviewed not using more than 1 tab meloxicam daily -Poor pain control with non-narcotic medications and treatments -Work with PT seen recently, now working on home exercise -Continue norco 5 BID PRN -Discussed foods that can be helpful for pain control -Continue UDS and pill counts.  Continue PDMP monitoring.  Pain contract completed prior visit. -Discussed bringing pill bottle with any medications even if empty to all appointments  -Count consider nerve blocks after injection is fully resolved -Pt forgot pill counts at a prior visit, consistent today -CBP product use with pos THC, pt reports she had discontinued use after she was told to prior visit, UDS repeat was consistent -Referred to nutrition therapy to help with weight loss -List of foods that can help with weight loss provide

## 2023-11-06 ENCOUNTER — Other Ambulatory Visit: Payer: Self-pay | Admitting: Internal Medicine

## 2023-11-06 ENCOUNTER — Other Ambulatory Visit: Payer: Self-pay | Admitting: Family Medicine

## 2023-11-29 ENCOUNTER — Other Ambulatory Visit: Payer: Self-pay | Admitting: Family Medicine

## 2023-12-04 ENCOUNTER — Other Ambulatory Visit: Payer: Self-pay | Admitting: Internal Medicine

## 2023-12-14 ENCOUNTER — Ambulatory Visit: Payer: Medicaid Other | Admitting: Internal Medicine

## 2023-12-17 ENCOUNTER — Ambulatory Visit: Admitting: Dietician

## 2023-12-29 ENCOUNTER — Other Ambulatory Visit: Payer: Self-pay | Admitting: Internal Medicine

## 2023-12-30 NOTE — Telephone Encounter (Signed)
 Patient has PCP.   Chaska Hagger, BSN, RN

## 2023-12-31 ENCOUNTER — Encounter: Attending: Physical Medicine & Rehabilitation | Admitting: Physical Medicine & Rehabilitation

## 2023-12-31 DIAGNOSIS — G894 Chronic pain syndrome: Secondary | ICD-10-CM | POA: Insufficient documentation

## 2023-12-31 DIAGNOSIS — Z5181 Encounter for therapeutic drug level monitoring: Secondary | ICD-10-CM | POA: Insufficient documentation

## 2023-12-31 DIAGNOSIS — Z79899 Other long term (current) drug therapy: Secondary | ICD-10-CM | POA: Insufficient documentation

## 2024-04-20 ENCOUNTER — Telehealth: Payer: Self-pay

## 2024-04-20 MED ORDER — ALBUTEROL SULFATE HFA 108 (90 BASE) MCG/ACT IN AERS: 2.0000 | INHALATION_SPRAY | Freq: Four times a day (QID) | RESPIRATORY_TRACT | 1 refills | Status: AC | PRN

## 2024-04-20 NOTE — Telephone Encounter (Signed)
 Refill has been sent to the pharmacy.

## 2024-04-20 NOTE — Telephone Encounter (Signed)
 Patient is requesting refills on albuterol (VENTOLIN HFA) 108 (90 Base) MCG/ACT inhaler

## 2024-04-20 NOTE — Telephone Encounter (Signed)
 Routing to PCP, patient has not had a visit recently.

## 2024-04-21 NOTE — Telephone Encounter (Signed)
Patient has been informed of refill
